# Patient Record
Sex: Female | Born: 1970
Health system: Southern US, Community
[De-identification: ages and names within clinical notes are randomized; demographics above are authoritative.]

## PROBLEM LIST (undated history)

## (undated) DIAGNOSIS — D649 Anemia, unspecified: Secondary | ICD-10-CM

## (undated) DIAGNOSIS — F32A Depression, unspecified: Secondary | ICD-10-CM

## (undated) DIAGNOSIS — J45909 Unspecified asthma, uncomplicated: Secondary | ICD-10-CM

## (undated) DIAGNOSIS — J189 Pneumonia, unspecified organism: Secondary | ICD-10-CM

## (undated) DIAGNOSIS — E119 Type 2 diabetes mellitus without complications: Secondary | ICD-10-CM

## (undated) DIAGNOSIS — J40 Bronchitis, not specified as acute or chronic: Secondary | ICD-10-CM

## (undated) DIAGNOSIS — D219 Benign neoplasm of connective and other soft tissue, unspecified: Secondary | ICD-10-CM

## (undated) DIAGNOSIS — N189 Chronic kidney disease, unspecified: Secondary | ICD-10-CM

## (undated) DIAGNOSIS — F419 Anxiety disorder, unspecified: Secondary | ICD-10-CM

## (undated) DIAGNOSIS — I10 Essential (primary) hypertension: Secondary | ICD-10-CM

## (undated) HISTORY — DX: Unspecified asthma, uncomplicated: J45.909

## (undated) HISTORY — DX: Essential (primary) hypertension: I10

## (undated) HISTORY — DX: Benign neoplasm of connective and other soft tissue, unspecified: D21.9

## (undated) HISTORY — DX: Bronchitis, not specified as acute or chronic: J40

---

## 2001-12-10 ENCOUNTER — Encounter: Payer: Self-pay | Admitting: Urology

## 2001-12-10 ENCOUNTER — Ambulatory Visit (HOSPITAL_COMMUNITY): Admission: RE | Admit: 2001-12-10 | Discharge: 2001-12-10 | Payer: Self-pay | Admitting: Urology

## 2002-05-21 ENCOUNTER — Ambulatory Visit (HOSPITAL_COMMUNITY): Admission: RE | Admit: 2002-05-21 | Discharge: 2002-05-21 | Payer: Self-pay | Admitting: Obstetrics & Gynecology

## 2002-05-21 ENCOUNTER — Encounter (INDEPENDENT_AMBULATORY_CARE_PROVIDER_SITE_OTHER): Payer: Self-pay | Admitting: *Deleted

## 2002-07-23 ENCOUNTER — Other Ambulatory Visit: Admission: RE | Admit: 2002-07-23 | Discharge: 2002-07-23 | Payer: Self-pay | Admitting: Obstetrics & Gynecology

## 2005-03-22 ENCOUNTER — Ambulatory Visit (HOSPITAL_COMMUNITY): Admission: RE | Admit: 2005-03-22 | Discharge: 2005-03-22 | Payer: Self-pay | Admitting: Family Medicine

## 2005-04-23 ENCOUNTER — Ambulatory Visit (HOSPITAL_COMMUNITY): Admission: RE | Admit: 2005-04-23 | Discharge: 2005-04-23 | Payer: Self-pay | Admitting: Family Medicine

## 2006-02-12 DIAGNOSIS — J45909 Unspecified asthma, uncomplicated: Secondary | ICD-10-CM

## 2006-02-12 HISTORY — DX: Unspecified asthma, uncomplicated: J45.909

## 2006-12-18 ENCOUNTER — Emergency Department (HOSPITAL_COMMUNITY): Admission: EM | Admit: 2006-12-18 | Discharge: 2006-12-19 | Payer: Self-pay | Admitting: *Deleted

## 2010-03-05 ENCOUNTER — Encounter: Payer: Self-pay | Admitting: Urology

## 2010-06-30 NOTE — Op Note (Signed)
   NAME:  Sandra Gray, Sandra Gray                        ACCOUNT NO.:  [id number]   MEDICAL RECORD NO.:  [id number]                   PATIENT TYPE:  AMB   LOCATION:  SDC                                  FACILITY:  [facility]   PHYSICIAN:  Genia Del, M.D.             DATE OF BIRTH:  14-May-1970   DATE OF PROCEDURE:  05/21/2002  DATE OF DISCHARGE:                                 OPERATIVE REPORT   PREOPERATIVE DIAGNOSIS:  Seven plus weeks gestation, nondesired.   POSTOPERATIVE DIAGNOSIS:  Seven plus weeks gestation, nondesired.   PROCEDURE:  Intervention, dilatation and evacuation.   SURGEON:  Genia Del, M.D.   ANESTHESIOLOGIST:  Belva Agee, M.D.   DESCRIPTION OF PROCEDURE:  Under MAC analgesia the patient was in the  lithotomy position.  She was prepped with Hibiclens on the suprapubic vulvar  and vaginal areas.  The bladder is catheterized and the patient is draped as  usual.  The vaginal exam reveals an anteverted uterus 7-[redacted] weeks gestation,  mobile, no adnexal masses.  The cervix is closed and long, no bleeding.   The speculum is inserted.  A paracervical block is done with lidocaine 1%  plain, 20 cc total at 4 and 8 o'clock.  The anterior lip of the cervix is  grasped with a tenaculum. Dilatation is achieved with Hegar up to #29  without difficulty.  We then proceeded with suction of the intrauterine  cavity with a #8 curved curette.  Products of conception corresponding to  about 7-[redacted] weeks gestation are brought out and sent to pathology.  We then  used sharp curettes to systematically curettage the entire intrauterine  cavity.  The uterine sound is heard all over.  We then used the suction  curette once more to remove any debris and any blood clots.  The uterus is  contracting very well.  Hemostasis is adequate.   We removed the instruments. Silver nitrate is applied on the anterior lip of  the cervix where the clamp was, to control minor bleeding with success.  The  uterus is contracting well on vaginal exam after the intervention.  The  estimated blood loss was less than 50 cc.  No complications occurred and the  patient was transferred to the recovery room in good status. Her blood group  is A positive.                                               Genia Del, M.D.    ML/MEDQ  D:  05/21/2002  T:  05/22/2002  Job:  [id number]

## 2010-11-21 LAB — CBC
MCHC: 32.4
MCV: 79.8
Platelets: 297
RBC: 4.89
RDW: 13.3

## 2010-11-21 LAB — BASIC METABOLIC PANEL
CO2: 23
Calcium: 9.1
Chloride: 109
Creatinine, Ser: 1.29 — ABNORMAL HIGH
GFR calc Af Amer: 58 — ABNORMAL LOW
Glucose, Bld: 97
Potassium: 3.8
Sodium: 138

## 2010-11-21 LAB — URINALYSIS, ROUTINE W REFLEX MICROSCOPIC
Ketones, ur: NEGATIVE
Nitrite: NEGATIVE
Urobilinogen, UA: 0.2
pH: 7

## 2010-11-21 LAB — DIFFERENTIAL
Eosinophils Relative: 1
Lymphs Abs: 1.6
Monocytes Absolute: 0.8 — ABNORMAL HIGH
Monocytes Relative: 6
Neutro Abs: 9.6 — ABNORMAL HIGH

## 2011-02-13 DIAGNOSIS — I10 Essential (primary) hypertension: Secondary | ICD-10-CM

## 2011-02-13 HISTORY — DX: Essential (primary) hypertension: I10

## 2011-08-13 HISTORY — PX: DILATION AND CURETTAGE OF UTERUS: SHX78

## 2011-08-21 ENCOUNTER — Ambulatory Visit: Payer: Self-pay | Admitting: Obstetrics and Gynecology

## 2011-08-21 LAB — HEMOGLOBIN: HGB: 11.9 g/dL — ABNORMAL LOW (ref 12.0–16.0)

## 2012-02-12 ENCOUNTER — Ambulatory Visit: Payer: Self-pay | Admitting: Obstetrics and Gynecology

## 2012-02-12 LAB — URINALYSIS, COMPLETE
Bilirubin,UR: NEGATIVE
Leukocyte Esterase: NEGATIVE
Ph: 5 (ref 4.5–8.0)
Protein: 30
RBC,UR: 2520 /HPF (ref 0–5)

## 2012-02-15 ENCOUNTER — Ambulatory Visit: Payer: Self-pay | Admitting: Obstetrics and Gynecology

## 2012-02-15 HISTORY — PX: HYSTEROSCOPY: SHX211

## 2012-03-26 ENCOUNTER — Encounter: Payer: Self-pay | Admitting: Adult Health

## 2012-03-26 ENCOUNTER — Ambulatory Visit (INDEPENDENT_AMBULATORY_CARE_PROVIDER_SITE_OTHER): Payer: 59 | Admitting: Adult Health

## 2012-03-26 VITALS — BP 160/99 | HR 104 | Temp 98.6°F | Resp 16 | Ht 61.75 in | Wt 201.0 lb

## 2012-03-26 DIAGNOSIS — R5381 Other malaise: Secondary | ICD-10-CM

## 2012-03-26 DIAGNOSIS — F32A Depression, unspecified: Secondary | ICD-10-CM | POA: Insufficient documentation

## 2012-03-26 DIAGNOSIS — I10 Essential (primary) hypertension: Secondary | ICD-10-CM

## 2012-03-26 DIAGNOSIS — J45909 Unspecified asthma, uncomplicated: Secondary | ICD-10-CM

## 2012-03-26 DIAGNOSIS — F329 Major depressive disorder, single episode, unspecified: Secondary | ICD-10-CM | POA: Insufficient documentation

## 2012-03-26 DIAGNOSIS — F411 Generalized anxiety disorder: Secondary | ICD-10-CM

## 2012-03-26 DIAGNOSIS — F419 Anxiety disorder, unspecified: Secondary | ICD-10-CM

## 2012-03-26 DIAGNOSIS — R5383 Other fatigue: Secondary | ICD-10-CM

## 2012-03-26 DIAGNOSIS — Z0000 Encounter for general adult medical examination without abnormal findings: Secondary | ICD-10-CM | POA: Insufficient documentation

## 2012-03-26 MED ORDER — ALBUTEROL SULFATE HFA 108 (90 BASE) MCG/ACT IN AERS: 2.0000 | INHALATION_SPRAY | Freq: Four times a day (QID) | RESPIRATORY_TRACT | Status: DC | PRN

## 2012-03-26 MED ORDER — METHYLDOPA 250 MG PO TABS: ORAL_TABLET | ORAL | Status: DC

## 2012-03-26 NOTE — Assessment & Plan Note (Signed)
Will check TSH given patient's report of fatigue. She has also reported hair loss.

## 2012-03-26 NOTE — Assessment & Plan Note (Signed)
Start methyldopa 250 mg bid. Monitor b/p x 2 weeks. RTC for f/u in 1-2 months or sooner if not well controlled.

## 2012-03-26 NOTE — Assessment & Plan Note (Signed)
Normal exam. Patient seen by GYN for breast and pelvic including PAP. Ordered labs: cbc w/diff, cmet, lipid panel, TSH (report of fatigue).

## 2012-03-26 NOTE — Progress Notes (Signed)
  Subjective:    Patient ID: Sandra Gray, female    DOB: 1970-02-23, 42 y.o.   MRN: [id number]  HPI  Ms. Sandra Sandra Batten) is a very pleasant 42 y/o female who is here to establish care. She was previously followed in [place] by Dr. Butch Penny. She is also followed by Dr. Madelin Headings (GYN). Patient reports that she was seen in Urgent Care at [facility] for Bronchitis in July 2013. Sandra Batten reports she has a hx of HTN (08/2011), asthma with bronchitis. She reports having a miscarriage in July 2013 and is s/p D&C. She also had a very large fibroid removed January 2014.  Sandra Gray reports she was started on Lisinopril 40 mg daily. She uses a rescue inhaler (albuterol) prn.  Also, Sandra Batten tells me today she is actively trying to get pregnant.  She also reports feeling very anxious, stressed. She states she has little patience with her husband.    Health Maintenance:  Tdap - received 2009  PAP - All normal. Done by Dr. Logan Bores   Review of Systems  Constitutional: Positive for fatigue. Negative for fever, chills and appetite change.  HENT: Negative.   Eyes: Negative.   Respiratory: Negative for cough, chest tightness and shortness of breath.   Cardiovascular: Negative for chest pain.  Gastrointestinal: Negative for nausea and vomiting.  Endocrine: Negative.   Genitourinary: Negative for dysuria, hematuria and pelvic pain.  Musculoskeletal: Negative.   Skin: Negative.   Allergic/Immunologic: Positive for environmental allergies.  Neurological: Negative.   Psychiatric/Behavioral: Negative for self-injury and agitation. The patient is nervous/anxious.     BP 160/99  Pulse 104  Temp(Src) 98.6 F (37 C) (Oral)  Resp 16  Ht 5' 1.75" (1.568 m)  Wt 201 lb (91.173 kg)  BMI 37.08 kg/m2  SpO2 96%  LMP 03/01/2012     Objective:   Physical Exam  Constitutional: She is oriented to person, place, and time. She appears well-developed and well-nourished. No distress.  HENT:  Head: Normocephalic  and atraumatic.  Right Ear: External ear normal.  Left Ear: External ear normal.  Nose: Nose normal.  Eyes: Conjunctivae are normal. Pupils are equal, round, and reactive to light.  Neck: Normal range of motion. No tracheal deviation present.  Cardiovascular: Normal rate, regular rhythm, normal heart sounds and intact distal pulses.   Pulmonary/Chest: Effort normal and breath sounds normal.  Abdominal: Soft. Bowel sounds are normal.  Musculoskeletal: Normal range of motion. She exhibits no edema and no tenderness.  Lymphadenopathy:    She has no cervical adenopathy.  Neurological: She is alert and oriented to person, place, and time. Coordination normal.  Skin: Skin is warm and dry.  Psychiatric: She has a normal mood and affect. Her behavior is normal. Judgment and thought content normal.          Assessment & Plan:

## 2012-03-26 NOTE — Patient Instructions (Addendum)
  Thank you for choosing [facility] for your health care needs.  I will send a prescription for b/p to your pharmacy.  Please activate you MyChart for easy access to your health care records.  Below is the activation code.

## 2012-03-26 NOTE — Assessment & Plan Note (Signed)
Well controlled. Reorder Albuterol inhaler.

## 2012-03-26 NOTE — Assessment & Plan Note (Signed)
Although use of SSRIs may be carefully used during pregnancy, patient's symptoms do not appear to be severe at this time. I would not start any medication given that she wants to get pregnant. If her symptoms worsen, I would refer to Psychiatry for closer evaluation and management during pregnancy. No meds at this time. Encourage exercise, relaxation therapy (whatever helps her relax).

## 2012-03-27 ENCOUNTER — Ambulatory Visit: Payer: Self-pay | Admitting: Adult Health

## 2012-04-03 ENCOUNTER — Ambulatory Visit: Payer: Self-pay | Admitting: Adult Health

## 2012-04-24 ENCOUNTER — Telehealth: Payer: Self-pay | Admitting: *Deleted

## 2012-04-24 NOTE — Telephone Encounter (Signed)
Pt is coming in for labs tomorrow 03.14.2014 what labs and dx would you like for this pt? Thank you

## 2012-04-25 ENCOUNTER — Other Ambulatory Visit (INDEPENDENT_AMBULATORY_CARE_PROVIDER_SITE_OTHER): Payer: 59

## 2012-04-25 ENCOUNTER — Other Ambulatory Visit: Payer: Self-pay | Admitting: *Deleted

## 2012-04-25 DIAGNOSIS — Z0000 Encounter for general adult medical examination without abnormal findings: Secondary | ICD-10-CM

## 2012-04-25 LAB — CBC WITH DIFFERENTIAL/PLATELET
Eosinophils Relative: 3 % (ref 0.0–5.0)
MCV: 78.6 fl (ref 78.0–100.0)
Monocytes Absolute: 0.8 10*3/uL (ref 0.1–1.0)
Monocytes Relative: 7.7 % (ref 3.0–12.0)
Neutrophils Relative %: 60.6 % (ref 43.0–77.0)
Platelets: 329 10*3/uL (ref 150.0–400.0)
WBC: 10.5 10*3/uL (ref 4.5–10.5)

## 2012-04-25 LAB — COMPREHENSIVE METABOLIC PANEL
Albumin: 3.5 g/dL (ref 3.5–5.2)
Alkaline Phosphatase: 62 U/L (ref 39–117)
BUN: 9 mg/dL (ref 6–23)
CO2: 26 mEq/L (ref 19–32)
Calcium: 8.9 mg/dL (ref 8.4–10.5)
GFR: 62.99 mL/min (ref >60.00)
Glucose, Bld: 99 mg/dL (ref 70–99)
Potassium: 3.8 mEq/L (ref 3.5–5.1)

## 2012-04-25 LAB — LIPID PANEL
Cholesterol: 191 mg/dL (ref 0–200)
VLDL: 32.4 mg/dL (ref 0.0–40.0)

## 2012-04-25 LAB — TSH: TSH: 2.89 u[IU]/mL (ref 0.35–5.50)

## 2012-04-27 ENCOUNTER — Other Ambulatory Visit: Payer: Self-pay | Admitting: Adult Health

## 2012-04-27 DIAGNOSIS — R899 Unspecified abnormal finding in specimens from other organs, systems and tissues: Secondary | ICD-10-CM

## 2012-05-02 ENCOUNTER — Encounter: Payer: Self-pay | Admitting: Adult Health

## 2012-05-02 ENCOUNTER — Telehealth: Payer: Self-pay | Admitting: Adult Health

## 2012-05-02 NOTE — Telephone Encounter (Signed)
Patient wants something called into the pharmacy.

## 2012-05-02 NOTE — Telephone Encounter (Signed)
Patient Information:  Caller Name: Nelly  Phone: [phone]  Patient: Sandra Gray, Sandra Gray  Gender: Female  DOB: 11-Jan-1971  Age: 42 Years  PCP: Orville Govern  Pregnant: No  Office Follow Up:  Does the office need to follow up with this patient?: Yes  Instructions For The Office: DISPOSITION OBTAINED OF " GO TO OFFICE NOW"  NO APPTS. AVAILABLE. PLEASE RETURN CALL TO PATIENT AT [phone] FOR POSSIBLE WORK IN APPT. VS. PATIENT NEEDING TO GO TO UC/ED FOR EVALUATION.  RN Note:  Patient states she developed pain in left buttock, radiating down left leg, onset 04/30/12. Denies numbness or tingling. Patient states pain is severe. States pain increases with sitting. Patient states pain is currently at a "10" on 1-10 scale. Patient last took Tylenol 650mg . at 0750 05/02/12. Care advice given per guidelines. Patient advised may take Ibuprofen 600mg ., with food, starting at 1150 05/02/12 and take q 6 hours as needed for pain. Patient advised to change positions slowly. Advised to avoid driving. Call back parameters reviewed. Patient verbalizes understanding. No appts. available in Epic Electronic Health Record.  DISPOSITION OBTAINED OF " GO TO OFFICE NOW"  NO APPTS. AVAILABLE. PLEASE RETURN CALL TO PATIENT AT [phone] FOR POSSIBLE WORK IN APPT. VS. PATIENT NEEDING TO GO TO UC/ED FOR EVALUATION.  Symptoms  Reason For Call & Symptoms: Left lower back pain radiating down hip and left leg Hx: HTN  Reviewed Health History In EMR: Yes  Reviewed Medications In EMR: Yes  Reviewed Allergies In EMR: Yes  Reviewed Surgeries / Procedures: Yes  Date of Onset of Symptoms: 04/30/2012  Treatments Tried: Tylenol  Treatments Tried Worked: No OB / GYN:  LMP: 04/23/2012  Guideline(s) Used:  Back Pain  Disposition Per Guideline:   Go to Office Now  Reason For Disposition Reached:   Severe back pain  Advice Given:  Cold or Heat:  Heat Pack: If pain lasts over 2 days, apply heat to the sore area.  Use a heat pack, heating pad, or warm wet washcloth. Do this for 10 minutes, then as needed. For widespread stiffness, take a hot bath or hot shower instead. Move the sore area under the warm water.  Activity  Keep doing your day-to-day activities if it is not too painful. Staying active is better than resting.  Call Back If:  Numbness or weakness occur  Bowel/bladder problems occur  You become worse.  Call Back If:  You have more questions  You become worse.  Patient Will Follow Care Advice:  YES

## 2012-05-02 NOTE — Telephone Encounter (Signed)
Patient declined a 3:45 appointment that was offered to her today and decided to go to urgent care.

## 2012-05-13 ENCOUNTER — Other Ambulatory Visit: Payer: Self-pay | Admitting: *Deleted

## 2012-05-13 MED ORDER — METHYLDOPA 250 MG PO TABS: ORAL_TABLET | ORAL | Status: DC

## 2012-05-13 NOTE — Telephone Encounter (Signed)
Sandra Gray,  Patient is seeking a refill;Please advise  Asher Muir

## 2012-05-26 ENCOUNTER — Telehealth: Payer: Self-pay | Admitting: Adult Health

## 2012-05-26 MED ORDER — METHYLDOPA 250 MG PO TABS: ORAL_TABLET | ORAL | Status: DC

## 2012-05-26 NOTE — Telephone Encounter (Signed)
Med filled.  

## 2012-05-26 NOTE — Telephone Encounter (Signed)
Ok to refill her Aldomet.

## 2012-05-26 NOTE — Telephone Encounter (Signed)
Patient needing her prescription sent to [facility]   methyldopa (ALDOMET) 250 MG tablet   # 90

## 2012-06-03 ENCOUNTER — Other Ambulatory Visit: Payer: 59

## 2012-06-12 HISTORY — PX: HYSTEROSCOPY: SHX211

## 2012-06-27 ENCOUNTER — Telehealth: Payer: Self-pay | Admitting: Adult Health

## 2012-06-27 NOTE — Telephone Encounter (Signed)
methyldopa (ALDOMET) 250 MG tablet  #90

## 2012-06-30 MED ORDER — METHYLDOPA 250 MG PO TABS: ORAL_TABLET | ORAL | Status: DC

## 2012-06-30 NOTE — Telephone Encounter (Signed)
Rx sent to pharmacy by escript. Called and left message for pt, that she is due for a followup on hypertension and to call back to schedule.

## 2012-07-03 ENCOUNTER — Ambulatory Visit: Payer: Self-pay | Admitting: Obstetrics and Gynecology

## 2012-07-03 ENCOUNTER — Telehealth: Payer: Self-pay | Admitting: Adult Health

## 2012-07-03 NOTE — Telephone Encounter (Signed)
FYI  Pt called stated she just left hospital for pre op she has surgery on 5/30.  She had her ekg and labs drawn today @ [facility].

## 2012-07-03 NOTE — Telephone Encounter (Signed)
FYI

## 2012-07-11 ENCOUNTER — Ambulatory Visit: Payer: Self-pay | Admitting: Obstetrics and Gynecology

## 2012-12-12 ENCOUNTER — Encounter: Payer: Self-pay | Admitting: Adult Health

## 2012-12-12 ENCOUNTER — Ambulatory Visit (INDEPENDENT_AMBULATORY_CARE_PROVIDER_SITE_OTHER): Payer: 59 | Admitting: Adult Health

## 2012-12-12 ENCOUNTER — Encounter (INDEPENDENT_AMBULATORY_CARE_PROVIDER_SITE_OTHER): Payer: Self-pay

## 2012-12-12 VITALS — BP 146/82 | HR 87 | Temp 98.2°F | Resp 12 | Wt 208.0 lb

## 2012-12-12 DIAGNOSIS — I10 Essential (primary) hypertension: Secondary | ICD-10-CM

## 2012-12-12 DIAGNOSIS — J45909 Unspecified asthma, uncomplicated: Secondary | ICD-10-CM

## 2012-12-12 DIAGNOSIS — F419 Anxiety disorder, unspecified: Secondary | ICD-10-CM

## 2012-12-12 DIAGNOSIS — F411 Generalized anxiety disorder: Secondary | ICD-10-CM

## 2012-12-12 MED ORDER — ALPRAZOLAM 0.25 MG PO TABS: ORAL_TABLET | ORAL | Status: DC

## 2012-12-12 MED ORDER — METHYLDOPA 250 MG PO TABS: ORAL_TABLET | ORAL | Status: DC

## 2012-12-12 NOTE — Progress Notes (Signed)
  Subjective:    Patient ID: Sandra Gray, female    DOB: 02/07/1971, 42 y.o.   MRN: [id number]  HPI  Pt is a 42 y/o female who presents for medication management and refills. Also for follow up on HTN, anxiety and asthma. Symptoms of asthma have been well controlled. Not requiring use of inhaler. As far as her HTN, she is on aldomet but is not consistent with taking medication. States she is under a lot of stress and often forgets. Anxiety seems to be worse some days. Work is very stressful now.    Current Outpatient Prescriptions on File Prior to Visit  Medication Sig Dispense Refill  . albuterol (PROVENTIL HFA;VENTOLIN HFA) 108 (90 BASE) MCG/ACT inhaler Inhale 2 puffs into the lungs every 6 (six) hours as needed for wheezing.  1 Inhaler  2   No current facility-administered medications on file prior to visit.      Review of Systems  Constitutional: Negative.   Respiratory: Negative.   Cardiovascular: Negative.   Gastrointestinal: Positive for constipation.  Genitourinary: Negative.   Neurological: Negative.   Psychiatric/Behavioral: The patient is nervous/anxious.   All other systems reviewed and are negative.       Objective:   Physical Exam  Constitutional: She is oriented to person, place, and time. No distress.  Cardiovascular: Normal rate, regular rhythm and intact distal pulses.  Exam reveals no gallop.   No murmur heard. Pulmonary/Chest: Effort normal and breath sounds normal. No respiratory distress. She has no wheezes. She has no rales.  Abdominal: Soft. Bowel sounds are normal. She exhibits no distension and no mass. There is no tenderness. There is no rebound and no guarding.  Musculoskeletal: Normal range of motion.  Neurological: She is alert and oriented to person, place, and time.  Skin: Skin is warm and dry.  Psychiatric: She has a normal mood and affect. Her behavior is normal. Judgment and thought content normal.          Assessment & Plan:

## 2012-12-12 NOTE — Assessment & Plan Note (Signed)
Not taking medication regularly. Discussed importance of taking her b/p medication. Continue to follow.

## 2012-12-12 NOTE — Assessment & Plan Note (Addendum)
Work stress and still some family stress. Discussed finding ways to deal with stress such as going for walk, praying, exercising. Add xanax 0.25 mg daily prn.

## 2012-12-12 NOTE — Assessment & Plan Note (Signed)
Well controlled 

## 2012-12-12 NOTE — Patient Instructions (Signed)
  I have sent in your prescription for Aldomet to your mail order pharmacy.  I have sent in a prescription for xanax 0.25 mg to [facility].  Please call with any questions or concerns.  I would like to see you in February or March for your physical.

## 2012-12-15 ENCOUNTER — Ambulatory Visit: Payer: 59 | Admitting: Adult Health

## 2013-03-27 ENCOUNTER — Encounter: Payer: 59 | Admitting: Adult Health

## 2013-03-31 ENCOUNTER — Encounter: Payer: Self-pay | Admitting: Obstetrics and Gynecology

## 2013-05-15 ENCOUNTER — Other Ambulatory Visit: Payer: Self-pay | Admitting: Adult Health

## 2013-11-30 ENCOUNTER — Encounter: Payer: Self-pay | Admitting: Internal Medicine

## 2013-11-30 ENCOUNTER — Ambulatory Visit (INDEPENDENT_AMBULATORY_CARE_PROVIDER_SITE_OTHER): Payer: 59 | Admitting: Internal Medicine

## 2013-11-30 VITALS — BP 140/82 | HR 102 | Temp 98.2°F | Ht 61.75 in | Wt 198.5 lb

## 2013-11-30 DIAGNOSIS — R7309 Other abnormal glucose: Secondary | ICD-10-CM

## 2013-11-30 DIAGNOSIS — E669 Obesity, unspecified: Secondary | ICD-10-CM

## 2013-11-30 DIAGNOSIS — I10 Essential (primary) hypertension: Secondary | ICD-10-CM

## 2013-11-30 DIAGNOSIS — R739 Hyperglycemia, unspecified: Secondary | ICD-10-CM

## 2013-11-30 MED ORDER — AMLODIPINE BESYLATE 5 MG PO TABS: 5.0000 mg | ORAL_TABLET | Freq: Every day | ORAL | Status: DC

## 2013-11-30 NOTE — Progress Notes (Signed)
Pre visit review using our clinic review tool, if applicable. No additional management support is needed unless otherwise documented below in the visit note. 

## 2013-11-30 NOTE — Assessment & Plan Note (Signed)
Will plan to repeat A1c with labs in 01/2014.

## 2013-11-30 NOTE — Assessment & Plan Note (Signed)
BP Readings from Last 3 Encounters:  11/30/13 140/82  12/12/12 146/82  03/26/12 160/99   BP elevated today. Will stop Methyldopa and start Amlodipine 5mg  daily. Follow up in 4 weeks to recheck BP.

## 2013-11-30 NOTE — Progress Notes (Signed)
Subjective:    Patient ID: Sandra Gray, female    DOB: Sep 19, 1970, 43 y.o.   MRN: [id number]  HPI 43YO female presents for acute visit. Previously seen by [employer] [facility].  HTN - Would like to change BP medication, as no longer trying to conceive.  Obesity - BMI 38.3 on screening at [place]. Also noted to have A1c 6.3%. Told she needs to set up plan to get BMI below 30. Trying to increase exercise by walking. Considering various diet options.  Review of Systems  Constitutional: Negative for fever, chills, appetite change, fatigue and unexpected weight change.  Eyes: Negative for visual disturbance.  Respiratory: Negative for shortness of breath.   Cardiovascular: Negative for chest pain, palpitations and leg swelling.  Gastrointestinal: Negative for nausea, vomiting, abdominal pain, diarrhea and constipation.  Musculoskeletal: Negative for arthralgias and myalgias.  Skin: Negative for color change and rash.  Neurological: Negative for headaches.  Hematological: Negative for adenopathy. Does not bruise/bleed easily.  Psychiatric/Behavioral: Negative for dysphoric mood. The patient is not nervous/anxious.        Objective:    BP 140/82  Pulse 102  Temp(Src) 98.2 F (36.8 C) (Oral)  Ht 5' 1.75" (1.568 m)  Wt 198 lb 8 oz (90.039 kg)  BMI 36.62 kg/m2  SpO2 98% Physical Exam  Constitutional: She is oriented to person, place, and time. She appears well-developed and well-nourished. No distress.  HENT:  Head: Normocephalic and atraumatic.  Right Ear: External ear normal.  Left Ear: External ear normal.  Nose: Nose normal.  Mouth/Throat: Oropharynx is clear and moist. No oropharyngeal exudate.  Eyes: Conjunctivae are normal. Pupils are equal, round, and reactive to light. Right eye exhibits no discharge. Left eye exhibits no discharge. No scleral icterus.  Neck: Normal range of motion. Neck supple. No tracheal deviation present. No thyromegaly present.  Cardiovascular: Normal  rate, regular rhythm, normal heart sounds and intact distal pulses.  Exam reveals no gallop and no friction rub.   No murmur heard. Pulmonary/Chest: Effort normal and breath sounds normal. No accessory muscle usage. Not tachypneic. No respiratory distress. She has no decreased breath sounds. She has no wheezes. She has no rhonchi. She has no rales. She exhibits no tenderness.  Musculoskeletal: Normal range of motion. She exhibits no edema and no tenderness.  Lymphadenopathy:    She has no cervical adenopathy.  Neurological: She is alert and oriented to person, place, and time. No cranial nerve deficit. She exhibits normal muscle tone. Coordination normal.  Skin: Skin is warm and dry. No rash noted. She is not diaphoretic. No erythema. No pallor.  Psychiatric: She has a normal mood and affect. Her behavior is normal. Judgment and thought content normal.          Assessment & Plan:   Problem List Items Addressed This Visit     Unprioritized   Elevated blood sugar     Will plan to repeat A1c with labs in 01/2014.    Hypertension - Primary      BP Readings from Last 3 Encounters:  11/30/13 140/82  12/12/12 146/82  03/26/12 160/99   BP elevated today. Will stop Methyldopa and start Amlodipine 5mg  daily. Follow up in 4 weeks to recheck BP.    Relevant Medications      amLODIpine (NORVASC) tablet   Obesity (BMI 30-39.9)      Wt Readings from Last 3 Encounters:  11/30/13 198 lb 8 oz (90.039 kg)  12/12/12 208 lb (94.348 kg)  03/26/12 201 lb (  91.173 kg)   Body mass index is 36.62 kg/(m^2). Encouraged healthy diet and exercise. Discussed using MyFitness Pal and Weight Watchers. Encouraged setting a goal of exercise 31min 3x per week. Paperwork filled out for [facility].        Return in about 4 weeks (around 12/28/2013) for Recheck.

## 2013-11-30 NOTE — Assessment & Plan Note (Addendum)
Wt Readings from Last 3 Encounters:  11/30/13 198 lb 8 oz (90.039 kg)  12/12/12 208 lb (94.348 kg)  03/26/12 201 lb (91.173 kg)   Body mass index is 36.62 kg/(m^2). Encouraged healthy diet and exercise. Discussed using MyFitness Pal and Weight Watchers. Encouraged setting a goal of exercise 26min 3x per week. Paperwork filled out for [facility].

## 2013-11-30 NOTE — Patient Instructions (Addendum)
Stop Methyldopa.  Start Amlodipine 5 mg daily.  Monitor blood pressure periodically and email with readings.  Consider starting MyFitness Pal or Weight Watchers. Set a goal of exercising by 25min 3 times per week.

## 2013-12-01 ENCOUNTER — Telehealth: Payer: Self-pay | Admitting: Adult Health

## 2013-12-01 NOTE — Telephone Encounter (Signed)
emmi mailed  °

## 2013-12-28 ENCOUNTER — Ambulatory Visit (INDEPENDENT_AMBULATORY_CARE_PROVIDER_SITE_OTHER): Payer: 59 | Admitting: Internal Medicine

## 2013-12-28 ENCOUNTER — Encounter: Payer: Self-pay | Admitting: Internal Medicine

## 2013-12-28 VITALS — BP 140/85 | HR 106 | Temp 99.1°F | Ht 61.75 in | Wt 199.0 lb

## 2013-12-28 DIAGNOSIS — I10 Essential (primary) hypertension: Secondary | ICD-10-CM

## 2013-12-28 DIAGNOSIS — D259 Leiomyoma of uterus, unspecified: Secondary | ICD-10-CM

## 2013-12-28 MED ORDER — AMLODIPINE BESYLATE 5 MG PO TABS: 5.0000 mg | ORAL_TABLET | Freq: Every day | ORAL | Status: DC

## 2013-12-28 NOTE — Assessment & Plan Note (Signed)
H/o uterine fibroids. Encouraged follow up with GYN.

## 2013-12-28 NOTE — Progress Notes (Signed)
   Subjective:    Patient ID: Sandra Gray, female    DOB: 1970-02-19, 43 y.o.   MRN: [id number]  HPI 43YO female presents for follow up.  HTN - No headache, chest pain, palpitations. Changed to Amlodipine. Has missed a couple of doses. Prefers not to change medication at this point.  Has some chronic issues with uterine fibroids. No bleeding over last 3 weeks. Planning to follow up with GYN, Dr. Glo Herring.  Wt Readings from Last 3 Encounters:  12/28/13 199 lb (90.266 kg)  11/30/13 198 lb 8 oz (90.039 kg)  12/12/12 208 lb (94.348 kg)     Review of Systems  Constitutional: Negative for fever, chills, appetite change, fatigue and unexpected weight change.  Eyes: Negative for visual disturbance.  Respiratory: Negative for shortness of breath.   Cardiovascular: Negative for chest pain, palpitations and leg swelling.  Gastrointestinal: Negative for abdominal pain.  Skin: Negative for color change and rash.  Hematological: Negative for adenopathy. Does not bruise/bleed easily.  Psychiatric/Behavioral: Negative for dysphoric mood. The patient is not nervous/anxious.        Objective:    BP 140/85 mmHg  Pulse 106  Temp(Src) 99.1 F (37.3 C) (Oral)  Ht 5' 1.75" (1.568 m)  Wt 199 lb (90.266 kg)  BMI 36.71 kg/m2  SpO2 98% Physical Exam  Constitutional: She is oriented to person, place, and time. She appears well-developed and well-nourished. No distress.  HENT:  Head: Normocephalic and atraumatic.  Right Ear: External ear normal.  Left Ear: External ear normal.  Nose: Nose normal.  Mouth/Throat: Oropharynx is clear and moist. No oropharyngeal exudate.  Eyes: Conjunctivae are normal. Pupils are equal, round, and reactive to light. Right eye exhibits no discharge. Left eye exhibits no discharge. No scleral icterus.  Neck: Normal range of motion. Neck supple. No tracheal deviation present. No thyromegaly present.  Cardiovascular: Normal rate, regular rhythm, normal heart sounds  and intact distal pulses.  Exam reveals no gallop and no friction rub.   No murmur heard. Pulmonary/Chest: Effort normal and breath sounds normal. No accessory muscle usage. No tachypnea. No respiratory distress. She has no decreased breath sounds. She has no wheezes. She has no rhonchi. She has no rales. She exhibits no tenderness.  Musculoskeletal: Normal range of motion. She exhibits no edema or tenderness.  Lymphadenopathy:    She has no cervical adenopathy.  Neurological: She is alert and oriented to person, place, and time. No cranial nerve deficit. She exhibits normal muscle tone. Coordination normal.  Skin: Skin is warm and dry. No rash noted. She is not diaphoretic. No erythema. No pallor.  Psychiatric: She has a normal mood and affect. Her behavior is normal. Judgment and thought content normal.          Assessment & Plan:   Problem List Items Addressed This Visit      Unprioritized   Hypertension - Primary    BP Readings from Last 3 Encounters:  12/28/13 140/85  11/30/13 140/82  12/12/12 146/82   BP Slightly elevated today, but pt reports well controlled at home. Will continue current dose of Amlodipine for now. Follow up in 2-3 months for recheck.    Relevant Medications      amLODIpine (NORVASC) tablet   Uterine fibroid    H/o uterine fibroids. Encouraged follow up with GYN.        Return in about 3 months (around 03/30/2014).

## 2013-12-28 NOTE — Progress Notes (Signed)
Pre visit review using our clinic review tool, if applicable. No additional management support is needed unless otherwise documented below in the visit note. 

## 2013-12-28 NOTE — Assessment & Plan Note (Signed)
BP Readings from Last 3 Encounters:  12/28/13 140/85  11/30/13 140/82  12/12/12 146/82   BP Slightly elevated today, but pt reports well controlled at home. Will continue current dose of Amlodipine for now. Follow up in 2-3 months for recheck.

## 2013-12-28 NOTE — Patient Instructions (Signed)
Follow up in 3 months

## 2014-03-30 ENCOUNTER — Ambulatory Visit: Payer: 59 | Admitting: Internal Medicine

## 2014-05-05 ENCOUNTER — Ambulatory Visit (INDEPENDENT_AMBULATORY_CARE_PROVIDER_SITE_OTHER): Payer: Self-pay | Admitting: Internal Medicine

## 2014-05-05 ENCOUNTER — Encounter: Payer: Self-pay | Admitting: Internal Medicine

## 2014-05-05 VITALS — BP 125/82 | HR 92 | Temp 98.7°F | Ht 61.75 in | Wt 198.0 lb

## 2014-05-05 DIAGNOSIS — D259 Leiomyoma of uterus, unspecified: Secondary | ICD-10-CM

## 2014-05-05 DIAGNOSIS — F419 Anxiety disorder, unspecified: Secondary | ICD-10-CM

## 2014-05-05 DIAGNOSIS — I10 Essential (primary) hypertension: Secondary | ICD-10-CM

## 2014-05-05 LAB — COMPREHENSIVE METABOLIC PANEL
ALBUMIN: 3.7 g/dL (ref 3.5–5.2)
ALT: 12 U/L (ref 0–35)
AST: 13 U/L (ref 0–37)
Alkaline Phosphatase: 58 U/L (ref 39–117)
BUN: 11 mg/dL (ref 6–23)
CALCIUM: 9.3 mg/dL (ref 8.4–10.5)
CHLORIDE: 106 meq/L (ref 96–112)
CO2: 26 mEq/L (ref 19–32)
Creatinine, Ser: 1.29 mg/dL — ABNORMAL HIGH (ref 0.40–1.20)
GFR: 58.47 mL/min — ABNORMAL LOW (ref >60.00)
Glucose, Bld: 100 mg/dL — ABNORMAL HIGH (ref 70–99)
POTASSIUM: 3.9 meq/L (ref 3.5–5.1)
Sodium: 137 mEq/L (ref 135–145)
Total Bilirubin: 0.3 mg/dL (ref 0.2–1.2)
Total Protein: 7.2 g/dL (ref 6.0–8.3)

## 2014-05-05 LAB — MICROALBUMIN / CREATININE URINE RATIO
CREATININE, U: 351.9 mg/dL
Microalb Creat Ratio: 0.5 mg/g (ref 0.0–30.0)
Microalb, Ur: 1.8 mg/dL (ref 0.0–1.9)

## 2014-05-05 LAB — CBC WITH DIFFERENTIAL/PLATELET
BASOS PCT: 0.4 % (ref 0.0–3.0)
Basophils Absolute: 0.1 10*3/uL (ref 0.0–0.1)
EOS PCT: 0.9 % (ref 0.0–5.0)
Eosinophils Absolute: 0.1 10*3/uL (ref 0.0–0.7)
HEMATOCRIT: 34.7 % — AB (ref 36.0–46.0)
HEMOGLOBIN: 11.1 g/dL — AB (ref 12.0–15.0)
Lymphocytes Relative: 20.8 % (ref 12.0–46.0)
Lymphs Abs: 2.5 10*3/uL (ref 0.7–4.0)
MCHC: 31.9 g/dL (ref 30.0–36.0)
MCV: 74.4 fl — AB (ref 78.0–100.0)
MONO ABS: 1 10*3/uL (ref 0.1–1.0)
Monocytes Relative: 8.1 % (ref 3.0–12.0)
NEUTROS ABS: 8.5 10*3/uL — AB (ref 1.4–7.7)
NEUTROS PCT: 69.8 % (ref 43.0–77.0)
Platelets: 375 10*3/uL (ref 150.0–400.0)
RBC: 4.67 Mil/uL (ref 3.87–5.11)
RDW: 15.7 % — ABNORMAL HIGH (ref 11.5–15.5)
WBC: 12.1 10*3/uL — AB (ref 4.0–10.5)

## 2014-05-05 MED ORDER — ALBUTEROL SULFATE HFA 108 (90 BASE) MCG/ACT IN AERS: 2.0000 | INHALATION_SPRAY | Freq: Four times a day (QID) | RESPIRATORY_TRACT | Status: DC | PRN

## 2014-05-05 MED ORDER — ALPRAZOLAM 0.25 MG PO TABS: 0.2500 mg | ORAL_TABLET | Freq: Every day | ORAL | Status: DC | PRN

## 2014-05-05 NOTE — Assessment & Plan Note (Signed)
Encouraged her to seek counseling. She declines referral for now. Will continue prn Alprazolam. She will keep this locked and away from others.

## 2014-05-05 NOTE — Progress Notes (Signed)
Pre visit review using our clinic review tool, if applicable. No additional management support is needed unless otherwise documented below in the visit note. 

## 2014-05-05 NOTE — Assessment & Plan Note (Signed)
Persistent menorrhagia with uterine fibroid. PT will set up follow up with Dr. Glo Herring to discuss options for hysterectomy.

## 2014-05-05 NOTE — Progress Notes (Signed)
Subjective:    Patient ID: Sandra Gray, female    DOB: [birth year], 44 y.o.   MRN: [id number]  HPI  44YO female presents for follow.  Anxiety - difficult time for her. Husband addicted to alcohol and drugs. Considering asking her husband to leave. Her last Rx for Alprazolam went "missing." She is concerned husband may have taken it.  HTN - BP has been well controlled. Compliant with medication.  Menorrhagia - Spotting between menses and occasionally heavy bleeding. Plans to contact OB to discuss hysterectomy. Has known uterine fibroid.  Past medical, surgical, family and social history per today's encounter.  Review of Systems  Constitutional: Negative for fever, chills, appetite change, fatigue and unexpected weight change.  Eyes: Negative for visual disturbance.  Respiratory: Negative for shortness of breath.   Cardiovascular: Negative for chest pain and leg swelling.  Gastrointestinal: Negative for nausea, vomiting, abdominal pain, diarrhea and constipation.  Musculoskeletal: Negative for myalgias and arthralgias.  Skin: Negative for color change and rash.  Hematological: Negative for adenopathy. Does not bruise/bleed easily.  Psychiatric/Behavioral: Positive for sleep disturbance and dysphoric mood. The patient is nervous/anxious.        Objective:    BP 125/82 mmHg  Pulse 92  Temp(Src) 98.7 F (37.1 C) (Oral)  Ht 5' 1.75" (1.568 m)  Wt 198 lb (89.812 kg)  BMI 36.53 kg/m2  SpO2 98%  LMP 04/19/2014 Physical Exam  Constitutional: She is oriented to person, place, and time. She appears well-developed and well-nourished. No distress.  HENT:  Head: Normocephalic and atraumatic.  Right Ear: External ear normal.  Left Ear: External ear normal.  Nose: Nose normal.  Mouth/Throat: Oropharynx is clear and moist. No oropharyngeal exudate.  Eyes: Conjunctivae are normal. Pupils are equal, round, and reactive to light. Right eye exhibits no discharge. Left eye exhibits no  discharge. No scleral icterus.  Neck: Normal range of motion. Neck supple. No tracheal deviation present. No thyromegaly present.  Cardiovascular: Normal rate, regular rhythm, normal heart sounds and intact distal pulses.  Exam reveals no gallop and no friction rub.   No murmur heard. Pulmonary/Chest: Effort normal and breath sounds normal. No respiratory distress. She has no wheezes. She has no rales. She exhibits no tenderness.  Musculoskeletal: Normal range of motion. She exhibits no edema or tenderness.  Lymphadenopathy:    She has no cervical adenopathy.  Neurological: She is alert and oriented to person, place, and time. No cranial nerve deficit. She exhibits normal muscle tone. Coordination normal.  Skin: Skin is warm and dry. No rash noted. She is not diaphoretic. No erythema. No pallor.  Psychiatric: Her speech is normal and behavior is normal. Judgment and thought content normal. Her mood appears anxious. Cognition and memory are normal. She exhibits a depressed mood. She expresses no suicidal ideation.          Assessment & Plan:   Problem List Items Addressed This Visit      Unprioritized   Anxiety, mild    Encouraged her to seek counseling. She declines referral for now. Will continue prn Alprazolam. She will keep this locked and away from others.      Relevant Medications   ALPRAZolam  (XANAX) tablet   Hypertension - Primary    BP Readings from Last 3 Encounters:  05/05/14 125/82  12/28/13 140/85  11/30/13 140/82   BP well controlled on Amlodipine. Will continue. Renal function with labs.      Relevant Orders   Comprehensive metabolic panel   Microalbumin /  creatinine urine ratio   Uterine fibroid    Persistent menorrhagia with uterine fibroid. PT will set up follow up with Dr. Glo Herring to discuss options for hysterectomy.      Relevant Orders   CBC with Differential/Platelet       Return in about 6 months (around 11/05/2014) for Physical.

## 2014-05-05 NOTE — Patient Instructions (Signed)
Labs today.  Follow up in 6 months for physical exam and sooner as needed.

## 2014-05-05 NOTE — Assessment & Plan Note (Signed)
BP Readings from Last 3 Encounters:  05/05/14 125/82  12/28/13 140/85  11/30/13 140/82   BP well controlled on Amlodipine. Will continue. Renal function with labs.

## 2014-05-13 ENCOUNTER — Encounter: Payer: Self-pay | Admitting: *Deleted

## 2014-05-19 ENCOUNTER — Telehealth: Payer: Self-pay | Admitting: Internal Medicine

## 2014-05-19 NOTE — Telephone Encounter (Signed)
Notified pt, Pt states:  "No ma'am. I want a hysterectomy when I have the time. Could you call that prescription in please? Thanks"    Last Rx for Lisinopril was for 10mg  daily, send new prescription for the same?

## 2014-05-19 NOTE — Telephone Encounter (Signed)
There was an error in my telephone note regarding her labs. I wanted her to consider going back on Lisinopril to protect her kidney function. However if she is considering becoming pregnant in the future, we should not restart.

## 2014-05-19 NOTE — Telephone Encounter (Signed)
Let's start with Lisinopril 5mg  daily #30 with 3 refills. She will need to have BMP 1 week after starting.

## 2014-05-20 ENCOUNTER — Other Ambulatory Visit: Payer: Self-pay | Admitting: *Deleted

## 2014-05-20 MED ORDER — LISINOPRIL 5 MG PO TABS: 5.0000 mg | ORAL_TABLET | Freq: Every day | ORAL | Status: DC

## 2014-05-20 NOTE — Telephone Encounter (Signed)
Notified pt and sent Rx to pharmacy

## 2014-05-20 NOTE — Telephone Encounter (Signed)
Yes, continue amlodipine

## 2014-05-20 NOTE — Telephone Encounter (Signed)
Should pt continue also taking  amLODipine (NORVASC) 5 MG tablet

## 2014-05-27 ENCOUNTER — Other Ambulatory Visit: Payer: Self-pay | Admitting: *Deleted

## 2014-05-27 ENCOUNTER — Telehealth: Payer: Self-pay | Admitting: *Deleted

## 2014-05-27 DIAGNOSIS — I10 Essential (primary) hypertension: Secondary | ICD-10-CM

## 2014-05-27 DIAGNOSIS — D649 Anemia, unspecified: Secondary | ICD-10-CM

## 2014-05-27 NOTE — Telephone Encounter (Signed)
BMP for hypertension CBC for anemia

## 2014-05-27 NOTE — Telephone Encounter (Signed)
Pt coming in tomorrow what labs and dx?  

## 2014-05-28 ENCOUNTER — Telehealth: Payer: Self-pay | Admitting: *Deleted

## 2014-05-28 ENCOUNTER — Other Ambulatory Visit (INDEPENDENT_AMBULATORY_CARE_PROVIDER_SITE_OTHER): Payer: 59

## 2014-05-28 DIAGNOSIS — I10 Essential (primary) hypertension: Secondary | ICD-10-CM

## 2014-05-28 DIAGNOSIS — D649 Anemia, unspecified: Secondary | ICD-10-CM | POA: Diagnosis not present

## 2014-05-28 LAB — BASIC METABOLIC PANEL
BUN: 10 mg/dL (ref 6–23)
CHLORIDE: 104 meq/L (ref 96–112)
CO2: 28 meq/L (ref 19–32)
Calcium: 9.6 mg/dL (ref 8.4–10.5)
Creatinine, Ser: 1.28 mg/dL — ABNORMAL HIGH (ref 0.40–1.20)
GFR: 58.98 mL/min — AB (ref >60.00)
GLUCOSE: 87 mg/dL (ref 70–99)
POTASSIUM: 3.7 meq/L (ref 3.5–5.1)
SODIUM: 137 meq/L (ref 135–145)

## 2014-05-28 LAB — CBC WITH DIFFERENTIAL/PLATELET
BASOS ABS: 0.1 10*3/uL (ref 0.0–0.1)
BASOS PCT: 0.6 % (ref 0.0–3.0)
EOS ABS: 0.2 10*3/uL (ref 0.0–0.7)
Eosinophils Relative: 2 % (ref 0.0–5.0)
HCT: 36.2 % (ref 36.0–46.0)
Hemoglobin: 11.5 g/dL — ABNORMAL LOW (ref 12.0–15.0)
LYMPHS PCT: 27.9 % (ref 12.0–46.0)
Lymphs Abs: 2.9 10*3/uL (ref 0.7–4.0)
MCHC: 31.8 g/dL (ref 30.0–36.0)
MCV: 73.5 fl — ABNORMAL LOW (ref 78.0–100.0)
MONO ABS: 0.8 10*3/uL (ref 0.1–1.0)
MONOS PCT: 7.9 % (ref 3.0–12.0)
NEUTROS PCT: 61.6 % (ref 43.0–77.0)
Neutro Abs: 6.3 10*3/uL (ref 1.4–7.7)
Platelets: 388 10*3/uL (ref 150.0–400.0)
RBC: 4.92 Mil/uL (ref 3.87–5.11)
RDW: 15.5 % (ref 11.5–15.5)
WBC: 10.3 10*3/uL (ref 4.0–10.5)

## 2014-05-28 MED ORDER — AMLODIPINE BESYLATE 5 MG PO TABS: 5.0000 mg | ORAL_TABLET | Freq: Every day | ORAL | Status: DC

## 2014-05-28 NOTE — Telephone Encounter (Signed)
BP 120/80 in office & Amlodipine refill sent to pharmacy

## 2014-06-04 NOTE — Op Note (Signed)
PATIENT NAME:  Sandra Gray, Sandra Gray MR#:  [id number] DATE OF BIRTH:  March 13, 1970  DATE OF PROCEDURE:  07/11/2012  PREOPERATIVE DIAGNOSIS:  Symptomatic fibroid.  POSTOPERATIVE DIAGNOSIS:  Symptomatic fibroid.   PROCEDURE:  Hysteroscopically-guided resection of fibroid.   SURGEON:  Ricky L. Amalia Hailey, M.D.   ANESTHESIA:  LMA.   FINDINGS:  Large posterior left uterine fibroid.  Excellent hemostasis, cosmesis.   ESTIMATED BLOOD LOSS:  200.    FLUID DEFICIT:  Was approximately 400, but there was significant spillage on the floor.   DRAINS:  In and out cath with a red rubber at the end of the case, was approximately 150 mL of clear urine.   PROCEDURE IN DETAIL:  After receiving 2 grams of Ancef and receiving preoperative consent, taken to the operating room, placed in the supine position where anesthesia was initiated, then placed in the dorsal lithotomy position using Allen stirrups, prepped and draped in the usual sterile fashion.  The cervix was visualized and grasped with a single-tooth tenaculum and dilated to admit diagnostic scope which did show a bulge of the left posterior uterine wall consistent with a sonographically visualized fibroid.    We switched to a glycine resectoscope and over approximately 45 minutes tediously dissected free the uterine fibroid, getting it flushed to the surrounding endometrium.  There were multiple switches between diagnostic and then operative scopes throughout the case.   At the end of the procedure all instrument, needle and sponge counts were correct.  The bladder was drained.  The patient tolerated the procedure well.   I did throw a figure-of-eight stitch at the anterior cervix with a single-tooth tenaculum pulled free during dilation.  Hemostasis was excellent.  I anticipate routine postoperative course.    ____________________________ Rockey Situ. Amalia Hailey, MD rle:ea D: 07/11/2012 16:17:38 ET T: 07/12/2012 04:53:13 ET JOB#: [id number]  cc: Audry Pili L. Amalia Hailey,  MD, <Dictator> Selmer Dominion MD ELECTRONICALLY SIGNED 07/12/2012 11:17

## 2014-06-04 NOTE — Op Note (Signed)
PATIENT NAME:  Sandra Gray, Sandra Gray MR#:  [id number] DATE OF BIRTH:  1970/02/20  DATE OF PROCEDURE:  02/15/2012  PREOPERATIVE DIAGNOSIS: Symptomatic fibroid uterus, intracavitary fibroid noted on ultrasound.   POSTOPERATIVE DIAGNOSIS: Symptomatic fibroid uterus, intracavitary fibroid noted on ultrasound.   PROCEDURE: Hysteroscopic resection of intracavitary fibroid.  SURGEON: Ricky L. Amalia Hailey, M.D.   ANESTHESIA: LMA.   FINDINGS: Posterior intraluminal fibroid palpable on bimanual exam. Posterior fundal submucosal fibroid noted.   ESTIMATED BLOOD LOSS: Less than 50.   COMPLICATIONS: None.   SPECIMENS: Portions of fibroid.   PROCEDURE IN DETAIL: The patient had been consented. Preoperative antibiotics were given. She was taken to the operating room and placed in the supine position where anesthesia was initiated, and then prepped and draped in the usual sterile fashion after being placed in the dorsal lithotomy position using Allen stirrups. The cervix was visualized and grasped with a single-tooth tenaculum and dilated up to permit the hysteroscope with evaluation as noted above. Hysteroscope was exchanged for a resectoscope, then with a 40:40 blend under direct visualization fibroid was shaved down flush with the surrounding endometrium. Portions were sent for permanent path. Polyp forceps were used to remove remaining tags of fibroid. The area was seen to be hemostatic. The patient tolerated well. Instruments were removed. Cervix was visualized with some small oozing from the anterior cervix made hemostatic with short application of cautery. The bladder was drained of approximately 100 mL of clear urine. At this point, procedure was felt to receive maximum efficacy . She was returned to supine position and left in the care of Anesthesia. The okay for IM Toradol was given.   The patient tolerated the procedure well. I anticipate a routine postoperative course. Prescriptions for doxycycline and  Vicodin were given and advised against conception for at least two cycles.     ____________________________ Rockey Situ. Amalia Hailey, MD rle:es D: 02/15/2012 09:09:11 ET T: 02/15/2012 09:24:21 ET JOB#: [id number]  cc: Ricky L. Amalia Hailey, MD, <Dictator> Selmer Dominion MD ELECTRONICALLY SIGNED 02/19/2012 9:41

## 2014-06-06 NOTE — Op Note (Signed)
PATIENT NAME:  Sandra Gray, Sandra Gray MR#:  [id number] DATE OF BIRTH:  08-17-1970  DATE OF PROCEDURE:  08/21/2011  PREOPERATIVE DIAGNOSIS: Incomplete abortion.   POSTOPERATIVE DIAGNOSIS: Incomplete abortion.   PROCEDURE: Suction dilation and curettage.   SURGEON: Ricky L. Amalia Hailey, M.D.   ANESTHESIA: General endotracheal.   FINDINGS: Fibroid uterus with return of what appeared to be consistent with products of conception, good hemostasis and cosmesis.   ESTIMATED BLOOD LOSS: 50 mL.   COMPLICATIONS: None.   SPECIMEN: Products of conception.   DRAINS: None.   PROCEDURE IN DETAIL: The patient was found to have suspected chemical "pregnancy" last week with precautions given. She came in today stating she had increase in bleeding and pain this morning and on evaluation was seen to have some light bleeding and slightly dilated cervical os. Given her level of cramping, elected to proceed with suction dilation and curettage. This was discussed with the patient and consent signed. Preoperative antibiotics were given. She was taken to the Operating Room and placed in the supine position where anesthesia was initiated. She was then placed in the dorsal lithotomy position, prepped and draped in the usual sterile fashion. The cervix was visualized and grasped with a single-tooth tenaculum. I dilated up to a #18 [place], which permitted the passage of a #8 curved suction curette with return of tissue consistent with products of conception, as noted above.   Two more passes were made. Gentle sharp curettage was done times all quadrants and good uterine cry was noted throughout. One additional pass with the suction curette was made. The cervix was found to be firming. The instruments were removed. The cervix was seen to be hemostatic. The patient was returned to the supine position and left in the care of anesthesia.  The patient tolerated the procedure well. I anticipate a routine postoperative course. She will  be given a prescription for pain and for doxycycline to take once daily for five days beginning tomorrow. I will see her back in the office in two weeks.  ____________________________ Rockey Situ. Amalia Hailey, MD rle:slb D: 08/21/2011 16:15:50 ET T: 08/21/2011 16:49:14 ET JOB#: [id number]  cc: Ricky L. Amalia Hailey, MD, <Dictator> Selmer Dominion MD ELECTRONICALLY SIGNED 08/27/2011 12:37

## 2014-07-21 ENCOUNTER — Other Ambulatory Visit: Payer: Self-pay | Admitting: *Deleted

## 2014-07-21 MED ORDER — LISINOPRIL 5 MG PO TABS: 5.0000 mg | ORAL_TABLET | Freq: Every day | ORAL | Status: DC

## 2014-08-02 ENCOUNTER — Encounter: Payer: Self-pay | Admitting: Obstetrics and Gynecology

## 2014-08-02 ENCOUNTER — Ambulatory Visit (INDEPENDENT_AMBULATORY_CARE_PROVIDER_SITE_OTHER): Payer: 59 | Admitting: Obstetrics and Gynecology

## 2014-08-02 VITALS — BP 124/80 | Ht 62.0 in | Wt 182.5 lb

## 2014-08-02 DIAGNOSIS — D259 Leiomyoma of uterus, unspecified: Secondary | ICD-10-CM | POA: Insufficient documentation

## 2014-08-02 DIAGNOSIS — R1900 Intra-abdominal and pelvic swelling, mass and lump, unspecified site: Secondary | ICD-10-CM

## 2014-08-02 NOTE — Progress Notes (Addendum)
Patient ID: Sandra Gray, female   DOB: 1970-06-18, 44 y.o.   MRN: [id number]   [facility] Visit  Patient name: Sandra Gray MRN [id number]  Date of birth: 12/20/1970  CC & HPI:  [name] is a 44 y.o. female presenting today for constant right sided abdominal discomfort - she states she can feel "something" in her right sided abdomen near the umbilicus. Patient states she has fibroids, and would like to discuss surgical options for removal. Patient also complains of spotting a week or 2 before menses. She states she started her period this morning, and the bleeding of her menses are sometimes very heavy, although the severity varies from month to month. Patient reports that in 7/13 she had a miscarriage and subsequently a D&C and hysteroscopic fibroid removal - about 4 grams were removed, per medical records. These were done by Dr. Amalia Hailey at [facility] in [place], although he has since left the practice. Patient states she can't remember the date of her last pap smear.    ROS:  A complete 10 system review of systems was obtained and all systems are negative except as noted in the HPI and PMH.   Recent voluntary weight loss. She also recalls a prolonged painful office procedure to dilate up the cervix. Dates unknown  Pertinent History Reviewed:   Reviewed: Significant for D&C and hysteroscopic fibroid removal x 2, one tissue sample shows 4 GRAMS of tissue resected.  Office u/s in 2012 showed a 11 x 5 x 4 cm uterus (est 115 gram.) Medical         Past Medical History  Diagnosis Date  . Hypertension 2013    Started on lisinopril  . Asthma 2008  . Bronchitis                               Surgical Hx:    Past Surgical History  Procedure Laterality Date  . Dilation and curettage of uterus  08/2011  . Hysteroscopy  02/15/2012    Fibroid Removal   Medications: Reviewed & Updated - see associated section                       Current outpatient prescriptions:  .   albuterol (PROVENTIL HFA;VENTOLIN HFA) 108 (90 BASE) MCG/ACT inhaler, Inhale 2 puffs into the lungs every 6 (six) hours as needed for wheezing., Disp: 1 Inhaler, Rfl: 2 .  ALPRAZolam (XANAX) 0.25 MG tablet, Take 1 tablet (0.25 mg total) by mouth daily as needed., Disp: 30 tablet, Rfl: 3 .  amLODipine (NORVASC) 5 MG tablet, Take 1 tablet (5 mg total) by mouth daily., Disp: 90 tablet, Rfl: 1 .  lisinopril (PRINIVIL,ZESTRIL) 5 MG tablet, Take 1 tablet (5 mg total) by mouth daily., Disp: 90 tablet, Rfl: 1   Social History: Reviewed -  reports that she has never smoked. She has never used smokeless tobacco.  Objective Findings:  Vitals: There were no vitals taken for this visit.  Physical Examination: General appearance - alert, well appearing, and in no distress and oriented to person, place, and time, overweight Mental status - alert, oriented to person, place, and time, normal mood, behavior, speech, dress, motor activity, and thought processes, affect appropriate to mood Abdominal Wall - thickening with apparent large mass to 6 cm above the umbilicus  Estimated size 1000+ gram uterus/mass, mobile upper portion of mass,  Pelvic - normal external genitalia, good  vaginal length, with cervix pushed anterior by a firm hard mass in cul de sac extending over to the pt right, immobile on the right side. VULVA: normal appearing vulva with no masses, tenderness or lesions, VAGINA: good support, irregular hard firm mass extending over to right side wall. The surface of the cul de sac has a nodular appearance, with several smooth red protruding nodules, that may be edematous normal tissue or a growth. CERVIX: normal appearing cervix without discharge or lesions, pushed anterior  UTERUS: enlarged mass felt to 6 cm above the umbilicus, mobile; the cervix is pushed anterior by a firm, hard mass located in the posterior Cul de sac. The cul de sac is very irregular and visually has reddened nodules suspected to be  quite vascular. These may represent scar tissue after unknown office procedures, or neoplasm cannot yet be ruled out.    Assessment & Plan:   A:  1. Pelvic mass, likely Large uterine fibroid, possible ovary 2.  additional irregularities in the Cul de sac. Scar tissue vs. Neoplasm.  3. History of multiple GYN office procedures to "open up cervix."  P:  1. Schedule u/s to be done in-office in 4 days.  2. Request record from [facility] 3 delay labs til u/s 4. Probable vaginal biopsy of the cul de sac lesions, may require local anesthesia. This chart was SCRIBED for Sandra Shirk, MD by Stephania Fragmin, ED Scribe. This patient was seen in room 1 and the patient's care was started at 4:17 PM.  I personally performed the services described in this documentation, which was SCRIBED in my presence. The recorded information has been reviewed and considered accurate. It has been edited as necessary during review. [name], MD

## 2014-08-02 NOTE — Patient Instructions (Signed)
Please sign up for my Chart Request for release of information from [facility] needed.

## 2014-08-02 NOTE — Progress Notes (Signed)
Patient ID: Sandra Gray, female   DOB: May 26, 1970, 44 y.o.   MRN: [id number] Pt here today for consult. Pt states that she has fibroids and wants to discuss surgery for this. Pt was a pt at [facility] in [place] before coming here.

## 2014-08-05 ENCOUNTER — Other Ambulatory Visit: Payer: Self-pay | Admitting: Obstetrics and Gynecology

## 2014-08-05 DIAGNOSIS — D259 Leiomyoma of uterus, unspecified: Secondary | ICD-10-CM

## 2014-08-06 ENCOUNTER — Ambulatory Visit (INDEPENDENT_AMBULATORY_CARE_PROVIDER_SITE_OTHER): Payer: 59 | Admitting: Obstetrics and Gynecology

## 2014-08-06 ENCOUNTER — Ambulatory Visit (INDEPENDENT_AMBULATORY_CARE_PROVIDER_SITE_OTHER): Payer: 59

## 2014-08-06 DIAGNOSIS — D252 Subserosal leiomyoma of uterus: Secondary | ICD-10-CM

## 2014-08-06 DIAGNOSIS — D259 Leiomyoma of uterus, unspecified: Secondary | ICD-10-CM | POA: Diagnosis not present

## 2014-08-06 NOTE — Progress Notes (Addendum)
Patient ID: Sandra Gray, female   DOB: 03-Jul-1970, 44 y.o.   MRN: [id number]    [facility] Visit  Patient name: Sandra Gray MRN [id number]  Date of birth: [birth year]  CC & HPI:  [name] is a 44 y.o. female s/p significant for Hysteroscopy and D&C of the uterus presenting today for vaginal [place] and follow-up of moderate abdominal pain secondary to uterine fibroids. Pt states menorrhagia and urinary frequency as associated symptoms. She reports pain becomes worse during menses. Pt also notes feeling the fibroids when she lies on her abdomen. Pt was seen on 6/20 for the same pain. An exam of her abdomen at that visit showed thickening with apparent large mass to 6 cm, above the umbilicus. [place] detected uterus/mass withestimated size 1000+ gram. Pt had a biopsy of her endometrium, and submucous fibroid in 2014 which was benign.   ROS:  A  10 system review of systems was obtained and all systems are negative except as noted in the HPI and PMH.   Pertinent History Reviewed:   Reviewed: Significant for Hysteroscopy and D&C of the uterus Medical         Past Medical History  Diagnosis Date  . Hypertension 2013    Started on lisinopril  . Asthma 2008  . Bronchitis   . Fibroids                               Surgical Hx:    Past Surgical History  Procedure Laterality Date  . Dilation and curettage of uterus  08/2011  . Hysteroscopy  02/15/2012    Fibroid Removal  . Hysteroscopy  may 2014   Medications: Reviewed & Updated - see associated section                       Current outpatient prescriptions:  .  albuterol (PROVENTIL HFA;VENTOLIN HFA) 108 (90 BASE) MCG/ACT inhaler, Inhale 2 puffs into the lungs every 6 (six) hours as needed for wheezing., Disp: 1 Inhaler, Rfl: 2 .  ALPRAZolam (XANAX) 0.25 MG tablet, Take 1 tablet (0.25 mg total) by mouth daily as needed. (Patient not taking: Reported on 08/02/2014), Disp: 30 tablet, Rfl: 3 .  amLODipine (NORVASC) 5 MG tablet,  Take 1 tablet (5 mg total) by mouth daily., Disp: 90 tablet, Rfl: 1 .  lisinopril (PRINIVIL,ZESTRIL) 5 MG tablet, Take 1 tablet (5 mg total) by mouth daily., Disp: 90 tablet, Rfl: 1   Social History: Reviewed -  reports that she has never smoked. She has never used smokeless tobacco.  Objective Findings:  Vitals: Last menstrual period 08/02/2014.  Physical Examination: General appearance - alert, well appearing, and in no distress and oriented to person, place, and time Mental status - alert, oriented to person, place, and time, normal mood, behavior, speech, dress, motor activity, and thought processes Abdomen - soft, nontender, nondistended, no organomegaly; thickening of the abdominal wall with apparent large mass to 6 cm, above the umbilicus [id number] minutes  Assessment & Plan:   A:  1. US shows large fibroids measuring 16 cm, ovaries normal 2. Menorrhagia from persistent submucous fibroid 3. Discussed hysterectomy including pros and cons of removing the cervix  P:  1. Lupron decliied 2. Discussed supracervical hysterectomy and bilateral salpingectomy, will schedule for next month, mid july 3. Follow-up from [name] to schedule surgery 4. Will need for pt to bring in copy of most recent  pap. Current record shows pap in 2011, more current pap needed.  This chart was scribed for [name], MD by Tula Nakayama, ED Scribe. This patient was seen in room [place] and the patient's care was started at 1:38 PM.   I personally performed the services described in this documentation, which was SCRIBED in my presence. The recorded information has been reviewed and considered accurate. It has been edited as necessary during review. [name], MD   time >25 min review u/s, discuss surgery, review surgical options , arrange fu.

## 2014-08-19 ENCOUNTER — Encounter: Payer: Self-pay | Admitting: *Deleted

## 2014-08-24 DIAGNOSIS — Z029 Encounter for administrative examinations, unspecified: Secondary | ICD-10-CM

## 2014-08-25 NOTE — Patient Instructions (Signed)
Aniqua Briere  08/25/2014     @PREFPERIOPPHARMACY @   Your procedure is scheduled on 08/31/2014.  Report to [facility] at 6:15 A.M.  Call this number if you have problems the morning of surgery:  [phone]   Remember:  Do not eat food or drink liquids after midnight.  Take these medicines the morning of surgery with A SIP OF WATER : Lisinopril, Norvasc, and Albuterol Inhaler.  Please be sure to use your Albuterol Inhaler before coming to the hospital and also bring it with you.  Do not wear jewelry, make-up or nail polish.  Do not wear lotions, powders, or perfumes.  You may wear deodorant.  Do not shave 48 hours prior to surgery.  Men may shave face and neck.  Do not bring valuables to the hospital.  [facility] is not responsible for any belongings or valuables.  Contacts, dentures or bridgework may not be worn into surgery.  Leave your suitcase in the car.  After surgery it may be brought to your room.  For patients admitted to the hospital, discharge time will be determined by your treatment team.  Patients discharged the day of surgery will not be allowed to drive home.   Name and phone number of your driver:   family Special instructions:  n/a  Please read over the following fact sheets that you were given. Care and Recovery After Surgery    Supracervical Hysterectomy A supracervical hysterectomy is surgery to remove the top part of the uterus, but not the cervix. You will no longer have menstrual periods or be able to get pregnant after this surgery. The fallopian tubes and ovaries may also be removed (bilateral salpingo-oophorectomy) during this surgery. This surgery is usually performed using a minimally invasive technique called laparoscopy. This technique allows the surgery to be done through small incisions. The minimally invasive technique provides benefits such as less pain, less risk of infection, and shorter recovery time. LET [facility] CARE PROVIDER KNOW  ABOUT:  Any allergies you have.  All medicines you are taking, including vitamins, herbs, eye drops, creams, and over-the-counter medicines.  Previous problems you or members of your family have had with the use of anesthetics.  Any blood disorders you have.  Previous surgeries you have had.  Medical conditions you have. RISKS AND COMPLICATIONS  Generally, this is a safe procedure. However, as with any procedure, complications can occur. Possible complications include:  Bleeding.  Blood clots in the legs or lung.  Infection.  Injury to surrounding organs.  Problems related to anesthesia.  Conversion to an open abdominal surgery.  Additional surgery later to remove the cervix if you have problems with the cervix. BEFORE THE PROCEDURE  Ask your health care provider about changing or stopping your regular medicines.  Do not take aspirin or blood thinners (anticoagulants) for 1 week before the surgery, or as directed by your health care provider.  Do not eat or drink anything for 8 hours before the surgery, or as directed by your health care provider.  Quit smoking if you smoke.  Arrange for a ride home after surgery and for someone to help you at home during recovery. PROCEDURE   You will be given an antibiotic medicine.  An IV tube will be placed in one of your veins. You will be given medicine to make you sleep (general anesthetic).  A gas (carbon dioxide) will be used to inflate your abdomen. This will allow your surgeon to look inside your abdomen, perform  your surgery, and treat any other problems found if necessary.  Three or four small incisions will be made in your abdomen. One of these incisions will be made in the area of your belly button (navel). A thin, flexible tube with a tiny camera and light on the end of it (laparoscope) will be inserted into the incision. The camera on the laparoscope sends a picture to a TV screen in the operating room. This gives your  surgeon a good view inside the abdomen.  Other surgical instruments will be inserted through the other incisions.  The uterus will be cut into small pieces and removed through the small incisions.  Your incisions will be closed. AFTER THE PROCEDURE   You will be taken to a recovery area where your progress will be monitored until you are awake, stable, and taking fluids well. If there are no other problems, you will then be moved to a regular hospital room, or you will be allowed to go home.  You will likely have minimal discomfort after the surgery because the incisions are so small with the laparoscopic technique.  You will be given pain medicine while you are in the hospital and for when you go home.  If a bilateral salpingo-oophorectomy was performed before menopause, you will go through a sudden (abrupt) menopause. This can be helped with hormone medicines. Document Released: 07/18/2007 Document Revised: 11/19/2012 Document Reviewed: 08/01/2012 [facility] Patient Information 2015 [place], [place]. This information is not intended to replace advice given to you by your health care provider. Make sure you discuss any questions you have with your health care provider.

## 2014-08-26 ENCOUNTER — Ambulatory Visit (INDEPENDENT_AMBULATORY_CARE_PROVIDER_SITE_OTHER): Payer: 59 | Admitting: Obstetrics and Gynecology

## 2014-08-26 ENCOUNTER — Encounter (HOSPITAL_COMMUNITY)
Admission: RE | Admit: 2014-08-26 | Discharge: 2014-08-26 | Disposition: A | Discharge status: Home / Self Care | Payer: 59 | Source: Ambulatory Visit | Attending: Obstetrics and Gynecology | Admitting: Obstetrics and Gynecology

## 2014-08-26 ENCOUNTER — Other Ambulatory Visit (HOSPITAL_COMMUNITY)
Admission: RE | Admit: 2014-08-26 | Discharge: 2014-08-26 | Disposition: A | Discharge status: Home / Self Care | Payer: 59 | Source: Ambulatory Visit | Attending: Obstetrics and Gynecology | Admitting: Obstetrics and Gynecology

## 2014-08-26 ENCOUNTER — Encounter (HOSPITAL_COMMUNITY): Payer: Self-pay

## 2014-08-26 ENCOUNTER — Telehealth: Payer: Self-pay | Admitting: Obstetrics and Gynecology

## 2014-08-26 ENCOUNTER — Encounter: Payer: Self-pay | Admitting: Obstetrics and Gynecology

## 2014-08-26 ENCOUNTER — Other Ambulatory Visit: Payer: Self-pay

## 2014-08-26 VITALS — BP 112/78 | Ht 62.0 in | Wt 180.0 lb

## 2014-08-26 DIAGNOSIS — Z01419 Encounter for gynecological examination (general) (routine) without abnormal findings: Secondary | ICD-10-CM | POA: Diagnosis not present

## 2014-08-26 DIAGNOSIS — Z124 Encounter for screening for malignant neoplasm of cervix: Secondary | ICD-10-CM

## 2014-08-26 DIAGNOSIS — D259 Leiomyoma of uterus, unspecified: Secondary | ICD-10-CM | POA: Insufficient documentation

## 2014-08-26 DIAGNOSIS — Z1151 Encounter for screening for human papillomavirus (HPV): Secondary | ICD-10-CM | POA: Insufficient documentation

## 2014-08-26 DIAGNOSIS — Z113 Encounter for screening for infections with a predominantly sexual mode of transmission: Secondary | ICD-10-CM | POA: Insufficient documentation

## 2014-08-26 DIAGNOSIS — Z01818 Encounter for other preprocedural examination: Secondary | ICD-10-CM | POA: Insufficient documentation

## 2014-08-26 LAB — BASIC METABOLIC PANEL
Anion gap: 8 (ref 5–15)
BUN: 14 mg/dL (ref 6–20)
CALCIUM: 9 mg/dL (ref 8.9–10.3)
CO2: 22 mmol/L (ref 22–32)
CREATININE: 1.32 mg/dL — AB (ref 0.44–1.00)
Chloride: 108 mmol/L (ref 101–111)
GFR, EST AFRICAN AMERICAN: 57 mL/min — AB (ref >60)
GFR, EST NON AFRICAN AMERICAN: 49 mL/min — AB (ref >60)
Glucose, Bld: 70 mg/dL (ref 65–99)
POTASSIUM: 4.2 mmol/L (ref 3.5–5.1)
Sodium: 138 mmol/L (ref 135–145)

## 2014-08-26 LAB — CBC
HCT: 36.1 % (ref 36.0–46.0)
HEMOGLOBIN: 11 g/dL — AB (ref 12.0–15.0)
MCH: 23.6 pg — ABNORMAL LOW (ref 26.0–34.0)
MCHC: 30.5 g/dL (ref 30.0–36.0)
MCV: 77.3 fL — ABNORMAL LOW (ref 78.0–100.0)
PLATELETS: 408 10*3/uL — AB (ref 150–400)
RBC: 4.67 MIL/uL (ref 3.87–5.11)
RDW: 16.1 % — AB (ref 11.5–15.5)
WBC: 9.8 10*3/uL (ref 4.0–10.5)

## 2014-08-26 LAB — URINALYSIS, ROUTINE W REFLEX MICROSCOPIC
Bilirubin Urine: NEGATIVE
Glucose, UA: NEGATIVE mg/dL
Ketones, ur: NEGATIVE mg/dL
Leukocytes, UA: NEGATIVE
Nitrite: NEGATIVE
PROTEIN: NEGATIVE mg/dL
Specific Gravity, Urine: >1.03 — ABNORMAL HIGH (ref 1.005–1.030)
Urobilinogen, UA: 0.2 mg/dL (ref 0.0–1.0)
pH: 6 (ref 5.0–8.0)

## 2014-08-26 LAB — URINE MICROSCOPIC-ADD ON

## 2014-08-26 LAB — SURGICAL PCR SCREEN
MRSA, PCR: NEGATIVE
STAPHYLOCOCCUS AUREUS: NEGATIVE

## 2014-08-26 LAB — HCG, SERUM, QUALITATIVE: Preg, Serum: NEGATIVE

## 2014-08-26 NOTE — Progress Notes (Signed)
Patient ID: Sandra Gray, female   DOB: 1970-07-14, 44 y.o.   MRN: [id number]  Assessment:  Annual Gyn Exam she is here to have pap done after being unable to find pap results that she thought were recorded at prior Gyn provider   Plan:  1. pap smear done, next pap due q 3 yr  2. return annually or prn 3    Annual mammogram advised Subjective:  Sandra Gray is a 44 y.o. female No obstetric history on file. who presents for annual exam. Patient's last menstrual period was 08/02/2014. The patient has complaints today of needs pap prior to planned supracervical hyst  The following portions of the patient's history were reviewed and updated as appropriate: allergies, current medications, past family history, past medical history, past social history, past surgical history and problem list. Past Medical History  Diagnosis Date  . Hypertension 2013    Started on lisinopril  . Asthma 2008  . Bronchitis   . Fibroids     Past Surgical History  Procedure Laterality Date  . Dilation and curettage of uterus  08/2011  . Hysteroscopy  02/15/2012    Fibroid Removal  . Hysteroscopy  may 2014     Current outpatient prescriptions:  .  albuterol (PROVENTIL HFA;VENTOLIN HFA) 108 (90 BASE) MCG/ACT inhaler, Inhale 2 puffs into the lungs every 6 (six) hours as needed for wheezing., Disp: 1 Inhaler, Rfl: 2 .  amLODipine (NORVASC) 5 MG tablet, Take 1 tablet (5 mg total) by mouth daily., Disp: 90 tablet, Rfl: 1 .  lisinopril (PRINIVIL,ZESTRIL) 5 MG tablet, Take 1 tablet (5 mg total) by mouth daily., Disp: 90 tablet, Rfl: 1 .  ALPRAZolam (XANAX) 0.25 MG tablet, Take 1 tablet (0.25 mg total) by mouth daily as needed. (Patient not taking: Reported on 08/26/2014), Disp: 30 tablet, Rfl: 3  Review of Systems Constitutional: negative Gastrointestinal: negative Genitourinary:   Objective:  BP 112/78 mmHg  Ht 5\' 2"  (1.575 m)  Wt 180 lb (81.647 kg)  BMI 32.91 kg/m2  LMP 08/02/2014   BMI: Body mass  index is 32.91 kg/(m^2).  General Appearance: Alert, appropriate appearance for age. No acute distress HEENT: Grossly normal Neck / Thyroid:  Cardiovascular: RRR; normal S1, S2, no murmur Lungs: CTA bilaterally Back: No CVAT Breast Exam:  Gastrointestinal: Soft, non-tender, stable abd mass to right of umbilicus Pelvic Exam: External genitalia: normal general appearance Vaginal: normal mucosa without prolapse or lesions Cervix: normal appearance Adnexa: enlarged uterus c/w fibroids Uterus: enlarged and irregular enlargement Rectovaginal: normal rectal, no masses Lymphatic Exam: Non-palpable nodes in neck, clavicular, axillary, or inguinal regions Skin: no rash or abnormalities Neurologic: Normal gait and speech, no tremor  Psychiatric: Alert and oriented, appropriate affect.  Urinalysis:Not done  [name]. MD Pgr [phone] 2:39 PM

## 2014-08-26 NOTE — Pre-Procedure Instructions (Signed)
Obtain Magnesium Citrate 1 bottle and drink at noon, day prior to surgery.  Nothing but full liquids afterwards, until midnight.  Nothing to eat or drink after midnight.

## 2014-08-27 LAB — CYTOLOGY - PAP

## 2014-08-29 NOTE — Telephone Encounter (Signed)
Pt left a message re: normal pap

## 2014-08-30 ENCOUNTER — Other Ambulatory Visit: Payer: Self-pay | Admitting: Obstetrics and Gynecology

## 2014-08-30 ENCOUNTER — Telehealth: Payer: Self-pay | Admitting: *Deleted

## 2014-08-30 NOTE — H&P (Signed)
Preoperative History and Physical  Sandra Gray is a 44 y.o. No obstetric history on file. here for surgical management of uterine fibroids..   No significant preoperative concerns. The fibroids are large and will require an open abdominal incision. The patient has been seen in our office, where preoperative evaluation has included a normal pap smear done last week, with + chlamydia culture by PCR for which pt received azithromycin 1 gm po, and negative GC by PCR,  An u/s showing an enlarged uterus with multiple fibroids including a pedunculated fibroid arising from the uterine fundus, and grossly normal ovaries, review of endometrial biopsy done earlier at prior physician showing benign endometrium with a submucosal fibroid on histology.  Proposed surgery: Abdominal supracervical hysterectomy, bilateral salpingectomy.  Past Medical History  Diagnosis Date  . Hypertension 2013    Started on lisinopril  . Asthma 2008  . Bronchitis   . Fibroids    Past Surgical History  Procedure Laterality Date  . Dilation and curettage of uterus  08/2011  . Hysteroscopy  02/15/2012    Fibroid Removal  . Hysteroscopy  may 2014   OB History  No data available  Patient denies any other pertinent gynecologic issues.   Current Outpatient Prescriptions on File Prior to Visit  Medication Sig Dispense Refill  . albuterol (PROVENTIL HFA;VENTOLIN HFA) 108 (90 BASE) MCG/ACT inhaler Inhale 2 puffs into the lungs every 6 (six) hours as needed for wheezing. 1 Inhaler 2  . ALPRAZolam (XANAX) 0.25 MG tablet Take 1 tablet (0.25 mg total) by mouth daily as needed. (Patient not taking: Reported on 08/26/2014) 30 tablet 3  . amLODipine (NORVASC) 5 MG tablet Take 1 tablet (5 mg total) by mouth daily. 90 tablet 1  . lisinopril (PRINIVIL,ZESTRIL) 5 MG tablet Take 1 tablet (5 mg total) by mouth daily. 90 tablet 1   No current facility-administered medications on file prior to visit.   No Known Allergies  Social  History:   reports that she has never smoked. She has never used smokeless tobacco. She reports that she does not drink alcohol or use illicit drugs.  Family History  Problem Relation Age of Onset  . Hypertension Mother   . Hypertension Father   . Diabetes Father   . Kidney disease Maternal Grandmother   . Cancer Maternal Grandfather     Lung - Asbestos exposure  . Diabetes Paternal Grandmother   . Heart disease Paternal Grandfather     Review of Systems: Noncontributory  PHYSICAL EXAM: Last menstrual period 08/02/2014. General appearance - alert, well appearing, and in no distress Chest - clear to auscultation, no wheezes, rales or rhonchi, symmetric air entry Heart - normal rate and regular rhythm Abdomen - soft, nontender, nondistended, no organomegaly; thickening of the abdominal wall with apparent large mass to 6 cm, above the umbilicus confirmed by u/s as a uterine fibroid enlargement. Pelvic - EFG normal, cervix smooth nonpurulent well supported. Extremities - peripheral pulses normal, no pedal edema, no clubbing or cyanosis  Labs: Results for orders placed or performed in visit on 08/26/14 (from the past 336 hour(s))  Cytology - PAP   Collection Time: 08/26/14 12:00 AM  Result Value Ref Range   CYTOLOGY - PAP PAP RESULT   Results for orders placed or performed during the hospital encounter of 08/26/14 (from the past 336 hour(s))  CBC   Collection Time: 08/26/14  1:00 PM  Result Value Ref Range   WBC 9.8 4.0 - 10.5 K/uL   RBC 4.67 3.87 -  5.11 MIL/uL   Hemoglobin 11.0 (L) 12.0 - 15.0 g/dL   HCT 36.1 36.0 - 46.0 %   MCV 77.3 (L) 78.0 - 100.0 fL   MCH 23.6 (L) 26.0 - 34.0 pg   MCHC 30.5 30.0 - 36.0 g/dL   RDW 16.1 (H) 11.5 - 15.5 %   Platelets 408 (H) 150 - 400 K/uL  Basic metabolic panel   Collection Time: 08/26/14  1:00 PM  Result Value Ref Range   Sodium 138 135 - 145 mmol/L   Potassium 4.2 3.5 - 5.1 mmol/L   Chloride 108 101 - 111 mmol/L   CO2 22 22 - 32  mmol/L   Glucose, Bld 70 65 - 99 mg/dL   BUN 14 6 - 20 mg/dL   Creatinine, Ser 1.32 (H) 0.44 - 1.00 mg/dL   Calcium 9.0 8.9 - 10.3 mg/dL   GFR calc non Af Amer 49 (L) >60 mL/min   GFR calc Af Amer 57 (L) >60 mL/min   Anion gap 8 5 - 15  hCG, serum, qualitative   Collection Time: 08/26/14  1:00 PM  Result Value Ref Range   Preg, Serum NEGATIVE NEGATIVE  Urinalysis, Routine w reflex microscopic (not at [facility])   Collection Time: 08/26/14  1:00 PM  Result Value Ref Range   Color, Urine YELLOW YELLOW   APPearance CLEAR CLEAR   Specific Gravity, Urine >1.030 (H) 1.005 - 1.030   pH 6.0 5.0 - 8.0   Glucose, UA NEGATIVE NEGATIVE mg/dL   Hgb urine dipstick SMALL (A) NEGATIVE   Bilirubin Urine NEGATIVE NEGATIVE   Ketones, ur NEGATIVE NEGATIVE mg/dL   Protein, ur NEGATIVE NEGATIVE mg/dL   Urobilinogen, UA 0.2 0.0 - 1.0 mg/dL   Nitrite NEGATIVE NEGATIVE   Leukocytes, UA NEGATIVE NEGATIVE  Urine microscopic-add on   Collection Time: 08/26/14  1:00 PM  Result Value Ref Range   Squamous Epithelial / LPF RARE RARE   WBC, UA 0-2 <3 WBC/hpf   RBC / HPF 0-2 <3 RBC/hpf   Bacteria, UA RARE RARE  Surgical pcr screen   Collection Time: 08/26/14  1:30 PM  Result Value Ref Range   MRSA, PCR NEGATIVE NEGATIVE   Staphylococcus aureus NEGATIVE NEGATIVE  Type and screen   Collection Time: 08/26/14  2:00 PM  Result Value Ref Range   ABO/RH(D) A POS    Antibody Screen NEG    Sample Expiration 09/09/2014     Imaging Studies: US Pelvis Complete  08/08/2014   GYNECOLOGIC SONOGRAM   Sandra Gray is a 44 y.o.  LMP 08/02/2014,with a hx of  mult.fibroids,patient now has a palpable abdominal mass. Patient is here  for a pelvic sonogram to check the size of the fibroids.    Uterus                   12.7 x 7.9 x 6.8 cm, anteverted uterus  w/mult.fibroids. (#1) pedunculated fundal fibroid 16 x 11.5 x 9cm,(#2) lt  pedunculated fibroid 8.3 x 4.5 x 6cm,(#3) ant mid ut, pedunculated 2.4 x  1.8 x 2.8 cm   Endometrium          5.2 mm, asymmetrical, contains a 2.4 x 1.2 x 2cm   polyp  Right ovary             5.5 x 2.5 x 3.8 cm, wnl  Left ovary                3.9 x 2.2 x 2.5 cm, wnl  Technician Comments:  US pelvis T/A: anteverted uterus w/mult.fibroids (#1) pedunculated fundal  fibroid 16 x 11.5 x 9cm,(#2) lt pedunculated fibroid 8.3 x 4.5 x 6cm,(#3)  ant mid ut, pedunculated fibroid 2.4 x 1.8 x 2.8 cm,endometrium contains  2.4 x 1.2 x 2cm polyp    Sandra Gray 08/06/2014 2:05 PM Clinical Impression and recommendations:  I have reviewed the sonogram results above, and was present during the u/s  itself, with discussion in office to follow.  Combined with the patient's current clinical course, below are my  impressions and any appropriate recommendations for management based on  the sonographic findings:  Multiple large fibroids, with a particularly large pedunculated fibroid  extending above the uterine body to above the umbilicus. Mobile mass. Polyp" noted likely represents the remainder of previously biopsied and  partially removed submucous myoma. Prior biopsies histology proven as  normal, benign, 2014. Normal ovaries bilateral.  Masha Orbach V    Assessment: Patient Active Problem List   Diagnosis Date Noted  . Pelvic mass in female 08/02/2014  . Uterine fibroid 12/28/2013  . Obesity (BMI 30-39.9) 11/30/2013  . Hypertension 03/26/2012  . Asthma with bronchitis 03/26/2012  . Anxiety, mild 03/26/2012  . Routine general medical examination at a health care facility 03/26/2012    Plan: Patient will undergo surgical management with Abdominal supracervical hysterectomy, bilateral salpingectomy Rationale for surgery, alternatives strategies including the use of Lupron for preoperative reduction of uterine size all discussed with the patient as alternatives. Dr Elonda Husky to assist..    Derenda Giddings V  08/30/2014 5:27 PM

## 2014-08-30 NOTE — Telephone Encounter (Signed)
Pt informed of positive Chlamydia on pap from 08/26/2014. Verbal order given from Dr. Glo Herring called to [facility], Azithromycin 1 GM po today, no refills. Pt informed no sex until after POC.   Pt verbalized understanding.

## 2014-08-31 ENCOUNTER — Encounter (HOSPITAL_COMMUNITY): Payer: Self-pay | Admitting: *Deleted

## 2014-08-31 ENCOUNTER — Inpatient Hospital Stay (HOSPITAL_COMMUNITY): Payer: 59 | Admitting: Anesthesiology

## 2014-08-31 ENCOUNTER — Encounter (HOSPITAL_COMMUNITY)
Admission: RE | Disposition: A | Discharge status: Home / Self Care | Payer: Self-pay | Source: Ambulatory Visit | Attending: Obstetrics and Gynecology

## 2014-08-31 ENCOUNTER — Inpatient Hospital Stay (HOSPITAL_COMMUNITY)
Admission: RE | Admit: 2014-08-31 | Discharge: 2014-09-02 | DRG: 742 | Disposition: A | Discharge status: Home / Self Care | Payer: 59 | Source: Ambulatory Visit | Attending: Obstetrics and Gynecology | Admitting: Obstetrics and Gynecology

## 2014-08-31 ENCOUNTER — Other Ambulatory Visit: Payer: Self-pay | Admitting: *Deleted

## 2014-08-31 DIAGNOSIS — I10 Essential (primary) hypertension: Secondary | ICD-10-CM | POA: Diagnosis present

## 2014-08-31 DIAGNOSIS — N800 Endometriosis of uterus: Secondary | ICD-10-CM | POA: Diagnosis present

## 2014-08-31 DIAGNOSIS — Z8249 Family history of ischemic heart disease and other diseases of the circulatory system: Secondary | ICD-10-CM | POA: Diagnosis not present

## 2014-08-31 DIAGNOSIS — D252 Subserosal leiomyoma of uterus: Secondary | ICD-10-CM

## 2014-08-31 DIAGNOSIS — N736 Female pelvic peritoneal adhesions (postinfective): Secondary | ICD-10-CM

## 2014-08-31 DIAGNOSIS — Z90711 Acquired absence of uterus with remaining cervical stump: Secondary | ICD-10-CM | POA: Diagnosis present

## 2014-08-31 DIAGNOSIS — D62 Acute posthemorrhagic anemia: Secondary | ICD-10-CM | POA: Diagnosis present

## 2014-08-31 DIAGNOSIS — K660 Peritoneal adhesions (postprocedural) (postinfection): Secondary | ICD-10-CM | POA: Diagnosis present

## 2014-08-31 DIAGNOSIS — D259 Leiomyoma of uterus, unspecified: Secondary | ICD-10-CM | POA: Diagnosis present

## 2014-08-31 DIAGNOSIS — N840 Polyp of corpus uteri: Secondary | ICD-10-CM

## 2014-08-31 DIAGNOSIS — F419 Anxiety disorder, unspecified: Secondary | ICD-10-CM | POA: Diagnosis present

## 2014-08-31 DIAGNOSIS — D251 Intramural leiomyoma of uterus: Secondary | ICD-10-CM

## 2014-08-31 DIAGNOSIS — A5602 Chlamydial vulvovaginitis: Secondary | ICD-10-CM | POA: Diagnosis present

## 2014-08-31 DIAGNOSIS — A5609 Other chlamydial infection of lower genitourinary tract: Secondary | ICD-10-CM

## 2014-08-31 DIAGNOSIS — D250 Submucous leiomyoma of uterus: Principal | ICD-10-CM | POA: Diagnosis present

## 2014-08-31 DIAGNOSIS — Z833 Family history of diabetes mellitus: Secondary | ICD-10-CM | POA: Diagnosis not present

## 2014-08-31 DIAGNOSIS — J45909 Unspecified asthma, uncomplicated: Secondary | ICD-10-CM | POA: Diagnosis present

## 2014-08-31 HISTORY — PX: SUPRACERVICAL ABDOMINAL HYSTERECTOMY: SHX5393

## 2014-08-31 LAB — HEMOGLOBIN AND HEMATOCRIT, BLOOD
HCT: 32.2 % — ABNORMAL LOW (ref 36.0–46.0)
Hemoglobin: 10.1 g/dL — ABNORMAL LOW (ref 12.0–15.0)

## 2014-08-31 LAB — PREPARE RBC (CROSSMATCH)

## 2014-08-31 LAB — ABO/RH: ABO/RH(D): A POS

## 2014-08-31 SURGERY — HYSTERECTOMY, SUPRACERVICAL, ABDOMINAL
Anesthesia: General

## 2014-08-31 MED ORDER — HYDROMORPHONE 0.3 MG/ML IV SOLN
INTRAVENOUS | Status: DC
  Administered 2014-08-31: 12:00:00 via INTRAVENOUS
  Administered 2014-08-31: 2.7 mg via INTRAVENOUS
  Administered 2014-09-01: 13:00:00 via INTRAVENOUS
  Administered 2014-09-02: 0 mg via INTRAVENOUS
  Filled 2014-08-31: qty 25

## 2014-08-31 MED ORDER — ROCURONIUM BROMIDE 50 MG/5ML IV SOLN
INTRAVENOUS | Status: AC
  Filled 2014-08-31: qty 1

## 2014-08-31 MED ORDER — ALBUTEROL SULFATE (2.5 MG/3ML) 0.083% IN NEBU: 2.5000 mg | INHALATION_SOLUTION | Freq: Four times a day (QID) | RESPIRATORY_TRACT | Status: DC | PRN

## 2014-08-31 MED ORDER — AMLODIPINE BESYLATE 5 MG PO TABS
5.0000 mg | ORAL_TABLET | Freq: Every day | ORAL | Status: DC
  Administered 2014-09-01: 5 mg via ORAL
  Filled 2014-08-31: qty 1

## 2014-08-31 MED ORDER — OXYCODONE-ACETAMINOPHEN 5-325 MG PO TABS: 1.0000 | ORAL_TABLET | ORAL | Status: DC | PRN

## 2014-08-31 MED ORDER — PROPOFOL 10 MG/ML IV BOLUS
INTRAVENOUS | Status: DC | PRN
  Administered 2014-08-31: 120 mg via INTRAVENOUS

## 2014-08-31 MED ORDER — ROCURONIUM BROMIDE 100 MG/10ML IV SOLN
INTRAVENOUS | Status: DC | PRN
  Administered 2014-08-31 (×2): 10 mg via INTRAVENOUS
  Administered 2014-08-31: 40 mg via INTRAVENOUS

## 2014-08-31 MED ORDER — IBUPROFEN 600 MG PO TABS: 600.0000 mg | ORAL_TABLET | Freq: Four times a day (QID) | ORAL | Status: DC | PRN

## 2014-08-31 MED ORDER — FENTANYL CITRATE (PF) 100 MCG/2ML IJ SOLN
25.0000 ug | INTRAMUSCULAR | Status: AC
  Administered 2014-08-31 (×2): 25 ug via INTRAVENOUS

## 2014-08-31 MED ORDER — LIDOCAINE HCL 1 % IJ SOLN
INTRAMUSCULAR | Status: DC | PRN
  Administered 2014-08-31: 25 mg via INTRADERMAL

## 2014-08-31 MED ORDER — CEFAZOLIN SODIUM-DEXTROSE 2-3 GM-% IV SOLR
2.0000 g | INTRAVENOUS | Status: AC
  Administered 2014-08-31: 2 g via INTRAVENOUS
  Filled 2014-08-31: qty 50

## 2014-08-31 MED ORDER — SODIUM CHLORIDE 0.9 % IJ SOLN
INTRAMUSCULAR | Status: AC
  Filled 2014-08-31: qty 20

## 2014-08-31 MED ORDER — NEOSTIGMINE METHYLSULFATE 10 MG/10ML IV SOLN
INTRAVENOUS | Status: DC | PRN
  Administered 2014-08-31: 2 mg via INTRAVENOUS
  Administered 2014-08-31 (×2): 1 mg via INTRAVENOUS

## 2014-08-31 MED ORDER — BUPIVACAINE LIPOSOME 1.3 % IJ SUSP
INTRAMUSCULAR | Status: AC
  Filled 2014-08-31: qty 20

## 2014-08-31 MED ORDER — DEXAMETHASONE SODIUM PHOSPHATE 4 MG/ML IJ SOLN
4.0000 mg | Freq: Once | INTRAMUSCULAR | Status: AC
  Administered 2014-08-31: 4 mg via INTRAVENOUS

## 2014-08-31 MED ORDER — PROMETHAZINE HCL 25 MG/ML IJ SOLN
25.0000 mg | Freq: Four times a day (QID) | INTRAMUSCULAR | Status: DC | PRN
  Administered 2014-08-31: 25 mg via INTRAVENOUS
  Filled 2014-08-31: qty 1

## 2014-08-31 MED ORDER — ONDANSETRON HCL 4 MG/2ML IJ SOLN
INTRAMUSCULAR | Status: AC
  Filled 2014-08-31: qty 2

## 2014-08-31 MED ORDER — SODIUM CHLORIDE 0.9 % IJ SOLN
INTRAMUSCULAR | Status: AC
  Filled 2014-08-31: qty 10

## 2014-08-31 MED ORDER — SODIUM CHLORIDE 0.9 % IJ SOLN: 9.0000 mL | INTRAMUSCULAR | Status: DC | PRN

## 2014-08-31 MED ORDER — ONDANSETRON HCL 4 MG/2ML IJ SOLN: 4.0000 mg | Freq: Once | INTRAMUSCULAR | Status: DC | PRN

## 2014-08-31 MED ORDER — FENTANYL CITRATE (PF) 100 MCG/2ML IJ SOLN
25.0000 ug | INTRAMUSCULAR | Status: DC | PRN
Start: 2014-08-31 — End: 2014-08-31

## 2014-08-31 MED ORDER — FENTANYL CITRATE (PF) 250 MCG/5ML IJ SOLN
INTRAMUSCULAR | Status: AC
  Filled 2014-08-31: qty 5

## 2014-08-31 MED ORDER — KETOROLAC TROMETHAMINE 30 MG/ML IJ SOLN
INTRAMUSCULAR | Status: AC
  Filled 2014-08-31: qty 1

## 2014-08-31 MED ORDER — LIDOCAINE HCL (PF) 1 % IJ SOLN
INTRAMUSCULAR | Status: AC
  Filled 2014-08-31: qty 5

## 2014-08-31 MED ORDER — KETOROLAC TROMETHAMINE 30 MG/ML IJ SOLN
30.0000 mg | Freq: Once | INTRAMUSCULAR | Status: AC
  Administered 2014-08-31: 30 mg via INTRAVENOUS

## 2014-08-31 MED ORDER — SODIUM CHLORIDE 0.9 % IV SOLN
INTRAVENOUS | Status: DC | PRN
  Administered 2014-08-31: 09:00:00 via INTRAVENOUS

## 2014-08-31 MED ORDER — MIDAZOLAM HCL 2 MG/2ML IJ SOLN
INTRAMUSCULAR | Status: AC
  Filled 2014-08-31: qty 2

## 2014-08-31 MED ORDER — BUPIVACAINE HCL (PF) 0.5 % IJ SOLN
INTRAMUSCULAR | Status: AC
  Filled 2014-08-31: qty 30

## 2014-08-31 MED ORDER — FENTANYL CITRATE (PF) 100 MCG/2ML IJ SOLN
INTRAMUSCULAR | Status: AC
  Filled 2014-08-31: qty 2

## 2014-08-31 MED ORDER — EPHEDRINE SULFATE 50 MG/ML IJ SOLN
INTRAMUSCULAR | Status: AC
  Filled 2014-08-31: qty 1

## 2014-08-31 MED ORDER — ONDANSETRON HCL 4 MG/2ML IJ SOLN
4.0000 mg | Freq: Four times a day (QID) | INTRAMUSCULAR | Status: DC | PRN
  Filled 2014-08-31 (×2): qty 2

## 2014-08-31 MED ORDER — FENTANYL CITRATE (PF) 100 MCG/2ML IJ SOLN
INTRAMUSCULAR | Status: DC | PRN
  Administered 2014-08-31: 50 ug via INTRAVENOUS
  Administered 2014-08-31 (×2): 100 ug via INTRAVENOUS
  Administered 2014-08-31 (×3): 50 ug via INTRAVENOUS
  Administered 2014-08-31: 100 ug via INTRAVENOUS

## 2014-08-31 MED ORDER — HYDROMORPHONE 0.3 MG/ML IV SOLN
INTRAVENOUS | Status: AC
  Filled 2014-08-31: qty 25

## 2014-08-31 MED ORDER — DIPHENHYDRAMINE HCL 50 MG/ML IJ SOLN: 12.5000 mg | Freq: Four times a day (QID) | INTRAMUSCULAR | Status: DC | PRN

## 2014-08-31 MED ORDER — ONDANSETRON HCL 4 MG PO TABS: 4.0000 mg | ORAL_TABLET | Freq: Four times a day (QID) | ORAL | Status: DC | PRN

## 2014-08-31 MED ORDER — ONDANSETRON HCL 4 MG/2ML IJ SOLN
4.0000 mg | Freq: Once | INTRAMUSCULAR | Status: AC
  Administered 2014-08-31: 4 mg via INTRAVENOUS

## 2014-08-31 MED ORDER — KETOROLAC TROMETHAMINE 30 MG/ML IJ SOLN: 30.0000 mg | Freq: Four times a day (QID) | INTRAMUSCULAR | Status: DC

## 2014-08-31 MED ORDER — GLYCOPYRROLATE 0.2 MG/ML IJ SOLN
INTRAMUSCULAR | Status: DC | PRN
  Administered 2014-08-31: 0.4 mg via INTRAVENOUS

## 2014-08-31 MED ORDER — NALOXONE HCL 0.4 MG/ML IJ SOLN: 0.4000 mg | INTRAMUSCULAR | Status: DC | PRN

## 2014-08-31 MED ORDER — SODIUM CHLORIDE 0.9 % IV SOLN
INTRAVENOUS | Status: DC
  Administered 2014-08-31 (×2): via INTRAVENOUS
  Administered 2014-09-01: 150 mL/h via INTRAVENOUS
  Administered 2014-09-01 – 2014-09-02 (×2): via INTRAVENOUS

## 2014-08-31 MED ORDER — KETOROLAC TROMETHAMINE 30 MG/ML IJ SOLN
30.0000 mg | Freq: Four times a day (QID) | INTRAMUSCULAR | Status: DC
  Administered 2014-08-31 – 2014-09-02 (×7): 30 mg via INTRAVENOUS
  Filled 2014-08-31 (×7): qty 1

## 2014-08-31 MED ORDER — ONDANSETRON HCL 4 MG/2ML IJ SOLN
4.0000 mg | Freq: Four times a day (QID) | INTRAMUSCULAR | Status: DC | PRN
  Administered 2014-08-31 (×2): 4 mg via INTRAVENOUS

## 2014-08-31 MED ORDER — MIDAZOLAM HCL 5 MG/5ML IJ SOLN
INTRAMUSCULAR | Status: DC | PRN
  Administered 2014-08-31: 2 mg via INTRAVENOUS

## 2014-08-31 MED ORDER — SODIUM CHLORIDE 0.9 % IV SOLN: Freq: Once | INTRAVENOUS | Status: DC

## 2014-08-31 MED ORDER — LISINOPRIL 5 MG PO TABS
5.0000 mg | ORAL_TABLET | Freq: Every day | ORAL | Status: DC
  Administered 2014-09-01: 5 mg via ORAL
  Filled 2014-08-31: qty 1

## 2014-08-31 MED ORDER — EPHEDRINE SULFATE 50 MG/ML IJ SOLN
INTRAMUSCULAR | Status: DC | PRN
  Administered 2014-08-31: 10 mg via INTRAVENOUS

## 2014-08-31 MED ORDER — 0.9 % SODIUM CHLORIDE (POUR BTL) OPTIME
TOPICAL | Status: DC | PRN
  Administered 2014-08-31: 3000 mL
  Administered 2014-08-31: 1000 mL

## 2014-08-31 MED ORDER — DIPHENHYDRAMINE HCL 12.5 MG/5ML PO ELIX: 12.5000 mg | ORAL_SOLUTION | Freq: Four times a day (QID) | ORAL | Status: DC | PRN

## 2014-08-31 MED ORDER — DEXAMETHASONE SODIUM PHOSPHATE 4 MG/ML IJ SOLN
INTRAMUSCULAR | Status: AC
  Filled 2014-08-31: qty 1

## 2014-08-31 MED ORDER — PANTOPRAZOLE SODIUM 40 MG PO TBEC
40.0000 mg | DELAYED_RELEASE_TABLET | Freq: Every day | ORAL | Status: DC
  Administered 2014-09-01 – 2014-09-02 (×2): 40 mg via ORAL
  Filled 2014-08-31 (×2): qty 1

## 2014-08-31 MED ORDER — MIDAZOLAM HCL 2 MG/2ML IJ SOLN
1.0000 mg | INTRAMUSCULAR | Status: DC | PRN
Start: 2014-08-31 — End: 2014-08-31
  Administered 2014-08-31 (×2): 2 mg via INTRAVENOUS
  Filled 2014-08-31: qty 2

## 2014-08-31 MED ORDER — LACTATED RINGERS IV SOLN
INTRAVENOUS | Status: DC
  Administered 2014-08-31 (×2): via INTRAVENOUS
  Administered 2014-08-31 (×2): 1000 mL via INTRAVENOUS

## 2014-08-31 MED ORDER — GLYCOPYRROLATE 0.2 MG/ML IJ SOLN
INTRAMUSCULAR | Status: AC
  Filled 2014-08-31: qty 2

## 2014-08-31 SURGICAL SUPPLY — 57 items
APL SKNCLS STERI-STRIP NONHPOA (GAUZE/BANDAGES/DRESSINGS) ×2
BAG HAMPER (MISCELLANEOUS) ×3 IMPLANT
BENZOIN TINCTURE PRP APPL 2/3 (GAUZE/BANDAGES/DRESSINGS) ×2 IMPLANT
BLADE 10 SAFETY STRL DISP (BLADE) ×1 IMPLANT
CLOTH BEACON ORANGE TIMEOUT ST (SAFETY) ×3 IMPLANT
COVER LIGHT HANDLE STERIS (MISCELLANEOUS) ×6 IMPLANT
DRAPE WARM FLUID 44X44 (DRAPE) ×3 IMPLANT
DRESSING TELFA 8X3 (GAUZE/BANDAGES/DRESSINGS) IMPLANT
DRSG OPSITE POSTOP 4X10 (GAUZE/BANDAGES/DRESSINGS) ×2 IMPLANT
DRSG OPSITE POSTOP 4X8 (GAUZE/BANDAGES/DRESSINGS) ×6 IMPLANT
DURAPREP 26ML APPLICATOR (WOUND CARE) ×3 IMPLANT
ELECT REM PT RETURN 9FT ADLT (ELECTROSURGICAL) ×3
ELECTRODE REM PT RTRN 9FT ADLT (ELECTROSURGICAL) ×2 IMPLANT
EVACUATOR DRAINAGE 10X20 100CC (DRAIN) IMPLANT
EVACUATOR SILICONE 100CC (DRAIN)
FORMALIN 10 PREFIL 480ML (MISCELLANEOUS) IMPLANT
GAUZE SPONGE 4X4 12PLY STRL (GAUZE/BANDAGES/DRESSINGS) IMPLANT
GLOVE BIOGEL PI IND STRL 9 (GLOVE) ×2 IMPLANT
GLOVE BIOGEL PI INDICATOR 9 (GLOVE) ×1
GLOVE ECLIPSE 9.0 STRL (GLOVE) ×3 IMPLANT
GOWN SPEC L3 XXLG W/TWL (GOWN DISPOSABLE) ×6 IMPLANT
GOWN STRL REUS W/TWL LRG LVL3 (GOWN DISPOSABLE) ×6 IMPLANT
INST SET MAJOR GENERAL (KITS) ×3 IMPLANT
IV NS IRRIG 3000ML ARTHROMATIC (IV SOLUTION) ×2 IMPLANT
KIT ROOM TURNOVER APOR (KITS) ×3 IMPLANT
MANIFOLD NEPTUNE II (INSTRUMENTS) ×3 IMPLANT
NDL HYPO 18GX1.5 BLUNT FILL (NEEDLE) ×1 IMPLANT
NDL HYPO 25X1 1.5 SAFETY (NEEDLE) ×1 IMPLANT
NEEDLE HYPO 18GX1.5 BLUNT FILL (NEEDLE) ×3 IMPLANT
NEEDLE HYPO 25X1 1.5 SAFETY (NEEDLE) ×6 IMPLANT
NS IRRIG 1000ML POUR BTL (IV SOLUTION) ×10 IMPLANT
PACK ABDOMINAL MAJOR (CUSTOM PROCEDURE TRAY) ×3 IMPLANT
PAD ARMBOARD 7.5X6 YLW CONV (MISCELLANEOUS) ×3 IMPLANT
RETRACTOR WND ALEXIS 25 LRG (MISCELLANEOUS) ×1 IMPLANT
RTRCTR WOUND ALEXIS 25CM LRG (MISCELLANEOUS) ×3
SET BASIN LINEN APH (SET/KITS/TRAYS/PACK) ×3 IMPLANT
SPONGE DRAIN TRACH 4X4 STRL 2S (GAUZE/BANDAGES/DRESSINGS) IMPLANT
SPONGE LAP 18X18 X RAY DECT (DISPOSABLE) ×4 IMPLANT
STRIP CLOSURE SKIN 1/2X4 (GAUZE/BANDAGES/DRESSINGS) ×4 IMPLANT
SUT CHROMIC 0 CT 1 (SUTURE) ×14 IMPLANT
SUT CHROMIC 2 0 CT 1 (SUTURE) ×4 IMPLANT
SUT CHROMIC 3 0 SH 27 (SUTURE) ×2 IMPLANT
SUT CHROMIC GUT BROWN 0 54 (SUTURE) IMPLANT
SUT CHROMIC GUT BROWN 0 54IN (SUTURE)
SUT ETHILON 3 0 FSL (SUTURE) IMPLANT
SUT MON AB 3-0 SH 27 (SUTURE) ×8 IMPLANT
SUT PDS AB CT VIOLET #0 27IN (SUTURE) ×4 IMPLANT
SUT PLAIN CT 1/2CIR 2-0 27IN (SUTURE) ×3 IMPLANT
SUT PROLENE 0 CT 1 30 (SUTURE) IMPLANT
SUT VIC AB 0 CT1 27 (SUTURE) ×12
SUT VIC AB 0 CT1 27XBRD ANTBC (SUTURE) IMPLANT
SUT VIC AB 0 CT1 27XCR 8 STRN (SUTURE) ×4 IMPLANT
SUT VICRYL 4 0 KS 27 (SUTURE) IMPLANT
SUT VICRYL AB 2 0 TIES (SUTURE) ×1 IMPLANT
TOWEL BLUE STERILE X RAY DET (MISCELLANEOUS) ×3 IMPLANT
TOWEL OR 17X26 4PK STRL BLUE (TOWEL DISPOSABLE) ×1 IMPLANT
TRAY FOLEY CATH SILVER 16FR (SET/KITS/TRAYS/PACK) ×3 IMPLANT

## 2014-08-31 NOTE — H&P (View-Only) (Signed)
Preoperative History and Physical  Sandra Gray is a 44 y.o. No obstetric history on file. here for surgical management of uterine fibroids..   No significant preoperative concerns. The fibroids are large and will require an open abdominal incision. The patient has been seen in our office, where preoperative evaluation has included a normal pap smear done last week, with + chlamydia culture by PCR for which pt received azithromycin 1 gm po, and negative GC by PCR,  An u/s showing an enlarged uterus with multiple fibroids including a pedunculated fibroid arising from the uterine fundus, and grossly normal ovaries, review of endometrial biopsy done earlier at prior physician showing benign endometrium with a submucosal fibroid on histology.  Proposed surgery: Abdominal supracervical hysterectomy, bilateral salpingectomy.  Past Medical History  Diagnosis Date  . Hypertension 2013    Started on lisinopril  . Asthma 2008  . Bronchitis   . Fibroids    Past Surgical History  Procedure Laterality Date  . Dilation and curettage of uterus  08/2011  . Hysteroscopy  02/15/2012    Fibroid Removal  . Hysteroscopy  may 2014   OB History  No data available  Patient denies any other pertinent gynecologic issues.   Current Outpatient Prescriptions on File Prior to Visit  Medication Sig Dispense Refill  . albuterol (PROVENTIL HFA;VENTOLIN HFA) 108 (90 BASE) MCG/ACT inhaler Inhale 2 puffs into the lungs every 6 (six) hours as needed for wheezing. 1 Inhaler 2  . ALPRAZolam (XANAX) 0.25 MG tablet Take 1 tablet (0.25 mg total) by mouth daily as needed. (Patient not taking: Reported on 08/26/2014) 30 tablet 3  . amLODipine (NORVASC) 5 MG tablet Take 1 tablet (5 mg total) by mouth daily. 90 tablet 1  . lisinopril (PRINIVIL,ZESTRIL) 5 MG tablet Take 1 tablet (5 mg total) by mouth daily. 90 tablet 1   No current facility-administered medications on file prior to visit.   No Known Allergies  Social  History:   reports that she has never smoked. She has never used smokeless tobacco. She reports that she does not drink alcohol or use illicit drugs.  Family History  Problem Relation Age of Onset  . Hypertension Mother   . Hypertension Father   . Diabetes Father   . Kidney disease Maternal Grandmother   . Cancer Maternal Grandfather     Lung - Asbestos exposure  . Diabetes Paternal Grandmother   . Heart disease Paternal Grandfather     Review of Systems: Noncontributory  PHYSICAL EXAM: Last menstrual period 08/02/2014. General appearance - alert, well appearing, and in no distress Chest - clear to auscultation, no wheezes, rales or rhonchi, symmetric air entry Heart - normal rate and regular rhythm Abdomen - soft, nontender, nondistended, no organomegaly; thickening of the abdominal wall with apparent large mass to 6 cm, above the umbilicus confirmed by u/s as a uterine fibroid enlargement. Pelvic - EFG normal, cervix smooth nonpurulent well supported. Extremities - peripheral pulses normal, no pedal edema, no clubbing or cyanosis  Labs: Results for orders placed or performed in visit on 08/26/14 (from the past 336 hour(s))  Cytology - PAP   Collection Time: 08/26/14 12:00 AM  Result Value Ref Range   CYTOLOGY - PAP PAP RESULT   Results for orders placed or performed during the hospital encounter of 08/26/14 (from the past 336 hour(s))  CBC   Collection Time: 08/26/14  1:00 PM  Result Value Ref Range   WBC 9.8 4.0 - 10.5 K/uL   RBC 4.67 3.87 -  5.11 MIL/uL   Hemoglobin 11.0 (L) 12.0 - 15.0 g/dL   HCT 36.1 36.0 - 46.0 %   MCV 77.3 (L) 78.0 - 100.0 fL   MCH 23.6 (L) 26.0 - 34.0 pg   MCHC 30.5 30.0 - 36.0 g/dL   RDW 16.1 (H) 11.5 - 15.5 %   Platelets 408 (H) 150 - 400 K/uL  Basic metabolic panel   Collection Time: 08/26/14  1:00 PM  Result Value Ref Range   Sodium 138 135 - 145 mmol/L   Potassium 4.2 3.5 - 5.1 mmol/L   Chloride 108 101 - 111 mmol/L   CO2 22 22 - 32  mmol/L   Glucose, Bld 70 65 - 99 mg/dL   BUN 14 6 - 20 mg/dL   Creatinine, Ser 1.32 (H) 0.44 - 1.00 mg/dL   Calcium 9.0 8.9 - 10.3 mg/dL   GFR calc non Af Amer 49 (L) >60 mL/min   GFR calc Af Amer 57 (L) >60 mL/min   Anion gap 8 5 - 15  hCG, serum, qualitative   Collection Time: 08/26/14  1:00 PM  Result Value Ref Range   Preg, Serum NEGATIVE NEGATIVE  Urinalysis, Routine w reflex microscopic (not at [facility])   Collection Time: 08/26/14  1:00 PM  Result Value Ref Range   Color, Urine YELLOW YELLOW   APPearance CLEAR CLEAR   Specific Gravity, Urine >1.030 (H) 1.005 - 1.030   pH 6.0 5.0 - 8.0   Glucose, UA NEGATIVE NEGATIVE mg/dL   Hgb urine dipstick SMALL (A) NEGATIVE   Bilirubin Urine NEGATIVE NEGATIVE   Ketones, ur NEGATIVE NEGATIVE mg/dL   Protein, ur NEGATIVE NEGATIVE mg/dL   Urobilinogen, UA 0.2 0.0 - 1.0 mg/dL   Nitrite NEGATIVE NEGATIVE   Leukocytes, UA NEGATIVE NEGATIVE  Urine microscopic-add on   Collection Time: 08/26/14  1:00 PM  Result Value Ref Range   Squamous Epithelial / LPF RARE RARE   WBC, UA 0-2 <3 WBC/hpf   RBC / HPF 0-2 <3 RBC/hpf   Bacteria, UA RARE RARE  Surgical pcr screen   Collection Time: 08/26/14  1:30 PM  Result Value Ref Range   MRSA, PCR NEGATIVE NEGATIVE   Staphylococcus aureus NEGATIVE NEGATIVE  Type and screen   Collection Time: 08/26/14  2:00 PM  Result Value Ref Range   ABO/RH(D) A POS    Antibody Screen NEG    Sample Expiration 09/09/2014     Imaging Studies: US Pelvis Complete  08/08/2014   GYNECOLOGIC SONOGRAM   Sandra Gray is a 44 y.o.  LMP 08/02/2014,with a hx of  mult.fibroids,patient now has a palpable abdominal mass. Patient is here  for a pelvic sonogram to check the size of the fibroids.    Uterus                   12.7 x 7.9 x 6.8 cm, anteverted uterus  w/mult.fibroids. (#1) pedunculated fundal fibroid 16 x 11.5 x 9cm,(#2) lt  pedunculated fibroid 8.3 x 4.5 x 6cm,(#3) ant mid ut, pedunculated 2.4 x  1.8 x 2.8 cm   Endometrium          5.2 mm, asymmetrical, contains a 2.4 x 1.2 x 2cm   polyp  Right ovary             5.5 x 2.5 x 3.8 cm, wnl  Left ovary                3.9 x 2.2 x 2.5 cm, wnl  Technician Comments:  US pelvis T/A: anteverted uterus w/mult.fibroids (#1) pedunculated fundal  fibroid 16 x 11.5 x 9cm,(#2) lt pedunculated fibroid 8.3 x 4.5 x 6cm,(#3)  ant mid ut, pedunculated fibroid 2.4 x 1.8 x 2.8 cm,endometrium contains  2.4 x 1.2 x 2cm polyp    Sandra Gray 08/06/2014 2:05 PM Clinical Impression and recommendations:  I have reviewed the sonogram results above, and was present during the u/s  itself, with discussion in office to follow.  Combined with the patient's current clinical course, below are my  impressions and any appropriate recommendations for management based on  the sonographic findings:  Multiple large fibroids, with a particularly large pedunculated fibroid  extending above the uterine body to above the umbilicus. Mobile mass. Polyp" noted likely represents the remainder of previously biopsied and  partially removed submucous myoma. Prior biopsies histology proven as  normal, benign, 2014. Normal ovaries bilateral.  Sandra Gray    Assessment: Patient Active Problem List   Diagnosis Date Noted  . Pelvic mass in female 08/02/2014  . Uterine fibroid 12/28/2013  . Obesity (BMI 30-39.9) 11/30/2013  . Hypertension 03/26/2012  . Asthma with bronchitis 03/26/2012  . Anxiety, mild 03/26/2012  . Routine general medical examination at a health care facility 03/26/2012    Plan: Patient will undergo surgical management with Abdominal supracervical hysterectomy, bilateral salpingectomy Rationale for surgery, alternatives strategies including the use of Lupron for preoperative reduction of uterine size all discussed with the patient as alternatives. Dr Elonda Husky to assist..    Sandra Gray  08/30/2014 5:27 PM

## 2014-08-31 NOTE — Op Note (Signed)
08/31/2014  10:53 AM  PATIENT:  Sandra Gray  44 y.o. female  PRE-OPERATIVE DIAGNOSIS:  uterine fibroids, POST-OPERATIVE DIAGNOSIS:  uterine fibroids and endometriosis, abdominal adhesions from sigmoid colon to fibroid uterus    PROCEDURE:  Procedure(s) with comments: HYSTERECTOMY SUPRACERVICAL ABDOMINAL (N/A) - Dr. Elonda Husky will assist  SURGEON:  Surgeon(s) and Role:    * [name], MD - Primary    * Florian Buff, MD - Assisting  PHYSICIAN ASSISTANT: Eure M.D.  Sonda RumbleKoleen Nimrod CST   ANESTHESIA:   general  EBL:  Total I/O In: 6283 [I.V.:3500; Blood:335] Out: 850 [Urine:50; Blood:800]  BLOOD ADMINISTERED:One unit CC PRBC  DRAINS: Urinary Catheter (Foley)   LOCAL MEDICATIONS USED:  NONE  SPECIMEN:  Source of Specimen:  Uterus  DISPOSITION OF SPECIMEN:  PATHOLOGY  COUNTS:  YES  TOURNIQUET:  * No tourniquets in log *  DICTATION: .Dragon Dictation  PLAN OF CARE: Admit to inpatient   PATIENT DISPOSITION:  PACU - hemodynamically stable.   Delay start of Pharmacological VTE agent (>24hrs) due to surgical blood loss or risk of bleeding: not applicable  Details of procedure: Details of procedure: Patient was taken operating room prepped and draped for lower abdominal surgery with Foley catheter in place. Timeout was conducted midline vertical incision was performed with excision of a 3 cm wide ellipse of skin and underlying fatty tissue, the fascia was opened in the midline and peritoneal cavity and opened carefully and no adhesions to the anterior abdominal wall identified near the umbilicus. There were adhesions in the area of the old C-section the cyst was palpated and was found to have a huge pedunculated myoma with sigmoid and descending colon adhesions to the surface of it where parasitic blood supply was significant. The stalk of the myoma was tied off 3 with 0 Vicryl to reduce blood flow to the sigmoid adhesions, than those adhesions were taken off  combination of blunt and sharp dissection. The adhesion surface was inspected and found to have multiple diffuse oozing specimen was closed with running locking 3-0 proline. The myoma was amputated off of the stump of its pedicle there was a smaller 10 cm myoma in the anterior to the uterus was immobile with very short and relatively obscured round ligaments bilaterally. On the anterior surface there was an area of vascularity that  represented parasitic supply or endometriosis. The right round ligament was taken down and the relatively thick peritoneum opened to allow crossclamping of the right utero-ovarian ligament and fallopian tube pedicle these were tied off with 0 Vicryl. On the left side the dissections more difficult. The utero-ovarian ligament and fallopian tube had to be clamped cut and tied off first with the round ligament could be accessed and then it was taken down. There was rather generous vascularity along the side which was treated with clamping with curved Heaney clamp and a second Heaney clamp used for backbleeding. Again 0 Vicryl was used to tie this down. I with a Heaney clamp was as far as we can go down on this side due to the size fibrotic tissue and lack of mobility the bladder could not be taken down anteriorly so the decision was made to amputate the uterine body off just above the bladder flap attachment. Once the right-sided been taken down with curved Heaney clamps to manage the left, then Bovie cautery on a high setting, (70) is used cauterize and amputate the uterine body off of the lower uterine segment. The area around the lower  uterine segment remnant that was left in place was then cored out using bovie cautery. A series of figure-of-eight sutures were placed across the stump to reapproximate from back surfaces with relatively good hemostasis. Pelvis was copiously irrigated the remaining pelvis was inspected and found to be hemostatic. The sigmoid area that had been peeled off  the huge myoma was reinspected and required 2 additional figure-of-eight sutures of 3-0 Monocryl to achieve hemostasis. Was again irrigated. Laparotomy equipment was removed. Decision was made that removal of fallopian tubes would be dangerous for further heavy bleeding. Hemostasis was again confirmed, and laparotomy equipment removed, anterior peritoneum closed at the peritoneal layer over layer with 2-0 chromic followed by a 0 running PDS closure of the fascia, 20 plain closure of subcutaneous fat and subcutaneous 4-0 Vicryl closure and skin incision. EBL 600 cc condition to recovery in stable patient received 1 unit transfusion during the case room hemoglobin was 10.1 in the recovery room. Sponge and needle counts were correct patient to recovery in stable  B

## 2014-08-31 NOTE — Progress Notes (Addendum)
Pt continuing to vomit, even after 4mg  Zofran IV. Paged Dr Elonda Husky regarding this. Waiting to hear back form MD. Continuing to monitor pt. Bed remains in lowest position and call bell is within reach.   2222 Dr Elonda Husky ordered Phenergan 25 mg IV Q 6 hours PRN. Order placed. Will administer as soon as available.

## 2014-08-31 NOTE — Anesthesia Preprocedure Evaluation (Signed)
Anesthesia Evaluation  Patient identified by MRN, date of birth, ID band Patient awake    Reviewed: Allergy & Precautions, NPO status , Patient's Chart, lab work & pertinent test results  Airway Mallampati: II       Dental  (+) Teeth Intact   Pulmonary asthma ,  breath sounds clear to auscultation        Cardiovascular hypertension, Pt. on medications Rhythm:Regular Rate:Normal     Neuro/Psych Anxiety    GI/Hepatic negative GI ROS,   Endo/Other    Renal/GU      Musculoskeletal   Abdominal   Peds  Hematology   Anesthesia Other Findings   Reproductive/Obstetrics                             Anesthesia Physical Anesthesia Plan  ASA: II  Anesthesia Plan: General   Post-op Pain Management:    Induction: Intravenous  Airway Management Planned: Oral ETT  Additional Equipment:   Intra-op Plan:   Post-operative Plan: Extubation in OR  Informed Consent: I have reviewed the patients History and Physical, chart, labs and discussed the procedure including the risks, benefits and alternatives for the proposed anesthesia with the patient or authorized representative who has indicated his/her understanding and acceptance.     Plan Discussed with:   Anesthesia Plan Comments:         Anesthesia Quick Evaluation

## 2014-08-31 NOTE — Progress Notes (Signed)
Day of Surgery Procedure(s) (LRB): HYSTERECTOMY SUPRACERVICAL ABDOMINAL (N/A)  Subjective: Patient reports incisional pain, tolerating PO and no problems voiding.    Objective: I have reviewed patient's vital signs, intake and output and labs. BP 111/67 mmHg  Pulse 88  Temp(Src) 97.7 F (36.5 C) (Oral)  Resp 20  Ht 5\' 2"  (1.575 m)  Wt 81.647 kg (180 lb)  BMI 32.91 kg/m2  SpO2 100%  LMP 08/02/2014  Intake/Output Summary (Last 24 hours) at 08/31/14 1811 Last data filed at 08/31/14 1128  Gross per 24 hour  Intake   4235 ml  Output   1090 ml  Net   3145 ml    General: pale Resting comfortably Assessment: s/p Procedure(s) with comments: HYSTERECTOMY SUPRACERVICAL ABDOMINAL (N/A) - Dr. Elonda Husky will assist: stable and anemia  Plan: transfuse second unit  LOS: 0 days    Sandra Gray V 08/31/2014, 6:10 PM

## 2014-08-31 NOTE — Anesthesia Postprocedure Evaluation (Signed)
  Anesthesia Post-op Note  Patient: Sandra Gray  Procedure(s) Performed: Procedure(s) with comments: HYSTERECTOMY SUPRACERVICAL ABDOMINAL (N/A) - Dr. Elonda Husky will assist  Patient Location: PACU  Anesthesia Type:General  Level of Consciousness: awake, alert , oriented and patient cooperative  Airway and Oxygen Therapy: Patient Spontanous Breathing  Post-op Pain: 3 /10, mild  Post-op Assessment: Post-op Vital signs reviewed, Patient's Cardiovascular Status Stable, Respiratory Function Stable, Patent Airway and Pain level controlled              Post-op Vital Signs: Reviewed and stable  Last Vitals:  Filed Vitals:   08/31/14 1030  BP: 111/62  Pulse: 73  Temp:   Resp: 20    Complications: No apparent anesthesia complications

## 2014-08-31 NOTE — Brief Op Note (Signed)
08/31/2014  10:53 AM  PATIENT:  Sandra Gray  44 y.o. female  PRE-OPERATIVE DIAGNOSIS:  uterine fibroids, POST-OPERATIVE DIAGNOSIS:  uterine fibroids and endometriosis, abdominal adhesions from sigmoid colon to fibroid uterus    PROCEDURE:  Procedure(s) with comments: HYSTERECTOMY SUPRACERVICAL ABDOMINAL (N/A) - Dr. Elonda Husky will assist  SURGEON:  Surgeon(s) and Role:    * [name], MD - Primary    * Florian Buff, MD - Assisting  PHYSICIAN ASSISTANT: Eure M.D.  Sonda RumbleKoleen Nimrod CST   ANESTHESIA:   general  EBL:  Total I/O In: 3300 [I.V.:3500; Blood:335] Out: 850 [Urine:50; Blood:800]  BLOOD ADMINISTERED:One unit CC PRBC  DRAINS: Urinary Catheter (Foley)   LOCAL MEDICATIONS USED:  NONE  SPECIMEN:  Source of Specimen:  Uterus  DISPOSITION OF SPECIMEN:  PATHOLOGY  COUNTS:  YES  TOURNIQUET:  * No tourniquets in log *  DICTATION: .Dragon Dictation  PLAN OF CARE: Admit to inpatient   PATIENT DISPOSITION:  PACU - hemodynamically stable.   Delay start of Pharmacological VTE agent (>24hrs) due to surgical blood loss or risk of bleeding: not applicable

## 2014-08-31 NOTE — Transfer of Care (Signed)
Immediate Anesthesia Transfer of Care Note  Patient: Sandra Gray  Procedure(s) Performed: Procedure(s) with comments: HYSTERECTOMY SUPRACERVICAL ABDOMINAL (N/A) - Dr. Elonda Husky will assist  Patient Location: PACU  Anesthesia Type:General  Level of Consciousness: awake and patient cooperative  Airway & Oxygen Therapy: Patient Spontanous Breathing and Patient connected to face mask oxygen  Post-op Assessment: Report given to RN, Post -op Vital signs reviewed and stable and Patient moving all extremities  Post vital signs: Reviewed and stable  Last Vitals:  Filed Vitals:   08/31/14 0730  BP: 103/55  Pulse:   Temp:   Resp: 14    Complications: No apparent anesthesia complications

## 2014-08-31 NOTE — Anesthesia Procedure Notes (Signed)
Procedure Name: Intubation Date/Time: 08/31/2014 7:47 AM Performed by: Charmaine Downs Pre-anesthesia Checklist: Patient identified, Emergency Drugs available, Suction available and Patient being monitored Patient Re-evaluated:Patient Re-evaluated prior to inductionOxygen Delivery Method: Circle system utilized Preoxygenation: Pre-oxygenation with 100% oxygen Intubation Type: IV induction and Cricoid Pressure applied Ventilation: Mask ventilation without difficulty and Oral airway inserted - appropriate to patient size Laryngoscope Size: Mac and 3 Grade View: Grade II Tube type: Oral Tube size: 7.0 mm Number of attempts: 1 Airway Equipment and Method: Stylet and Oral airway Placement Confirmation: ETT inserted through vocal cords under direct vision,  positive ETCO2 and breath sounds checked- equal and bilateral Secured at: 22 cm Tube secured with: Tape Dental Injury: Teeth and Oropharynx as per pre-operative assessment

## 2014-08-31 NOTE — Interval H&P Note (Signed)
History and Physical Interval Note:  08/31/2014 7:15 AM  Sandra Gray  has presented today for surgery, with the diagnosis of uterine fibroids menorrhagia endometrial thickening  The various methods of treatment have been discussed with the patient and family. After consideration of risks, benefits and other options for treatment, the patient has consented to  Procedure(s) with comments: HYSTERECTOMY SUPRACERVICAL ABDOMINAL with bilateral salpingectomy (N/A) - Dr. Elonda Husky will assist BILATERAL SALPINGECTOMY (Bilateral) as a surgical intervention .  The patient's history has been reviewed, patient examined, no change in status, stable for surgery.  I have reviewed the patient's chart and labs.  Questions were answered to the patient's satisfaction.  The patient took her magnesium Citrate yesterday at noon, and quit going to bathroom around 4 am.   [name]

## 2014-09-01 ENCOUNTER — Encounter (HOSPITAL_COMMUNITY): Payer: Self-pay | Admitting: Obstetrics and Gynecology

## 2014-09-01 LAB — BASIC METABOLIC PANEL
Anion gap: 6 (ref 5–15)
BUN: 13 mg/dL (ref 6–20)
CO2: 23 mmol/L (ref 22–32)
CREATININE: 1.19 mg/dL — AB (ref 0.44–1.00)
Calcium: 8.3 mg/dL — ABNORMAL LOW (ref 8.9–10.3)
Chloride: 109 mmol/L (ref 101–111)
GFR calc Af Amer: >60 mL/min (ref >60)
GFR calc non Af Amer: 56 mL/min — ABNORMAL LOW (ref >60)
Glucose, Bld: 92 mg/dL (ref 65–99)
Potassium: 4.2 mmol/L (ref 3.5–5.1)
Sodium: 138 mmol/L (ref 135–145)

## 2014-09-01 LAB — CBC
HCT: 34.4 % — ABNORMAL LOW (ref 36.0–46.0)
Hemoglobin: 10.8 g/dL — ABNORMAL LOW (ref 12.0–15.0)
MCH: 25.3 pg — ABNORMAL LOW (ref 26.0–34.0)
MCHC: 31.4 g/dL (ref 30.0–36.0)
MCV: 80.6 fL (ref 78.0–100.0)
PLATELETS: 266 10*3/uL (ref 150–400)
RBC: 4.27 MIL/uL (ref 3.87–5.11)
RDW: 15.7 % — AB (ref 11.5–15.5)
WBC: 16.2 10*3/uL — ABNORMAL HIGH (ref 4.0–10.5)

## 2014-09-01 MED ORDER — DEXTROSE 5 % IV SOLN
500.0000 mg | INTRAVENOUS | Status: AC
  Administered 2014-09-01 – 2014-09-02 (×2): 500 mg via INTRAVENOUS
  Filled 2014-09-01 (×2): qty 500

## 2014-09-01 NOTE — Progress Notes (Signed)
1 Day Post-Op Procedure(s) (LRB): HYSTERECTOMY SUPRACERVICAL ABDOMINAL (N/A)  Subjective: Patient reports incisional pain and no problems voiding. She did throw up several times earlier last night but has not done so in the last 12 hours . she has not tried breakfast . Pain adequately controlled with PCA. Foley catheter removed.  Objective: I have reviewed patient's vital signs, intake and output and labs. The patient had a positive Chlamydia test which was treated the day before surgery, but since she also did a bowel prep we will give her a azithromycin today to ensure that adequate chlamydial treatment has occurred General: alert, cooperative and no distress Resp: clear to auscultation bilaterally GI: normal findings: soft, non-tender, abnormal findings:  hypoactive bowel sounds and Some bowel sounds in the left upper quadrant primarily and incision: clean, dry and intact Extremities: extremities normal, atraumatic, no cyanosis or edema and Homans sign is negative, no sign of DVT Vaginal Bleeding: none  Intake/Output Summary (Last 24 hours) at 09/01/14 0758 Last data filed at 09/01/14 0700  Gross per 24 hour  Intake 6442.5 ml  Output   1840 ml  Net 4602.5 ml   700 cc urine output over the last shift of 12 hours CBC Latest Ref Rng 09/01/2014 08/31/2014 08/26/2014  WBC 4.0 - 10.5 K/uL 16.2(H) - 9.8  Hemoglobin 12.0 - 15.0 g/dL 10.8(L) 10.1(L) 11.0(L)  Hematocrit 36.0 - 46.0 % 34.4(L) 32.2(L) 36.1  Platelets 150 - 400 K/uL 266 - 408(H)       Assessment: s/p Procedure(s) with comments: HYSTERECTOMY SUPRACERVICAL ABDOMINAL (N/A) - Dr. Elonda Husky will assist  : stable and progressing well  Plan: Encourage ambulation A azithromycin to ensure that chlamydial infection was adequately treated.  Encourage ambulation, Foley DC'd, may need two or 3 day stay  LOS: 1 day    Tamirah George V 09/01/2014, 7:54 AM

## 2014-09-01 NOTE — Anesthesia Postprocedure Evaluation (Signed)
  Anesthesia Post-op Note  Patient: Sandra Gray  Procedure(s) Performed: Procedure(s) with comments: HYSTERECTOMY SUPRACERVICAL ABDOMINAL (N/A) - Dr. Elonda Husky will assist  Patient Location: room 313  Anesthesia Type:General  Level of Consciousness: awake, alert , oriented and patient cooperative  Airway and Oxygen Therapy: Patient Spontanous Breathing and Patient connected to nasal cannula oxygen  Post-op Pain: 2 /10, mild  Post-op Assessment: Post-op Vital signs reviewed, Patient's Cardiovascular Status Stable, Respiratory Function Stable, Patent Airway, No signs of Nausea or vomiting, Adequate PO intake, Pain level controlled and No headache              Post-op Vital Signs: Reviewed and stable  Last Vitals:  Filed Vitals:   09/01/14 0800  BP:   Pulse:   Temp:   Resp: 14    Complications: No apparent anesthesia complications

## 2014-09-01 NOTE — Care Management Note (Signed)
Case Management Note  Patient Details  Name: Tomasita Beevers MRN: [id number] Date of Birth: [birth year]   Expected Discharge Date:                  Expected Discharge Plan:  Home/Self Care  In-House Referral:  NA  Discharge planning Services  CM Consult  Post Acute Care Choice:  NA Choice offered to:  NA  DME Arranged:    DME Agency:     HH Arranged:    [place] Agency:     Status of Service:  Completed, signed off  Medicare Important Message Given:    Date Medicare IM Given:    Medicare IM give by:    Date Additional Medicare IM Given:    Additional Medicare Important Message give by:     If discussed at [place] of Stay Meetings, dates discussed:    Additional:  Patient is from home and independent at baseline. Pt has no HH services or DME's. Pt admitted following hysterectomy. Pt plans to return home with self care at DC. No CM needs.   Sherald Barge, RN 09/01/2014, 2:41 PM

## 2014-09-01 NOTE — Addendum Note (Signed)
Addendum  created 09/01/14 1016 by Charmaine Downs, CRNA   Modules edited: Notes Section   Notes Section:  File: [id number]

## 2014-09-01 NOTE — Progress Notes (Signed)
Patient up to bathroom voided large amount of urine. Patient ambulated in room. Sitting in recliner tolerated well.

## 2014-09-02 DIAGNOSIS — A5602 Chlamydial vulvovaginitis: Secondary | ICD-10-CM | POA: Diagnosis present

## 2014-09-02 DIAGNOSIS — A5609 Other chlamydial infection of lower genitourinary tract: Secondary | ICD-10-CM

## 2014-09-02 MED ORDER — OXYCODONE-ACETAMINOPHEN 5-325 MG PO TABS: 1.0000 | ORAL_TABLET | ORAL | Status: DC | PRN

## 2014-09-02 NOTE — Clinical Documentation Improvement (Signed)
Possible Clinical Conditions?    Expected Acute Blood Loss Anemia  Acute Blood Loss Anemia  Acute on chronic blood loss anemia  Chronic blood loss anemia  Precipitous drop in Hematocrit  Other Condition________________  Cannot Clinically Determine    Supporting Information:   Per Op Report EBL 800 mL Transfusion:   PRBCs x 1 unit in OR   Thank you, Carrolyn Meiers, [email or web address] [phone]

## 2014-09-02 NOTE — Discharge Summary (Signed)
Physician Discharge Summary  Patient ID: Sandra Gray MRN: [id number] DOB/AGE: 1970-08-24 44 y.o.  Admit date: 08/31/2014 Discharge date: 09/02/2014  Admission Diagnoses: Pelvic mass, uterine fibroids  status post Chlamydia cervicitis  Discharge Diagnoses: Same, status post hysterectomy supracervical Active Problems:   * No active hospital problems. * Endometriosis  Discharged Condition: good  Hospital Course: This 44 year old female was admitted through day surgery for abdominal supracervical hysterectomy, after bowel prep. She underwent abdominal supracervical hysterectomy but was technically difficult due to bowel adhesions, parasitic blood supply to the huge pedunculated fibroid. Additionally there was endometriosis implants on the anterior uterus primarily and the posterior cul-de-sac to a lesser degree. Supracervical hysterectomy was performed. We were unable to dissect significantly down on either side so a coring procedure was done to remove any lower uterine segment and tissue remnant to try to avoid post-hysterectomy menstrual type bleeding. Blood loss at surgery required 2 units transfusion Of significance the patient had been diagnosed with chlamydia cervicitis preoperatively and been treated shortly before surgery. This treatment was treated for repeated while in the hospital Consults:  Significant Diagnostic Studies: labs:  CBC Latest Ref Rng 09/01/2014 08/31/2014 08/26/2014  WBC 4.0 - 10.5 K/uL 16.2(H) - 9.8  Hemoglobin 12.0 - 15.0 g/dL 10.8(L) 10.1(L) 11.0(L)  Hematocrit 36.0 - 46.0 % 34.4(L) 32.2(L) 36.1  Platelets 150 - 400 K/uL 266 - 408(H)      Treatments: surgery: Abdominal supracervical hysterectomy, repeat chlamydia cervicitis treatment  Discharge Exam: Blood pressure 124/72, pulse 81, temperature 98.7 F (37.1 C), temperature source Oral, resp. rate 18, height 5\' 2"  (1.575 m), weight 180 lb (81.647 kg), last menstrual period 08/02/2014, SpO2 100 %. General  appearance: alert, cooperative and no distress Head: Normocephalic, without obvious abnormality, atraumatic Resp: clear to auscultation bilaterally Cardio: regular rate and rhythm GI: soft, non-tender; bowel sounds normal; no masses,  no organomegaly and Incision clean with dry dressing in place Skin: Skin color, texture, turgor normal. No rashes or lesions  Disposition: Final discharge disposition not confirmed  Discharge Instructions     Remove dressing in 72 hours    Complete by:  As directed      Call MD for:  difficulty breathing, headache or visual disturbances    Complete by:  As directed      Call MD for:  extreme fatigue    Complete by:  As directed      Call MD for:  persistant nausea and vomiting    Complete by:  As directed      Call MD for:  severe uncontrolled pain    Complete by:  As directed      Diet - low sodium heart healthy    Complete by:  As directed      Discharge instructions    Complete by:  As directed   Thank you temperature each evening at each morning, notify me for any temperature greater than 99.6, or notify me for any day that you're feeling worse than the day before. My cell phone is [phone] Take stool softener daily, MiraLAX 1 cap full in liquids     Driving Restrictions    Complete by:  As directed   None 1 weeks, and and then only if fully able to move legs quickly     Increase activity slowly    Complete by:  As directed      Lifting restrictions    Complete by:  As directed   15 pound weight limit until released postop  Sexual Activity Restrictions    Complete by:  As directed   None until released postop  Be sure your partner is treated            Medication List    STOP taking these medications        azithromycin 500 MG tablet  Commonly known as:  ZITHROMAX      TAKE these medications        albuterol 108 (90 BASE) MCG/ACT inhaler  Commonly known as:  PROVENTIL HFA;VENTOLIN HFA  Inhale 2 puffs into the lungs every  6 (six) hours as needed for wheezing.     ALPRAZolam 0.25 MG tablet  Commonly known as:  XANAX  Take 1 tablet (0.25 mg total) by mouth daily as needed.     amLODipine 5 MG tablet  Commonly known as:  NORVASC  Take 1 tablet (5 mg total) by mouth daily.     lisinopril 5 MG tablet  Commonly known as:  PRINIVIL,ZESTRIL  Take 1 tablet (5 mg total) by mouth daily.     oxyCODONE-acetaminophen 5-325 MG per tablet  Commonly known as:  PERCOCET/ROXICET  Take 1-2 tablets by mouth every 4 (four) hours as needed for moderate pain or severe pain.           Follow-up Information    Follow up with [name], MD In 2 weeks.   Specialties:  Obstetrics and Gynecology, Radiology   Why:  For wound re-check   Contact information:   [place] [address] [phone]       Signed: [name] 09/02/2014, 8:30 AM

## 2014-09-02 NOTE — Discharge Instructions (Signed)
Abdominal Hysterectomy, Care After Refer to this sheet in the next few weeks. These instructions provide you with information on caring for yourself after your procedure. Your health care provider may also give you more specific instructions. Your treatment has been planned according to current medical practices, but problems sometimes occur. Call your health care provider if you have any problems or questions after your procedure.  WHAT TO EXPECT AFTER THE PROCEDURE After your procedure, it is typical to have the following:  Pain.  Feeling tired.  Poor appetite.  Less interest in sex. HOME CARE INSTRUCTIONS  It takes 4-6 weeks to recover from this surgery. Make sure you follow all your health care provider's instructions. Home care instructions may include:  Take pain medicines only as directed by your health care provider. Do not take over-the-counter pain medicines without checking with your health care provider first.  Change your bandage as directed by your health care provider.  Return to your health care provider to have your sutures taken out.  Take showers instead of baths for 2-3 weeks. Ask your health care provider when it is safe to start showering.  Do not douche, use tampons, or have sexual intercourse for at least 6 weeks or until your health care provider says you can.   Follow your health care provider's advice about exercise, lifting, driving, and general activities.  Get plenty of rest and sleep.   Do not lift anything heavier than a gallon of milk (about 10 lb [4.5 kg]) for the first month after surgery.  You can resume your normal diet if your health care provider says it is okay.   Do not drink alcohol until your health care provider says you can.   If you are constipated, ask your health care provider if you can take a mild laxative.  Eating foods high in fiber may also help with constipation. Eat plenty of raw fruits and vegetables, whole grains, and  beans.  Drink enough fluids to keep your urine clear or pale yellow.   Try to have someone at home with you for the first 1-2 weeks to help around the house.  Keep all follow-up appointments. SEEK MEDICAL CARE IF:   You have chills or fever.  You have swelling, redness, or pain in the area of your incision that is getting worse.   You have pus coming from the incision.   You notice a bad smell coming from the incision or bandage.   Your incision breaks open.   You feel dizzy or light-headed.   You have pain or bleeding when you urinate.   You have persistent diarrhea.   You have persistent nausea and vomiting.   You have abnormal vaginal discharge.   You have a rash.   You have any type of abnormal reaction or develop an allergy to your medicine.   Your pain medicine is not helping.  SEEK IMMEDIATE MEDICAL CARE IF:   You have a fever and your symptoms suddenly get worse.  You have severe abdominal pain.  You have chest pain.  You have shortness of breath.  You faint.  You have pain, swelling, or redness of your leg.  You have heavy vaginal bleeding with blood clots. MAKE SURE YOU:  Understand these instructions.  Will watch your condition.  Will get help right away if you are not doing well or get worse. Document Released: 08/18/2004 Document Revised: 02/03/2013 Document Reviewed: 11/21/2012 Exit[employer] Patient Information 2015 [employer]. This information is not intended   to replace advice given to you by your health care provider. Make sure you discuss any questions you have with your health care provider.  

## 2014-09-05 LAB — TYPE AND SCREEN
ABO/RH(D): A POS
ANTIBODY SCREEN: NEGATIVE
UNIT DIVISION: 0
Unit division: 0

## 2014-09-10 ENCOUNTER — Other Ambulatory Visit: Payer: Self-pay | Admitting: Internal Medicine

## 2014-09-15 ENCOUNTER — Ambulatory Visit (INDEPENDENT_AMBULATORY_CARE_PROVIDER_SITE_OTHER): Payer: 59 | Admitting: Obstetrics and Gynecology

## 2014-09-15 ENCOUNTER — Encounter: Payer: Self-pay | Admitting: Obstetrics and Gynecology

## 2014-09-15 VITALS — BP 130/86 | Ht 62.0 in | Wt 173.0 lb

## 2014-09-15 DIAGNOSIS — Z9889 Other specified postprocedural states: Secondary | ICD-10-CM

## 2014-09-15 DIAGNOSIS — Z09 Encounter for follow-up examination after completed treatment for conditions other than malignant neoplasm: Secondary | ICD-10-CM

## 2014-09-15 NOTE — Progress Notes (Signed)
Patient ID: Sandra Gray, female   DOB: 03/09/1970, 44 y.o.   MRN: [id number] Pt here today for post op visit. Pt states that she is still really sore, and that she has had some spotting here and there.

## 2014-09-15 NOTE — Progress Notes (Signed)
Patient ID: Sandra Gray, female   DOB: 1970/11/07, 44 y.o.   MRN: [id number]  Subjective:     Sandra Gray is a 44 y.o. female who presents to the clinic 2 weeks status post supracervical hysterectomy for fibroids. Pt reports continued soreness on along the abdominal wall. She has controlled her pain with Excedrin.   Diet:       regular without difficulty. Bowel function is: normal. Pain:     Pain is controlled with current analgesics. Medications being used: Excedrin.  The following portions of the patient's history were reviewed and updated as appropriate: allergies, current medications, past family history, past medical history, past social history, past surgical history and problem list.  Review of Systems Pertinent items are noted in HPI.    Objective:    BP 130/86 mmHg  Ht 5\' 2"  (1.575 m)  Wt 173 lb (78.472 kg)  BMI 31.63 kg/m2  LMP 08/02/2014 General:  alert and cooperative  Abdomen: soft, bowel sounds active, non-tender  Incision:   healing well, no drainage, no erythema, no hernia, no seroma, no swelling, no dehiscence, incision well approximated       Pelvic: not indicated    Assessment:    Doing well postoperatively. Operative findings again reviewed. Pathology report discussed.  ENDOMETRIOSIS ON SURFACE, 1280 GM FIBROID UTERUS   Plan:    1. Continue any current medications. 2. Wound care discussed. Steri strips are still on. Advised pt to use spandex to support incision, if necessary.  3. Activity restrictions: no bending, stooping, or squatting and no lifting more than 10 pounds 4. Anticipated return to work: 4 weeks. 5. Follow up: 3 weeks for post op prior to returning to work.    This chart was scribed for [name], MD by Tula Nakayama, Medical Scribe. This patient was seen in room 2 and the patient's care was started at 9:48 AM.   I personally performed the services described in this documentation, which was SCRIBED in my presence. The  recorded information has been reviewed and considered accurate. It has been edited as necessary during review. [name], MD

## 2014-09-29 ENCOUNTER — Telehealth: Payer: Self-pay | Admitting: Obstetrics and Gynecology

## 2014-09-29 NOTE — Telephone Encounter (Signed)
Pt states since last week she has been having some vaginal spotting but yesterday had two blood clots and soaked 2 pads but none now, only noted when she wipes. Abdominal hysterectomy on 08/31/2014. Pt states has been up on her feet doing more. Informed pt to continue to monitor vaginal bleeding probably vaginal bleeding due to pt being more active. If reoccurs to call our office back. Pt to keep her post op appt for 10/06/2014. Pt verbalized understanding.

## 2014-10-06 ENCOUNTER — Other Ambulatory Visit (INDEPENDENT_AMBULATORY_CARE_PROVIDER_SITE_OTHER): Payer: 59

## 2014-10-06 ENCOUNTER — Other Ambulatory Visit: Payer: Self-pay | Admitting: Obstetrics and Gynecology

## 2014-10-06 ENCOUNTER — Ambulatory Visit (INDEPENDENT_AMBULATORY_CARE_PROVIDER_SITE_OTHER): Payer: 59 | Admitting: Obstetrics and Gynecology

## 2014-10-06 ENCOUNTER — Encounter: Payer: Self-pay | Admitting: Obstetrics and Gynecology

## 2014-10-06 VITALS — BP 128/80 | HR 76 | Wt 174.0 lb

## 2014-10-06 DIAGNOSIS — R1900 Intra-abdominal and pelvic swelling, mass and lump, unspecified site: Secondary | ICD-10-CM

## 2014-10-06 DIAGNOSIS — N83201 Unspecified ovarian cyst, right side: Secondary | ICD-10-CM

## 2014-10-06 DIAGNOSIS — N939 Abnormal uterine and vaginal bleeding, unspecified: Secondary | ICD-10-CM

## 2014-10-06 DIAGNOSIS — N800 Endometriosis of uterus: Secondary | ICD-10-CM | POA: Diagnosis not present

## 2014-10-06 DIAGNOSIS — N898 Other specified noninflammatory disorders of vagina: Secondary | ICD-10-CM

## 2014-10-06 NOTE — Progress Notes (Addendum)
Patient ID: Sandra Gray, female   DOB: Jan 28, 1971, 44 y.o.   MRN: [id number]  Subjective:  Sandra Gray is a 44 y.o. female now 5 weeks status post supracervical hysterectomy for fibroids. Prior to hysterectomy she had lots of irregular bleeding. And 2 wks ago she began to have heavy bleeding, she had been spotting since surgery. Pt had normal PAP  Review of Systems Negative for mild vaginal spotting from surgery til 2 wks ago, then heavier.   Diet:   normal   Bowel movements : normal  Pain is controlled without any medications.  Objective:  BP 128/80 mmHg  Pulse 76  Wt 174 lb (78.926 kg)  LMP 08/02/2014 General:Well developed, well nourished.  No acute distress. Abdomen: Bowel sounds normal, soft, non-tender. Pelvic Exam:    External Genitalia:  Normal.    Vagina: Normal. Adequate support    Cervix: Hard scar tissue behind uterus that appears to have bleeding coming from, atypical healing      of cervix or mass in cul de sac    Uterus: surgically removed    Adnexa/Bimanual: Normal  Incision(s):   Healing well, no drainage, no erythema, no hernia, no swelling, no dehiscence,   Examination chaperoned by Glendon Axe.   Assessment:  Post-Op 5 weeks s/p supracervical hysterectomy for fibroids. Postop care notable for mass in vaginal cul de sac.that is bleeding, will get u/s to determine size and biopsy to be done .   Plan:  1.Wound care discussed  Well healed 2. . current medications.none 3. Activity restrictions: none 4. return to work: not applicable. 5. Follow up in u/s and biopsy asap. Marland Kitchen  Ultrasound done trans vaginal. There is and area from the back lip of cervix that is thickened, beneath a small amount of cul de sac fluid, and it seems stiff. There is a right simple cyst, 6 cm assmptomatic. Left ovary is normal  Biopsies are taken from the surface of the mass, on posterior lip of cervix and cul de sac, dark blood from surface, suggestive of  endometriosis. Need to r/o cancer.

## 2014-10-06 NOTE — Progress Notes (Signed)
PELVIC US TV: 1.9 X 1.9cm hypoechoic mass post aspect of the cervical cuff,6.1 x 6 x 6.1cm simple rt ov cyst w/venous and art flow seen,normal lt ov,small amount of complex cul de sac fluid,no pain during ultrasound, ov's appear to be mobile,Dr Glo Herring was present during ultrasound.

## 2014-10-12 ENCOUNTER — Encounter: Payer: Self-pay | Admitting: Obstetrics and Gynecology

## 2014-10-12 ENCOUNTER — Ambulatory Visit (INDEPENDENT_AMBULATORY_CARE_PROVIDER_SITE_OTHER): Payer: 59 | Admitting: Obstetrics and Gynecology

## 2014-10-12 VITALS — BP 138/80 | Ht 62.0 in | Wt 172.0 lb

## 2014-10-12 DIAGNOSIS — N803 Endometriosis of pelvic peritoneum: Secondary | ICD-10-CM

## 2014-10-12 DIAGNOSIS — Z9889 Other specified postprocedural states: Secondary | ICD-10-CM

## 2014-10-12 DIAGNOSIS — IMO0002 Reserved for concepts with insufficient information to code with codable children: Secondary | ICD-10-CM | POA: Insufficient documentation

## 2014-10-12 MED ORDER — LEUPROLIDE ACETATE (3 MONTH) 11.25 MG IM KIT: 11.2500 mg | PACK | INTRAMUSCULAR | Status: DC

## 2014-10-12 NOTE — Progress Notes (Signed)
Patient ID: Sandra Gray, female   DOB: November 02, 1970, 44 y.o.   MRN: [id number]   [facility] Visit  Patient name: Sandra Gray MRN [id number]  Date of birth: [birth year]  CC & HPI:  Sandra Gray is a 44 y.o. female presenting today for  followup of vaginal bleeding and cul de sac mass noted after open supracervical hysterectomy. Pt noted at surgery to have cul de sac nodularity, but it was not recognized that the area communicated with the cul de sac.  ROS:  No pain with this endometriosis, surprisingly.  Pertinent History Reviewed:   Reviewed: Significant for normal bowel activity Medical         Past Medical History  Diagnosis Date  . Hypertension 2013    Started on lisinopril  . Asthma 2008  . Bronchitis   . Fibroids                               Surgical Hx:    Past Surgical History  Procedure Laterality Date  . Dilation and curettage of uterus  08/2011  . Hysteroscopy  02/15/2012    Fibroid Removal  . Hysteroscopy  may 2014  . Supracervical abdominal hysterectomy N/A 08/31/2014    Procedure: HYSTERECTOMY SUPRACERVICAL ABDOMINAL;  Surgeon: [name], MD;  Location: [facility];  Service: Gynecology;  Laterality: N/A;  Dr. Elonda Husky will assist   Medications: Reviewed & Updated - see associated section                       Current outpatient prescriptions:  .  albuterol (PROVENTIL HFA;VENTOLIN HFA) 108 (90 BASE) MCG/ACT inhaler, Inhale 2 puffs into the lungs every 6 (six) hours as needed for wheezing., Disp: 1 Inhaler, Rfl: 2 .  ALPRAZolam (XANAX) 0.25 MG tablet, Take 1 tablet (0.25 mg total) by mouth daily as needed. (Patient not taking: Reported on 08/26/2014), Disp: 30 tablet, Rfl: 3 .  amLODipine (NORVASC) 5 MG tablet, Take 1 tablet by mouth  daily, Disp: 90 tablet, Rfl: 1 .  lisinopril (PRINIVIL,ZESTRIL) 5 MG tablet, Take 1 tablet (5 mg total) by mouth daily., Disp: 90 tablet, Rfl: 1 .  oxyCODONE-acetaminophen (PERCOCET/ROXICET) 5-325 MG per tablet, Take  1-2 tablets by mouth every 4 (four) hours as needed for moderate pain or severe pain. (Patient not taking: Reported on 09/15/2014), Disp: 20 tablet, Rfl: 0   Social History: Reviewed -  reports that she has never smoked. She has never used smokeless tobacco.  Objective Findings:  Vitals: Blood pressure 138/80, height 5\' 2"  (1.575 m), weight 172 lb (78.019 kg), last menstrual period 08/02/2014.  Physical Examination: General appearance - alert, well appearing, and in no distress, oriented to person, place, and time, normal appearing weight and no pain Mental status - alert, oriented to person, place, and time, normal mood, behavior, speech, dress, motor activity, and thought processes  Pelvic exam not repeated. Pt denies bleeding or pain today Assessment & Plan:   A:  1. Cul de sac endometriosis, severe, with bleeding 2  S/p supracervical hyst for fibroids.  P: Lupron 11.25 injection.      Add back Aygestin 5 mg /d     Suppress x 3 months then consider laparoscopic excisison, may require referral . 1.

## 2014-10-19 ENCOUNTER — Encounter: Payer: 59 | Admitting: *Deleted

## 2014-10-19 NOTE — Progress Notes (Signed)
Pt was here for Lupron Injection. We don't have shot here. I called pharmacy and they said it required prior auth. I gave the info I could and they will fax some clinical questions for Dr. Glo Herring to answer and we will fax back to them. Once they get our fax back, it can take 72 hours-14 days to process. They will let us know if insurance will approve and if approved, pt has to call insurance company and have it delivered to our office. Pt aware. Harold

## 2014-10-21 ENCOUNTER — Encounter: Payer: Self-pay | Admitting: *Deleted

## 2014-10-21 NOTE — Progress Notes (Signed)
Patient ID: Sandra Gray, female   DOB: 1970-07-05, 44 y.o.   MRN: [id number] Pt came into the office this am requesting a return to work note for 10/26/2014, note completed copy given to pt and faxed per pt request. Also, states PA for Lupron denied, spoke with Dr. Glo Herring nurse.

## 2014-11-10 ENCOUNTER — Telehealth: Payer: Self-pay | Admitting: *Deleted

## 2014-11-10 NOTE — Telephone Encounter (Signed)
Pt returned call and I informed her that Dr. Glo Herring wanted her to know that he was looking into her chart and trying to make the right choice for her to help  her get better. Pt was also informed that Dr. Glo Herring was out of the office but supposed to come by tomorrow afternoon and discuss the pt. Pt verbalized understanding.

## 2014-11-23 ENCOUNTER — Other Ambulatory Visit: Payer: Self-pay | Admitting: Internal Medicine

## 2014-12-01 ENCOUNTER — Telehealth: Payer: Self-pay | Admitting: Obstetrics and Gynecology

## 2014-12-01 ENCOUNTER — Telehealth: Payer: Self-pay | Admitting: *Deleted

## 2014-12-01 ENCOUNTER — Other Ambulatory Visit: Payer: Self-pay | Admitting: Obstetrics and Gynecology

## 2014-12-01 MED ORDER — LEUPROLIDE ACETATE (3 MONTH) 11.25 MG IM KIT: 11.2500 mg | PACK | INTRAMUSCULAR | Status: DC

## 2014-12-01 NOTE — Telephone Encounter (Signed)
Left message for pt to call.

## 2014-12-03 ENCOUNTER — Telehealth: Payer: Self-pay | Admitting: Obstetrics and Gynecology

## 2014-12-03 NOTE — Telephone Encounter (Signed)
Dr. Glo Herring to call for prior auth.

## 2014-12-06 NOTE — Telephone Encounter (Signed)
Pt left message on 12/03/14 when she dropped by office.

## 2014-12-07 ENCOUNTER — Telehealth: Payer: Self-pay | Admitting: *Deleted

## 2014-12-07 NOTE — Telephone Encounter (Signed)
Attempting to get prior authorization completed for Lupron.

## 2014-12-10 NOTE — Telephone Encounter (Signed)
Pt aware that we are still working on her Lupron Prior auth and that hopefully she will hear good news next week. Pt verbalized understanding.

## 2015-01-05 ENCOUNTER — Encounter: Payer: Self-pay | Admitting: *Deleted

## 2015-01-05 NOTE — Progress Notes (Signed)
Patient ID: Sandra Gray, female   DOB: 1970-04-18, 44 y.o.   MRN: [id number] [employer] called and stated that due to her history with her surgery and the complications that arose they would approve the Lupron for 6 months. They will fax a confirmation letter. I will notify the pt of this.

## 2015-02-16 ENCOUNTER — Ambulatory Visit (INDEPENDENT_AMBULATORY_CARE_PROVIDER_SITE_OTHER): Payer: 59 | Admitting: Obstetrics and Gynecology

## 2015-02-16 ENCOUNTER — Encounter: Payer: Self-pay | Admitting: Obstetrics and Gynecology

## 2015-02-16 VITALS — BP 110/70 | Ht 62.0 in | Wt 184.0 lb

## 2015-02-16 DIAGNOSIS — N809 Endometriosis, unspecified: Secondary | ICD-10-CM

## 2015-02-16 DIAGNOSIS — R1030 Lower abdominal pain, unspecified: Secondary | ICD-10-CM

## 2015-02-16 DIAGNOSIS — N938 Other specified abnormal uterine and vaginal bleeding: Secondary | ICD-10-CM

## 2015-02-16 MED ORDER — LEUPROLIDE ACETATE (3 MONTH) 11.25 MG IM KIT
11.2500 mg | PACK | Freq: Once | INTRAMUSCULAR | Status: AC
  Administered 2015-02-16: 11.25 mg via INTRAMUSCULAR

## 2015-02-16 MED ORDER — NORETHINDRONE ACETATE 5 MG PO TABS: 2.5000 mg | ORAL_TABLET | Freq: Every day | ORAL | Status: DC

## 2015-02-16 NOTE — Progress Notes (Signed)
Patient ID: Sandra Gray, female   DOB: 1970/07/11, 45 y.o.   MRN: [id number] Pt here today for Lupron injection and to discuss next step.

## 2015-02-16 NOTE — Progress Notes (Signed)
Patient ID: Noella Ocejo, female   DOB: [birth year], 45 y.o.   MRN: [id number]   [facility]  Patient name: Traeh Monds MRN [id number]  Date of birth: 04-04-1970  CC & HPI:  Corren Gainey is a 45 y.o. female presenting today for endometriosis of cul de sac after supracervical hyst.pt is bleeding 2 wks per month.  ROS:  Pain is like mild cramps . Able to work No dyspareunia.   Pertinent History Reviewed:   Reviewed: Significant for supracervical hyst for fibroid.s, found to have huge endometrioma in frozen cul de sac, unable to address at hyst due to density of adhesions, depth of pelvis,. And hyper vascularity. Medical         Past Medical History  Diagnosis Date  . Hypertension 2013    Started on lisinopril  . Asthma 2008  . Bronchitis   . Fibroids                               Surgical Hx:    Past Surgical History  Procedure Laterality Date  . Dilation and curettage of uterus  08/2011  . Hysteroscopy  02/15/2012    Fibroid Removal  . Hysteroscopy  may 2014  . Supracervical abdominal hysterectomy N/A 08/31/2014    Procedure: HYSTERECTOMY SUPRACERVICAL ABDOMINAL;  Surgeon: [name], MD;  Location: [facility];  Service: Gynecology;  Laterality: N/A;  Dr. Elonda Husky will assist   Medications: Reviewed & Updated - see associated section                       Current outpatient prescriptions:  .  albuterol (PROVENTIL HFA;VENTOLIN HFA) 108 (90 BASE) MCG/ACT inhaler, Inhale 2 puffs into the lungs every 6 (six) hours as needed for wheezing., Disp: 1 Inhaler, Rfl: 2 .  amLODipine (NORVASC) 5 MG tablet, Take 1 tablet by mouth  daily, Disp: 90 tablet, Rfl: 1 .  leuprolide (LUPRON DEPOT) 11.25 MG injection, Inject 11.25 mg into the muscle every 3 (three) months., Disp: 1 each, Rfl: 0 .  lisinopril (PRINIVIL,ZESTRIL) 5 MG tablet, Take 1 tablet by mouth  daily, Disp: 90 tablet, Rfl: 3   Social History: Reviewed -  reports that she has never smoked. She has never  used smokeless tobacco.  Objective Findings:  Vitals: Blood pressure 110/70, height 5\' 2"  (1.575 m), weight 184 lb (83.462 kg), last menstrual period 08/02/2014.  Physical Examination: General appearance - alert, well appearing, and in no distress, oriented to person, place, and time and normal appearing weight Mental status - alert, oriented to person, place, and time, normal mood, behavior, speech, dress, motor activity, and thought processes  I spent 25 minutes with the visit with >than 50% spent in counseling and coordination of care. Much time spent (HOURS) getting prior approval of Lupron  Assessment & Plan:   A:  1. Endometriosis of pelvis, extending from cul de sac to vagina, s/p supracervical hyst 2 has now received first lupron 11.25. injection, will recheck in 2 month  P:  1.  Repeat exam in 2 months. 2. Aygestin 2.5 mg addback 3. Apply for second lupron shot approval

## 2015-04-22 ENCOUNTER — Ambulatory Visit (INDEPENDENT_AMBULATORY_CARE_PROVIDER_SITE_OTHER): Payer: 59 | Admitting: Obstetrics and Gynecology

## 2015-04-22 ENCOUNTER — Encounter: Payer: Self-pay | Admitting: Obstetrics and Gynecology

## 2015-04-22 VITALS — BP 120/80 | Ht 62.0 in | Wt 189.0 lb

## 2015-04-22 DIAGNOSIS — N809 Endometriosis, unspecified: Secondary | ICD-10-CM

## 2015-04-22 MED ORDER — LEUPROLIDE ACETATE (3 MONTH) 11.25 MG IM KIT: 11.2500 mg | PACK | INTRAMUSCULAR | Status: DC

## 2015-04-22 NOTE — Progress Notes (Addendum)
[facility] Visit  Patient name: Sandra Gray MRN [id number]  Date of birth: [birth year]  CC & HPI:  Sandra Gray is a 45 y.o. female presenting today for follow up on SEVERE  Endometriosis OF THE CUL DE [place]. Biopsied 09/2014, confirmed as endometriosis Pt is s/p supracervical hysterectomy 2016, and continued to bleed from the Cul de sac.Marland Kitchen Pt notes that since being seen in January for her Endometriosis, bleeding has improved. Pt has been on the Depo lupron11.25 x 8 weeks and she only had bleeding in february. Pt notes that her lupron has been approved for another round. Denies any other symptoms.  ROS:  No weight loss , has stopped bleeding since 4 wks after her Lupron shot. No vasomotor sx since add-back aygestin.  Pertinent History Reviewed:   Reviewed: Significant for  Medical         Past Medical History  Diagnosis Date  . Hypertension 2013    Started on lisinopril  . Asthma 2008  . Bronchitis   . Fibroids                               Surgical Hx:    Past Surgical History  Procedure Laterality Date  . Dilation and curettage of uterus  08/2011  . Hysteroscopy  02/15/2012    Fibroid Removal  . Hysteroscopy  may 2014  . Supracervical abdominal hysterectomy N/A 08/31/2014    Procedure: HYSTERECTOMY SUPRACERVICAL ABDOMINAL;  Surgeon: [name], MD;  Location: [facility];  Service: Gynecology;  Laterality: N/A;  Dr. Elonda Husky will assist   Medications: Reviewed & Updated - see associated section                       Current outpatient prescriptions:  .  albuterol (PROVENTIL HFA;VENTOLIN HFA) 108 (90 BASE) MCG/ACT inhaler, Inhale 2 puffs into the lungs every 6 (six) hours as needed for wheezing., Disp: 1 Inhaler, Rfl: 2 .  amLODipine (NORVASC) 5 MG tablet, Take 1 tablet by mouth  daily, Disp: 90 tablet, Rfl: 1 .  lisinopril (PRINIVIL,ZESTRIL) 5 MG tablet, Take 1 tablet by mouth  daily, Disp: 90 tablet, Rfl: 3 .  norethindrone (AYGESTIN) 5 MG tablet, Take 0.5 tablets  (2.5 mg total) by mouth daily., Disp: 90 tablet, Rfl: 2 .  leuprolide (LUPRON DEPOT) 11.25 MG injection, Inject 11.25 mg into the muscle every 3 (three) months. (Patient not taking: Reported on 04/22/2015), Disp: 1 each, Rfl: 0   Social History: Reviewed -  reports that she has never smoked. She has never used smokeless tobacco.  Objective Findings:  Vitals: Blood pressure 120/80, height 5\' 2"  (1.575 m), weight 189 lb (85.73 kg), last menstrual period 08/02/2014.  Physical Examination:  General appearance - alert, well appearing, and in no distress and oriented to person, place, and time Mental status - alert, oriented to person, place, and time, normal mood, behavior, speech, dress, motor activity, and thought processes VULVA: normal appearing vulva with no masses, tenderness or lesions,   CERVIX: nl appearing. Pushed anterior by 4 x 2.5 cm thick, firm hard fixed mass in the cul de sac at site of endometriosis  Assessment & Plan:   A:  1. Endometriosis of pelvis, extending from cul de sac to vagina, s/p supracervical hysterectomy due to fibroids, unable to remove cervix and cul de sac mass at that time due to severity of fibrosis.  P:  1.  Seek referral with [facility] and/or [facility] for surgical consideration at 6 months. 2. F/u for another lupron injection in April  By signing my name below, I, Sandra Gray, attest that this documentation has been prepared under the direction and in the presence of [name], MD. Electronically Signed: Soijett Gray, ED Scribe. 04/22/2015. 1:14 PM.  I personally performed the services described in this documentation, which was SCRIBED in my presence. The recorded information has been reviewed and considered accurate. It has been edited as necessary during review. [name], MD

## 2015-04-22 NOTE — Progress Notes (Signed)
Patient ID: Sandra Gray, female   DOB: 07/02/1970, 45 y.o.   MRN: [id number] Pt here today for follow up. Pt states that she had some bleeding in February but has not had any since.

## 2015-05-13 ENCOUNTER — Other Ambulatory Visit: Payer: Self-pay | Admitting: *Deleted

## 2015-05-13 ENCOUNTER — Encounter: Payer: Self-pay | Admitting: *Deleted

## 2015-05-13 ENCOUNTER — Ambulatory Visit (INDEPENDENT_AMBULATORY_CARE_PROVIDER_SITE_OTHER): Payer: 59 | Admitting: *Deleted

## 2015-05-13 DIAGNOSIS — Z3202 Encounter for pregnancy test, result negative: Secondary | ICD-10-CM | POA: Diagnosis not present

## 2015-05-13 DIAGNOSIS — N809 Endometriosis, unspecified: Secondary | ICD-10-CM | POA: Diagnosis not present

## 2015-05-13 LAB — POCT URINE PREGNANCY: Preg Test, Ur: NEGATIVE

## 2015-05-13 MED ORDER — LEUPROLIDE ACETATE (3 MONTH) 11.25 MG IM KIT
11.2500 mg | PACK | Freq: Once | INTRAMUSCULAR | Status: AC
  Administered 2015-05-13: 11.25 mg via INTRAMUSCULAR

## 2015-05-13 MED ORDER — ALBUTEROL SULFATE HFA 108 (90 BASE) MCG/ACT IN AERS: 2.0000 | INHALATION_SPRAY | Freq: Four times a day (QID) | RESPIRATORY_TRACT | Status: DC | PRN

## 2015-05-13 NOTE — Progress Notes (Signed)
Pt here for Lupron injection. Pt tolerated shot well. Return in 2 weeks to see Dr. Glo Herring. [place]

## 2015-05-30 ENCOUNTER — Telehealth: Payer: Self-pay | Admitting: Obstetrics and Gynecology

## 2015-05-30 ENCOUNTER — Ambulatory Visit: Payer: 59 | Admitting: Obstetrics and Gynecology

## 2015-05-31 NOTE — Telephone Encounter (Signed)
Pt will reschedule appt here for 1 wk, to allow me to discuss case with Dr Denman George.

## 2015-06-03 ENCOUNTER — Other Ambulatory Visit: Payer: Self-pay | Admitting: Internal Medicine

## 2015-06-03 ENCOUNTER — Encounter: Payer: Self-pay | Admitting: *Deleted

## 2015-06-10 ENCOUNTER — Ambulatory Visit (INDEPENDENT_AMBULATORY_CARE_PROVIDER_SITE_OTHER): Payer: 59 | Admitting: Obstetrics and Gynecology

## 2015-06-10 ENCOUNTER — Encounter: Payer: Self-pay | Admitting: Obstetrics and Gynecology

## 2015-06-10 VITALS — BP 160/100 | Ht 62.0 in | Wt 198.0 lb

## 2015-06-10 DIAGNOSIS — N804 Endometriosis of rectovaginal septum, unspecified involvement of vagina: Secondary | ICD-10-CM

## 2015-06-10 NOTE — Progress Notes (Signed)
Patient ID: Sandra Gray, female   DOB: 04/23/1970, 45 y.o.   MRN: [id number]   [facility] Visit  @DATE @            Patient name: Sandra Gray MRN [id number]  Date of birth: 10-21-1970  CC & HPI:  Sandra Gray is a 45 y.o. female presenting today for f/u on severe endometriosis of the cul de sac to vagina. Pt has been on Depo lupron for the same. Pt reports she is ready to move forward with her trachelectomy. Our excision of the cul-de-sac mass. The patient is being referred to Dr. Everitt Amber GYN oncologist for consideration of minimally invasive robotic or laparoscopic procedure  ROS:  Review of Systems  All other systems reviewed and are negative.  Pertinent History Reviewed:   Reviewed: Significant for HTN, fibroidsThe patient is status post supracervical hysterectomy. We cannot remove the cervix to the severity of the bleeding. Fibroids were huge and it was a 16 week fibroid uterus that we removed. Medical         Past Medical History  Diagnosis Date  . Hypertension 2013    Started on lisinopril  . Asthma 2008  . Bronchitis   . Fibroids                               Surgical Hx:    Past Surgical History  Procedure Laterality Date  . Dilation and curettage of uterus  08/2011  . Hysteroscopy  02/15/2012    Fibroid Removal  . Hysteroscopy  may 2014  . Supracervical abdominal hysterectomy N/A 08/31/2014    Procedure: HYSTERECTOMY SUPRACERVICAL ABDOMINAL;  Surgeon: [name], MD;  Location: [facility];  Service: Gynecology;  Laterality: N/A;  Dr. Elonda Husky will assist   Medications: Reviewed & Updated - see associated section                       Current outpatient prescriptions:  .  albuterol (PROVENTIL HFA;VENTOLIN HFA) 108 (90 Base) MCG/ACT inhaler, Inhale 2 puffs into the lungs every 6 (six) hours as needed for wheezing., Disp: 1 Inhaler, Rfl: 2 .  amLODipine (NORVASC) 5 MG tablet, Take 1 tablet by mouth  daily, Disp: 90 tablet, Rfl: 0 .  lisinopril  (PRINIVIL,ZESTRIL) 5 MG tablet, Take 1 tablet by mouth  daily, Disp: 90 tablet, Rfl: 3 .  norethindrone (AYGESTIN) 5 MG tablet, Take 2.5 mg by mouth daily., Disp: , Rfl:  .  leuprolide (LUPRON DEPOT) 11.25 MG injection, Inject 11.25 mg into the muscle every 3 (three) months. (Patient not taking: Reported on 06/10/2015), Disp: 1 each, Rfl: 0   Social History: Reviewed -  reports that she has never smoked. She has never used smokeless tobacco.  Objective Findings:  Vitals: Blood pressure 160/100, height 5\' 2"  (1.575 m), weight 198 lb (89.812 kg), last menstrual period 08/02/2014.  Physical Examination: Presents for discussion only.  Discussed with pt risks and benefits of trachelectomy. At end of discussion, pt had opportunity to ask questions and has no further questions at this time. Greater than 50% was spent in counseling and coordination of care with the patient. Total time greater than: 15 minutes  Assessment & Plan:   A:  1. Endometriosis of pelvis, extending from cul de sac to vagina, s/p supracervical hysterectomy due to fibroids, unable to remove cervix and cul de sac mass at that time due to severity of fibrosis.  P:  1. Consult for trachelectomy on 06/20/15 with Dr. Everitt Amber.      By signing my name below, I, Terressa Koyanagi, attest that this documentation has been prepared under the direction and in the presence of [name], MD. Electronically Signed: Terressa Koyanagi, ED Scribe. 06/10/2015. 1:18 PM.   I personally performed the services described in this documentation, which was SCRIBED in my presence. The recorded information has been reviewed and considered accurate. It has been edited as necessary during review. [name], MD

## 2015-06-14 DIAGNOSIS — N804 Endometriosis of rectovaginal septum, unspecified involvement of vagina: Secondary | ICD-10-CM | POA: Insufficient documentation

## 2015-06-15 NOTE — Telephone Encounter (Signed)
Unread mychart message mailed to patient 

## 2015-06-20 ENCOUNTER — Other Ambulatory Visit (HOSPITAL_BASED_OUTPATIENT_CLINIC_OR_DEPARTMENT_OTHER): Payer: 59

## 2015-06-20 ENCOUNTER — Encounter: Payer: Self-pay | Admitting: Gynecologic Oncology

## 2015-06-20 ENCOUNTER — Ambulatory Visit: Payer: 59 | Attending: Gynecologic Oncology | Admitting: Gynecologic Oncology

## 2015-06-20 VITALS — BP 137/79 | HR 89 | Temp 98.5°F | Resp 20 | Ht 62.0 in | Wt 195.6 lb

## 2015-06-20 DIAGNOSIS — N804 Endometriosis of rectovaginal septum, unspecified involvement of vagina: Secondary | ICD-10-CM

## 2015-06-20 DIAGNOSIS — N814 Uterovaginal prolapse, unspecified: Secondary | ICD-10-CM | POA: Diagnosis present

## 2015-06-20 DIAGNOSIS — Z0189 Encounter for other specified special examinations: Secondary | ICD-10-CM | POA: Diagnosis present

## 2015-06-20 DIAGNOSIS — I10 Essential (primary) hypertension: Secondary | ICD-10-CM | POA: Insufficient documentation

## 2015-06-20 DIAGNOSIS — J45909 Unspecified asthma, uncomplicated: Secondary | ICD-10-CM | POA: Insufficient documentation

## 2015-06-20 DIAGNOSIS — N809 Endometriosis, unspecified: Secondary | ICD-10-CM | POA: Diagnosis not present

## 2015-06-20 DIAGNOSIS — Z90710 Acquired absence of both cervix and uterus: Secondary | ICD-10-CM | POA: Diagnosis not present

## 2015-06-20 LAB — BASIC METABOLIC PANEL
ANION GAP: 7 meq/L (ref 3–11)
BUN: 12.4 mg/dL (ref 7.0–26.0)
CALCIUM: 9.6 mg/dL (ref 8.4–10.4)
CO2: 23 mEq/L (ref 22–29)
CREATININE: 1.2 mg/dL — AB (ref 0.6–1.1)
Chloride: 110 mEq/L — ABNORMAL HIGH (ref 98–109)
EGFR: 62 mL/min/{1.73_m2} — AB (ref >90)
GLUCOSE: 85 mg/dL (ref 70–140)
Potassium: 4.1 mEq/L (ref 3.5–5.1)
SODIUM: 140 meq/L (ref 136–145)

## 2015-06-20 NOTE — Patient Instructions (Addendum)
Your MRI is scheduled for Sunday, May 14 at [facility] on [address] with arrival at 2:10pm for a 2:30 pm appointment.  Do not wear any jewelry or hair clips (no metal).  You have rectovaginal endometriosis that is symptomatic with pain and bleeding.  There are 3 options for management:   1/ come off Lupron and follow expectantly. The endometriosis lesion may grow during this time until you pass through menopause (at approximately age 45). You will continue to have symptoms of pain and bleeding/spotting until you pass through menopause.  2/ Surgical removal of the ovaries. This will reproduce the effects of the Lupron and may allow for resolution of your symptoms, though it is unlikely to shrink the mass beyond where it is at.  3/ Surgical removal of the cervix, upper vagina, and rectum. This is a major operation with a 10% risk for formation of a colostomy (usually temporary). We would recommend removal of the ovaries at the same time to reduce the risk of reformation of endometriosis.  We will schedule an MRI for you to better understand the relationship of your organs in the pelvis to the endometriosis mass.

## 2015-06-20 NOTE — Progress Notes (Signed)
Consult Note: Gyn-Onc  Consult was requested by Dr. Glo Herring for the evaluation of Sandra Gray 45 y.o. female  CC:  Chief Complaint  Patient presents with  . Endometriosis    New Counsultation    Assessment/Plan:  [name]  is a 45 y.o.  year old with stage IV endometriosis and obliteration of the rectovaginal septum/vaginal erosion.  I had a lengthy discussion with Sandra Gray and her parents regarding her examination findings, and the disease process. It seems that her disease is entirely asymptomatic while on Lupron and only minimally symptomatic off Lupron (light vaginal spotting which is of nusance).  I discussed that there are 3 different options for proceeding with management of this condition.   #1 expectant management. We would stop the Lupron are she cannot be on ovarian suppression long-term as the cysts but for bone health. However topping Lupron would result in resumption of her prior symptoms. If these were manageable and not quality of life limiting this is not an unreasonable option. However it is likely that her endometriosis lesion may continue to grow over time, and if so a future surgical resection of this mass may be more morbid than her current procedure.  #2 surgical castration with laparoscopic oophorectomy. We would provide add back estrogen replacement which has not been associated with flare or progression of endometriosis. This is likely to simulate her current state while on Lupron. It is unlikely that she will have complete regression of her endometriosis lesion however she may have symptom control with this option. Surgical risks for this option are far less extreme pain with resection of the endometriosis.  #3 en bloc resection of the cervix, upper vagina, rectum and endometriosis. I would recommend oophorectomy at the time of such procedure for prevention of regrowth of endometriosis. I discussed the rationale of the of this procedure and the  potential risks involved including rectal anastomotic leak, colostomy formation, reoperation, ureteral or bladder injury or required resection. I discussed that this surgery would involve a minimum length of stay of approximately one week in hospital and a convalescence of 6-8 weeks in recovery. I discussed that can be long-term sequelae from complications encountered from such a procedure.  To hearing what a surgical resection of her endometriosis would involve in terms of resection of the rectum, reanastomosis, and the complications and risks and recovery involved with such a procedure the patient became very reluctant to proceed with surgical intervention as her endometriosis is only minimally symptomatic. Instead she would prefer to consider her options more closely with her family.  I'm recommending an MRI of the pelvis to better characterize the involvement of the rectum, and bilateral ureters. I will see Sandra Gray back after this imaging and we will discuss moving forward with the appropriate plan.  HPI: Sandra Gray is a very pleasant 45 year old woman who is seen in consultation at the request of Dr. Glo Herring for stage IV endometriosis.  The patient has a history of an exploratory laparotomy and supracervical hysterectomy in July 2016 by Dr. Glo Herring presymptomatic uterine fibroids. Surgical findings included endometriosis within the cul-de-sac obliterating the space and making the rectum at densely adherent to the cervix. Therefore supracervical hysterectomy was performed in the endometriosis lesion was left in situ. The surgery was otherwise uncomplicated. However she began developing persistent vaginal spotting and cyclic cramping pelvic pain. Evaluation of the vagina confirmed posterior cervical and cul-de-sac endometriosis on biopsy performed on 10/06/2014.  The patient was placed on Lupron therapy to suppress ovarian function  with the hope to cause regression of the endometriosis lesion.  This in fact resulted in no further vaginal spotting and no pelvic pain however this concern about continued long-term Lupron use in a woman remote from menopause (age 60) .  Her last Lupron dose was in April 2017.   She is otherwise fairly healthy. Her past surgical history is significant for multiple hysteroscopic myomectomies and an open supracervical hysterectomy in 2016. She has had a cesarean section 20 years ago. She has "light kidney failure" secondary to hypertension (hypertensive nephropathy).  Dr Ronette Deter is her PCP.  Current Meds:  Outpatient Encounter Prescriptions as of 06/20/2015  Medication Sig  . albuterol (PROVENTIL HFA;VENTOLIN HFA) 108 (90 Base) MCG/ACT inhaler Inhale 2 puffs into the lungs every 6 (six) hours as needed for wheezing.  Marland Kitchen amLODipine (NORVASC) 5 MG tablet Take 1 tablet by mouth  daily  . leuprolide (LUPRON DEPOT) 11.25 MG injection Inject 11.25 mg into the muscle every 3 (three) months.  Marland Kitchen lisinopril (PRINIVIL,ZESTRIL) 5 MG tablet Take 1 tablet by mouth  daily  . norethindrone (AYGESTIN) 5 MG tablet Take 2.5 mg by mouth daily.   No facility-administered encounter medications on file as of 06/20/2015.    Allergy: No Known Allergies  Social Hx:   Social History   Social History  . Marital Status: Legally Separated    Spouse Name: N/A  . Number of Children: N/A  . Years of Education: N/A   Occupational History  . Not on file.   Social History Main Topics  . Smoking status: Never Smoker   . Smokeless tobacco: Never Used  . Alcohol Use: No     Comment: Rarely  . Drug Use: No  . Sexual Activity: Not Currently    Birth Control/ Protection: Condom   Other Topics Concern  . Not on file   Social History Narrative    Past Surgical Hx:  Past Surgical History  Procedure Laterality Date  . Dilation and curettage of uterus  08/2011  . Hysteroscopy  02/15/2012    Fibroid Removal  . Hysteroscopy  may 2014  . Supracervical abdominal  hysterectomy N/A 08/31/2014    Procedure: HYSTERECTOMY SUPRACERVICAL ABDOMINAL;  Surgeon: [name], MD;  Location: [facility];  Service: Gynecology;  Laterality: N/A;  Dr. Elonda Husky will assist    Past Medical Hx:  Past Medical History  Diagnosis Date  . Hypertension 2013    Started on lisinopril  . Asthma 2008  . Bronchitis   . Fibroids     Past Gynecological History:  C/s x 1. Fibroids, endometriosis  Patient's last menstrual period was 08/02/2014.  Family Hx:  Family History  Problem Relation Age of Onset  . Hypertension Mother   . Hypertension Father   . Diabetes Father   . Kidney disease Maternal Grandmother   . Cancer Maternal Grandfather     Lung - Asbestos exposure  . Diabetes Paternal Grandmother   . Heart disease Paternal Grandfather     Review of Systems:  Constitutional  Feels well,    ENT Normal appearing ears and nares bilaterally Skin/Breast  No rash, sores, jaundice, itching, dryness Cardiovascular  No chest pain, shortness of breath, or edema  Pulmonary  No cough or wheeze.  Gastro Intestinal  No nausea, vomitting, or diarrhoea. No bright red blood per rectum, no abdominal pain, change in bowel movement, or constipation.  Genito Urinary  No frequency, urgency, dysuria, + vaginal spotting Musculo Skeletal  No myalgia, arthralgia, joint swelling  or pain  Neurologic  No weakness, numbness, change in gait,  Psychology  No depression, anxiety, insomnia.   Vitals:  Blood pressure 137/79, pulse 89, temperature 98.5 F (36.9 C), temperature source Oral, resp. rate 20, height 5\' 2"  (1.575 m), weight 195 lb 9.6 oz (88.724 kg), last menstrual period 08/02/2014, SpO2 100 %.  Physical Exam: WD in NAD Neck  Supple NROM, without any enlargements.  Lymph Node Survey No cervical supraclavicular or inguinal adenopathy Cardiovascular  Pulse normal rate, regularity and rhythm. S1 and S2 normal.  Lungs  Clear to auscultation bilateraly, without  wheezes/crackles/rhonchi. Good air movement.  Skin  No rash/lesions/breakdown  Psychiatry  Alert and oriented to person, place, and time  Abdomen  Normoactive bowel sounds, abdomen soft, non-tender and obese without evidence of hernia.  Back No CVA tenderness Genito Urinary  Vulva/vagina: Normal external female genitalia.  No lesions. No discharge or bleeding.  Bladder/urethra:  No lesions or masses, well supported bladder  Vagina: There is a 4cm erosive lesion on the posterior upper vaginal cuff that is densely adherent to the posterior cervix and rectum. I  Cervix: appears separate from vaginal erosion of endometriosis  Uterus:  Surgically absent  Adnexa: 6cm minimally mobile central mass from upper vagina to rectum Rectal  Densely adherent to posterior upper vaginal nodule. Extremities  No bilateral cyanosis, clubbing or edema.   Donaciano Eva, MD  06/20/2015, 6:24 PM   CC: Dr Glo Herring

## 2015-06-24 ENCOUNTER — Ambulatory Visit: Payer: 59 | Admitting: Obstetrics and Gynecology

## 2015-06-26 ENCOUNTER — Ambulatory Visit
Admission: RE | Admit: 2015-06-26 | Discharge: 2015-06-26 | Disposition: A | Discharge status: Home / Self Care | Payer: 59 | Source: Ambulatory Visit | Attending: Gynecologic Oncology | Admitting: Gynecologic Oncology

## 2015-06-26 DIAGNOSIS — N804 Endometriosis of rectovaginal septum, unspecified involvement of vagina: Secondary | ICD-10-CM

## 2015-06-26 MED ORDER — GADOBENATE DIMEGLUMINE 529 MG/ML IV SOLN
15.0000 mL | Freq: Once | INTRAVENOUS | Status: AC | PRN
  Administered 2015-06-26: 15 mL via INTRAVENOUS

## 2015-06-27 ENCOUNTER — Encounter: Payer: Self-pay | Admitting: Gynecologic Oncology

## 2015-06-27 ENCOUNTER — Ambulatory Visit: Payer: 59 | Attending: Gynecologic Oncology | Admitting: Gynecologic Oncology

## 2015-06-27 VITALS — BP 127/79 | HR 99 | Temp 98.5°F | Resp 20 | Ht 62.0 in | Wt 197.2 lb

## 2015-06-27 DIAGNOSIS — N804 Endometriosis of rectovaginal septum, unspecified involvement of vagina: Secondary | ICD-10-CM

## 2015-06-27 DIAGNOSIS — I10 Essential (primary) hypertension: Secondary | ICD-10-CM | POA: Insufficient documentation

## 2015-06-27 DIAGNOSIS — N809 Endometriosis, unspecified: Secondary | ICD-10-CM | POA: Insufficient documentation

## 2015-06-27 DIAGNOSIS — Z888 Allergy status to other drugs, medicaments and biological substances status: Secondary | ICD-10-CM | POA: Insufficient documentation

## 2015-06-27 DIAGNOSIS — Z9889 Other specified postprocedural states: Secondary | ICD-10-CM | POA: Diagnosis not present

## 2015-06-27 DIAGNOSIS — J45909 Unspecified asthma, uncomplicated: Secondary | ICD-10-CM | POA: Diagnosis not present

## 2015-06-27 DIAGNOSIS — Z79899 Other long term (current) drug therapy: Secondary | ICD-10-CM | POA: Diagnosis not present

## 2015-06-27 DIAGNOSIS — Z90710 Acquired absence of both cervix and uterus: Secondary | ICD-10-CM | POA: Diagnosis not present

## 2015-06-27 NOTE — Progress Notes (Signed)
Follow-up Note: Gyn-Onc  Consult was requested by Dr. Glo Herring for the evaluation of Sandra Gray 45 y.o. female  CC:  Chief Complaint  Patient presents with  . Follow-up    Endometriosis  counseling regarding results.  Assessment/Plan:  Sandra Gray  is a 45 y.o.  year old with stage IV endometriosis and obliteration of the rectovaginal septum/vaginal erosion.  Her MRI shows a 5x2.5x3.6cm implant of endometriosis in the posterior wall of the cervix and the ventral wall of the rectum. The wall of the rectum is involved, and in order to resect this lesion, an enbloc trachelectomy with low anterior resection with coloproctostomy would be required.   The patient is extremely reticent to undergo such a procedure as she is very concerned about the risks and recovery involved with such a procedure. I explained that the endometriosis lesion could continue to grow if left in situ.  An option to aid in its reduction in symptoms might be surgical menopause/castration with BSO. The patient is very interested in this.  She was informed regarding the risks and recovery of such a procedure.  We have scheduled her for a robotic assisted lysis of adhesions and BSO.   We will observe her symptoms postoperatively. I will provide her with add back estrogen replacement to prevent surgical menopause.   If this procedure results in no resolution of her symptoms of persistent spotting, she can reconsider the trachelectomy, low anterior resection procedure.  HPI: Sandra Gray is a very pleasant 45 year old woman who is seen in consultation at the request of Dr. Glo Herring for stage IV endometriosis.  The patient has a history of an exploratory laparotomy and supracervical hysterectomy in July 2016 by Dr. Glo Herring presymptomatic uterine fibroids. Surgical findings included endometriosis within the cul-de-sac obliterating the space and making the rectum at densely adherent to the cervix. Therefore  supracervical hysterectomy was performed in the endometriosis lesion was left in situ. The surgery was otherwise uncomplicated. However she began developing persistent vaginal spotting and cyclic cramping pelvic pain. Evaluation of the vagina confirmed posterior cervical and cul-de-sac endometriosis on biopsy performed on 10/06/2014.  The patient was placed on Lupron therapy to suppress ovarian function with the hope to cause regression of the endometriosis lesion. This in fact resulted in no further vaginal spotting and no pelvic pain however this concern about continued long-term Lupron use in a woman remote from menopause (age 45) .  Her last Lupron dose was in April 2017.   She is otherwise fairly healthy. Her past surgical history is significant for multiple hysteroscopic myomectomies and an open supracervical hysterectomy in 2016. She has had a cesarean section 20 years ago. She has "light kidney failure" secondary to hypertension (hypertensive nephropathy).  Dr Ronette Deter is her PCP.  Interval Hx:  On 06/26/15 she underwent MRI of the pelvis. It identified a 5x2.5x3.6cm endometriosis lesion between the posterior wall of the cervix and the anterior rectal wall.   Current Meds:  Outpatient Encounter Prescriptions as of 06/27/2015  Medication Sig  . albuterol (PROVENTIL HFA;VENTOLIN HFA) 108 (90 Base) MCG/ACT inhaler Inhale 2 puffs into the lungs every 6 (six) hours as needed for wheezing.  Marland Kitchen amLODipine (NORVASC) 5 MG tablet Take 1 tablet by mouth  daily  . leuprolide (LUPRON DEPOT) 11.25 MG injection Inject 11.25 mg into the muscle every 3 (three) months.  Marland Kitchen lisinopril (PRINIVIL,ZESTRIL) 5 MG tablet Take 1 tablet by mouth  daily  . norethindrone (AYGESTIN) 5 MG tablet Take 2.5 mg by mouth  daily.   No facility-administered encounter medications on file as of 06/27/2015.    Allergy:  Allergies  Allergen Reactions  . Gadolinium Derivatives Nausea Only    Dry heaves.      Social  Hx:   Social History   Social History  . Marital Status: Legally Separated    Spouse Name: N/A  . Number of Children: N/A  . Years of Education: N/A   Occupational History  . Not on file.   Social History Main Topics  . Smoking status: Never Smoker   . Smokeless tobacco: Never Used  . Alcohol Use: No     Comment: Rarely  . Drug Use: No  . Sexual Activity: Not Currently    Birth Control/ Protection: Condom   Other Topics Concern  . Not on file   Social History Narrative    Past Surgical Hx:  Past Surgical History  Procedure Laterality Date  . Dilation and curettage of uterus  08/2011  . Hysteroscopy  02/15/2012    Fibroid Removal  . Hysteroscopy  may 2014  . Supracervical abdominal hysterectomy N/A 08/31/2014    Procedure: HYSTERECTOMY SUPRACERVICAL ABDOMINAL;  Surgeon: [name], MD;  Location: [facility];  Service: Gynecology;  Laterality: N/A;  Dr. Elonda Husky will assist    Past Medical Hx:  Past Medical History  Diagnosis Date  . Hypertension 2013    Started on lisinopril  . Asthma 2008  . Bronchitis   . Fibroids     Past Gynecological History:  C/s x 1. Fibroids, endometriosis  Patient's last menstrual period was 08/02/2014.  Family Hx:  Family History  Problem Relation Age of Onset  . Hypertension Mother   . Hypertension Father   . Diabetes Father   . Kidney disease Maternal Grandmother   . Cancer Maternal Grandfather     Lung - Asbestos exposure  . Diabetes Paternal Grandmother   . Heart disease Paternal Grandfather     Review of Systems:  Constitutional  Feels well,    ENT Normal appearing ears and nares bilaterally Skin/Breast  No rash, sores, jaundice, itching, dryness Cardiovascular  No chest pain, shortness of breath, or edema  Pulmonary  No cough or wheeze.  Gastro Intestinal  No nausea, vomitting, or diarrhoea. No bright red blood per rectum, no abdominal pain, change in bowel movement, or constipation.  Genito Urinary  No  frequency, urgency, dysuria, + vaginal spotting Musculo Skeletal  No myalgia, arthralgia, joint swelling or pain  Neurologic  No weakness, numbness, change in gait,  Psychology  No depression, anxiety, insomnia.   Vitals:  Blood pressure 127/79, pulse 99, temperature 98.5 F (36.9 C), temperature source Oral, resp. rate 20, height 5\' 2"  (1.575 m), weight 197 lb 3.2 oz (89.449 kg), last menstrual period 08/02/2014, SpO2 100 %.  Physical Exam: WD in NAD Neck  Supple NROM, without any enlargements.  Lymph Node Survey No cervical supraclavicular or inguinal adenopathy Cardiovascular  Pulse normal rate, regularity and rhythm. S1 and S2 normal.  Lungs  Clear to auscultation bilateraly, without wheezes/crackles/rhonchi. Good air movement.  Skin  No rash/lesions/breakdown  Psychiatry  Alert and oriented to person, place, and time  Abdomen  Normoactive bowel sounds, abdomen soft, non-tender and obese without evidence of hernia.  Back No CVA tenderness Genito Urinary  Vulva/vagina: deferred Rectal  deferred Extremities  No bilateral cyanosis, clubbing or edema.   Donaciano Eva, MD  06/27/2015, 3:10 PM   CC: Dr Glo Herring

## 2015-06-27 NOTE — Patient Instructions (Signed)
Preparing for your Surgery  Plan for surgery on June 15 with Dr. Everitt Amber at [place] will be scheduled for a robotic assisted bilateral salpingo-oophorectomy, lysis of adhesions (scar tissue).  Pre-operative Testing -You will receive a phone call from presurgical testing at [facility] to arrange for a pre-operative testing appointment before your surgery.  This appointment normally occurs one to two weeks before your scheduled surgery.   -Bring your insurance card, copy of an advanced directive if applicable, medication list  -At that visit, you will be asked to sign a consent for a possible blood transfusion in case a transfusion becomes necessary during surgery.  The need for a blood transfusion is rare but having consent is a necessary part of your care.     -You should not be taking blood thinners or aspirin at least ten days prior to surgery unless instructed by your surgeon.  Day Before Surgery at [place] will be asked to take in a light diet the day before surgery.  Avoid carbonated beverages.  You will be advised to have nothing to eat or drink after midnight the evening before.     Eat a light diet the day before surgery.  Examples including soups, broths, toast, yogurt, mashed potatoes.  Things to avoid include carbonated beverages (fizzy beverages), raw fruits and raw vegetables, or beans.    If your bowels are filled with gas, your surgeon will have difficulty visualizing your pelvic organs which increases your surgical risks.  Your role in recovery Your role is to become active as soon as directed by your doctor, while still giving yourself time to heal.  Rest when you feel tired. You will be asked to do the following in order to speed your recovery:  - Cough and breathe deeply. This helps toclear and expand your lungs and can prevent pneumonia. You may be given a spirometer to practice deep breathing. A staff member will show you how  to use the spirometer. - Do mild physical activity. Walking or moving your legs help your circulation and body functions return to normal. A staff member will help you when you try to walk and will provide you with simple exercises. Do not try to get up or walk alone the first time. - Actively manage your pain. Managing your pain lets you move in comfort. We will ask you to rate your pain on a scale of zero to 10. It is your responsibility to tell your doctor or nurse where and how much you hurt so your pain can be treated.  Special Considerations -If you are diabetic, you may be placed on insulin after surgery to have closer control over your blood sugars to promote healing and recovery.  This does not mean that you will be discharged on insulin.  If applicable, your oral antidiabetics will be resumed when you are tolerating a solid diet.  -Your final pathology results from surgery should be available by the Friday after surgery and the results will be relayed to you when available.  Blood Transfusion Information WHAT IS A BLOOD TRANSFUSION? A transfusion is the replacement of blood or some of its parts. Blood is made up of multiple cells which provide different functions.  Red blood cells carry oxygen and are used for blood loss replacement.  White blood cells fight against infection.  Platelets control bleeding.  Plasma helps clot blood.  Other blood products are available for specialized needs, such as hemophilia or other clotting  disorders. BEFORE THE TRANSFUSION  Who gives blood for transfusions?   You may be able to donate blood to be used at a later date on yourself (autologous donation).  Relatives can be asked to donate blood. This is generally not any safer than if you have received blood from a stranger. The same precautions are taken to ensure safety when a relative's blood is donated.  Healthy volunteers who are fully evaluated to make sure their blood is safe. This is  blood bank blood. Transfusion therapy is the safest it has ever been in the practice of medicine. Before blood is taken from a donor, a complete history is taken to make sure that person has no history of diseases nor engages in risky social behavior (examples are intravenous drug use or sexual activity with multiple partners). The donor's travel history is screened to minimize risk of transmitting infections, such as malaria. The donated blood is tested for signs of infectious diseases, such as HIV and hepatitis. The blood is then tested to be sure it is compatible with you in order to minimize the chance of a transfusion reaction. If you or a relative donates blood, this is often done in anticipation of surgery and is not appropriate for emergency situations. It takes many days to process the donated blood. RISKS AND COMPLICATIONS Although transfusion therapy is very safe and saves many lives, the main dangers of transfusion include:   Getting an infectious disease.  Developing a transfusion reaction. This is an allergic reaction to something in the blood you were given. Every precaution is taken to prevent this. The decision to have a blood transfusion has been considered carefully by your caregiver before blood is given. Blood is not given unless the benefits outweigh the risks.

## 2015-06-28 ENCOUNTER — Encounter: Payer: Self-pay | Admitting: Gynecologic Oncology

## 2015-07-01 ENCOUNTER — Ambulatory Visit: Payer: 59 | Admitting: Obstetrics and Gynecology

## 2015-07-08 ENCOUNTER — Telehealth: Payer: Self-pay | Admitting: Internal Medicine

## 2015-07-08 NOTE — Telephone Encounter (Signed)
Patient has been seen for GYN issues, chart notes are in [facility] for you to follow. thanks

## 2015-07-08 NOTE — Telephone Encounter (Signed)
Pt called wanting to inform Dr Gilford Rile that you have been seeing her GYN for medical problems and she states you can view her chart if needed.   Call pt @ [phone]. A voicemail can be left secured pt can not answer until after 4:30p. Thank you!

## 2015-07-14 ENCOUNTER — Encounter: Payer: Self-pay | Admitting: Gynecologic Oncology

## 2015-07-19 NOTE — Patient Instructions (Addendum)
Sandra Gray  07/19/2015   Your procedure is scheduled on: 07/28/2015    Report to [facility] Main  Entrance take [place]  elevators to 3rd floor to  [place] at [place] AM.  Call this number if you have problems the morning of surgery [phone]   Remember: ONLY 1 PERSON MAY GO WITH YOU TO SHORT STAY TO GET  READY MORNING OF [place].  Do not eat food or drink liquids :After Midnight. Light diet day before surgery.  Soups, broths, liquids, toast, yogurt and mashed potatoes.  Things to avoid include: Carbonated beverages and raw fruits and vegetables.       Take these medicines the morning of surgery with A SIP OF WATER:   Albuterol Inhaler if needed and bring, Amlodipine ( Norvasc)                                You may not have any metal on your body including hair pins and              piercings  Do not wear jewelry, make-up, lotions, powders or perfumes, deodorant             Do not wear nail polish.  Do not shave  48 hours prior to surgery.     Do not bring valuables to the hospital. [place].  Contacts, dentures or bridgework may not be worn into surgery.  Leave suitcase in the car. After surgery it may be brought to your room.        Special Instructions: coughing and deep breathing exercises, leg exercises               Please read over the following fact sheets you were given: _____________________________________________________________________             [facility] - Preparing for Surgery Before surgery, you can play an important role.  Because skin is not sterile, your skin needs to be as free of germs as possible.  You can reduce the number of germs on your skin by washing with CHG (chlorahexidine gluconate) soap before surgery.  CHG is an antiseptic cleaner which kills germs and bonds with the skin to continue killing germs even after washing. Please DO NOT use if you have an  allergy to CHG or antibacterial soaps.  If your skin becomes reddened/irritated stop using the CHG and inform your nurse when you arrive at Short Stay. Do not shave (including legs and underarms) for at least 48 hours prior to the first CHG shower.  You may shave your face/neck. Please follow these instructions carefully:  1.  Shower with CHG Soap the night before surgery and the  morning of Surgery.  2.  If you choose to wash your hair, wash your hair first as usual with your  normal  shampoo.  3.  After you shampoo, rinse your hair and body thoroughly to remove the  shampoo.                           4.  Use CHG as you would any other liquid soap.  You can apply chg directly  to the skin and wash  Gently with a scrungie or clean washcloth.  5.  Apply the CHG Soap to your body ONLY FROM THE NECK DOWN.   Do not use on face/ open                           Wound or open sores. Avoid contact with eyes, ears mouth and genitals (private parts).                       Wash face,  Genitals (private parts) with your normal soap.             6.  Wash thoroughly, paying special attention to the area where your surgery  will be performed.  7.  Thoroughly rinse your body with warm water from the neck down.  8.  DO NOT shower/wash with your normal soap after using and rinsing off  the CHG Soap.                9.  Pat yourself dry with a clean towel.            10.  Wear clean pajamas.            11.  Place clean sheets on your bed the night of your first shower and do not  sleep with pets. Day of Surgery : Do not apply any lotions/deodorants the morning of surgery.  Please wear clean clothes to the hospital/surgery center.  FAILURE TO FOLLOW THESE INSTRUCTIONS MAY RESULT IN THE CANCELLATION OF YOUR SURGERY PATIENT SIGNATURE_________________________________  NURSE  SIGNATURE__________________________________  ________________________________________________________________________  WHAT IS A BLOOD TRANSFUSION? Blood Transfusion Information  A transfusion is the replacement of blood or some of its parts. Blood is made up of multiple cells which provide different functions.  Red blood cells carry oxygen and are used for blood loss replacement.  White blood cells fight against infection.  Platelets control bleeding.  Plasma helps clot blood.  Other blood products are available for specialized needs, such as hemophilia or other clotting disorders. BEFORE THE TRANSFUSION  Who gives blood for transfusions?   Healthy volunteers who are fully evaluated to make sure their blood is safe. This is blood bank blood. Transfusion therapy is the safest it has ever been in the practice of medicine. Before blood is taken from a donor, a complete history is taken to make sure that person has no history of diseases nor engages in risky social behavior (examples are intravenous drug use or sexual activity with multiple partners). The donor's travel history is screened to minimize risk of transmitting infections, such as malaria. The donated blood is tested for signs of infectious diseases, such as HIV and hepatitis. The blood is then tested to be sure it is compatible with you in order to minimize the chance of a transfusion reaction. If you or a relative donates blood, this is often done in anticipation of surgery and is not appropriate for emergency situations. It takes many days to process the donated blood. RISKS AND COMPLICATIONS Although transfusion therapy is very safe and saves many lives, the main dangers of transfusion include:  1. Getting an infectious disease. 2. Developing a transfusion reaction. This is an allergic reaction to something in the blood you were given. Every precaution is taken to prevent this. The decision to have a blood transfusion has been  considered carefully by your caregiver before blood is given. Blood is not given unless the benefits  outweigh the risks. AFTER THE TRANSFUSION  Right after receiving a blood transfusion, you will usually feel much better and more energetic. This is especially true if your red blood cells have gotten low (anemic). The transfusion raises the level of the red blood cells which carry oxygen, and this usually causes an energy increase.  The nurse administering the transfusion will monitor you carefully for complications. HOME CARE INSTRUCTIONS  No special instructions are needed after a transfusion. You may find your energy is better. Speak with your caregiver about any limitations on activity for underlying diseases you may have. SEEK MEDICAL CARE IF:   Your condition is not improving after your transfusion.  You develop redness or irritation at the intravenous (IV) site. SEEK IMMEDIATE MEDICAL CARE IF:  Any of the following symptoms occur over the next 12 hours:  Shaking chills.  You have a temperature by mouth above 102 F (38.9 C), not controlled by medicine.  Chest, back, or muscle pain.  People around you feel you are not acting correctly or are confused.  Shortness of breath or difficulty breathing.  Dizziness and fainting.  You get a rash or develop hives.  You have a decrease in urine output.  Your urine turns a dark color or changes to pink, red, or brown. Any of the following symptoms occur over the next 10 days:  You have a temperature by mouth above 102 F (38.9 C), not controlled by medicine.  Shortness of breath.  Weakness after normal activity.  The white part of the eye turns yellow (jaundice).  You have a decrease in the amount of urine or are urinating less often.  Your urine turns a dark color or changes to pink, red, or brown. Document Released: 01/27/2000 Document Revised: 04/23/2011 Document Reviewed: 09/15/2007 ExitCare Patient Information 2014  [place].  _______________________________________________________________________  Incentive Spirometer  An incentive spirometer is a tool that can help keep your lungs clear and active. This tool measures how well you are filling your lungs with each breath. Taking long deep breaths may help reverse or decrease the chance of developing breathing (pulmonary) problems (especially infection) following:  A long period of time when you are unable to move or be active. BEFORE THE PROCEDURE   If the spirometer includes an indicator to show your best effort, your nurse or respiratory therapist will set it to a desired goal.  If possible, sit up straight or lean slightly forward. Try not to slouch.  Hold the incentive spirometer in an upright position. INSTRUCTIONS FOR USE  3. Sit on the edge of your bed if possible, or sit up as far as you can in bed or on a chair. 4. Hold the incentive spirometer in an upright position. 5. Breathe out normally. 6. Place the mouthpiece in your mouth and seal your lips tightly around it. 7. Breathe in slowly and as deeply as possible, raising the piston or the ball toward the top of the column. 8. Hold your breath for 3-5 seconds or for as long as possible. Allow the piston or ball to fall to the bottom of the column. 9. Remove the mouthpiece from your mouth and breathe out normally. 10. Rest for a few seconds and repeat Steps 1 through 7 at least 10 times every 1-2 hours when you are awake. Take your time and take a few normal breaths between deep breaths. 11. The spirometer may include an indicator to show your best effort. Use the indicator as a goal to work  toward during each repetition. 12. After each set of 10 deep breaths, practice coughing to be sure your lungs are clear. If you have an incision (the cut made at the time of surgery), support your incision when coughing by placing a pillow or rolled up towels firmly against it. Once you are able to  get out of bed, walk around indoors and cough well. You may stop using the incentive spirometer when instructed by your caregiver.  RISKS AND COMPLICATIONS  Take your time so you do not get dizzy or light-headed.  If you are in pain, you may need to take or ask for pain medication before doing incentive spirometry. It is harder to take a deep breath if you are having pain. AFTER USE  Rest and breathe slowly and easily.  It can be helpful to keep track of a log of your progress. Your caregiver can provide you with a simple table to help with this. If you are using the spirometer at home, follow these instructions: [place] IF:   You are having difficultly using the spirometer.  You have trouble using the spirometer as often as instructed.  Your pain medication is not giving enough relief while using the spirometer.  You develop fever of 100.5 F (38.1 C) or higher. SEEK IMMEDIATE MEDICAL CARE IF:   You cough up bloody sputum that had not been present before.  You develop fever of 102 F (38.9 C) or greater.  You develop worsening pain at or near the incision site. MAKE SURE YOU:   Understand these instructions.  Will watch your condition.  Will get help right away if you are not doing well or get worse. Document Released: 06/11/2006 Document Revised: 04/23/2011 Document Reviewed: 08/12/2006 [facility] Patient Information 2014 [place], [place].   ________________________________________________________________________

## 2015-07-22 ENCOUNTER — Encounter (HOSPITAL_COMMUNITY)
Admission: RE | Admit: 2015-07-22 | Discharge: 2015-07-22 | Disposition: A | Discharge status: Home / Self Care | Payer: 59 | Source: Ambulatory Visit | Attending: Gynecologic Oncology | Admitting: Gynecologic Oncology

## 2015-07-22 ENCOUNTER — Encounter (HOSPITAL_COMMUNITY): Payer: Self-pay

## 2015-07-22 DIAGNOSIS — Z01812 Encounter for preprocedural laboratory examination: Secondary | ICD-10-CM | POA: Diagnosis not present

## 2015-07-22 DIAGNOSIS — Z0183 Encounter for blood typing: Secondary | ICD-10-CM | POA: Diagnosis not present

## 2015-07-22 DIAGNOSIS — N809 Endometriosis, unspecified: Secondary | ICD-10-CM | POA: Insufficient documentation

## 2015-07-22 HISTORY — DX: Chronic kidney disease, unspecified: N18.9

## 2015-07-22 HISTORY — DX: Pneumonia, unspecified organism: J18.9

## 2015-07-22 LAB — CBC WITH DIFFERENTIAL/PLATELET
Basophils Absolute: 0 10*3/uL (ref 0.0–0.1)
Basophils Relative: 0 %
Eosinophils Absolute: 0.4 10*3/uL (ref 0.0–0.7)
Eosinophils Relative: 3 %
HEMATOCRIT: 39.7 % (ref 36.0–46.0)
HEMOGLOBIN: 12.3 g/dL (ref 12.0–15.0)
LYMPHS ABS: 3.4 10*3/uL (ref 0.7–4.0)
Lymphocytes Relative: 28 %
MCH: 25.7 pg — AB (ref 26.0–34.0)
MCHC: 31 g/dL (ref 30.0–36.0)
MCV: 83.1 fL (ref 78.0–100.0)
MONOS PCT: 7 %
Monocytes Absolute: 0.9 10*3/uL (ref 0.1–1.0)
NEUTROS ABS: 7.6 10*3/uL (ref 1.7–7.7)
NEUTROS PCT: 62 %
Platelets: 349 10*3/uL (ref 150–400)
RBC: 4.78 MIL/uL (ref 3.87–5.11)
RDW: 13.6 % (ref 11.5–15.5)
WBC: 12.2 10*3/uL — ABNORMAL HIGH (ref 4.0–10.5)

## 2015-07-22 LAB — COMPREHENSIVE METABOLIC PANEL
ALK PHOS: 57 U/L (ref 38–126)
ALT: 12 U/L — ABNORMAL LOW (ref 14–54)
ANION GAP: 6 (ref 5–15)
AST: 15 U/L (ref 15–41)
Albumin: 4 g/dL (ref 3.5–5.0)
BILIRUBIN TOTAL: 0.4 mg/dL (ref 0.3–1.2)
BUN: 13 mg/dL (ref 6–20)
CALCIUM: 9.2 mg/dL (ref 8.9–10.3)
CO2: 26 mmol/L (ref 22–32)
Chloride: 108 mmol/L (ref 101–111)
Creatinine, Ser: 1.19 mg/dL — ABNORMAL HIGH (ref 0.44–1.00)
GFR, EST NON AFRICAN AMERICAN: 55 mL/min — AB (ref >60)
GLUCOSE: 103 mg/dL — AB (ref 65–99)
Potassium: 4.1 mmol/L (ref 3.5–5.1)
Sodium: 140 mmol/L (ref 135–145)
Total Protein: 8.1 g/dL (ref 6.5–8.1)

## 2015-07-22 LAB — URINALYSIS, ROUTINE W REFLEX MICROSCOPIC
Bilirubin Urine: NEGATIVE
GLUCOSE, UA: NEGATIVE mg/dL
Hgb urine dipstick: NEGATIVE
KETONES UR: NEGATIVE mg/dL
Nitrite: POSITIVE — AB
PH: 6 (ref 5.0–8.0)
Protein, ur: NEGATIVE mg/dL
Specific Gravity, Urine: 1.024 (ref 1.005–1.030)

## 2015-07-22 LAB — URINE MICROSCOPIC-ADD ON: RBC / HPF: NONE SEEN RBC/hpf (ref 0–5)

## 2015-07-22 LAB — ABO/RH: ABO/RH(D): A POS

## 2015-07-22 NOTE — Pre-Procedure Instructions (Signed)
EKG 08-26-14 epic

## 2015-07-27 NOTE — Anesthesia Preprocedure Evaluation (Signed)
Anesthesia Evaluation  Patient identified by MRN, date of birth, ID band Patient awake    Reviewed: Allergy & Precautions, NPO status , Patient's Chart, lab work & pertinent test results  Airway Mallampati: II       Dental  (+) Teeth Intact   Pulmonary asthma ,    breath sounds clear to auscultation       Cardiovascular hypertension, Pt. on medications  Rhythm:Regular Rate:Normal     Neuro/Psych Anxiety    GI/Hepatic negative GI ROS,   Endo/Other    Renal/GU Renal disease     Musculoskeletal   Abdominal   Peds  Hematology   Anesthesia Other Findings   Reproductive/Obstetrics                             Anesthesia Physical  Anesthesia Plan  ASA: II  Anesthesia Plan: General   Post-op Pain Management:    Induction: Intravenous  Airway Management Planned: Oral ETT  Additional Equipment:   Intra-op Plan:   Post-operative Plan: Extubation in OR  Informed Consent: I have reviewed the patients History and Physical, chart, labs and discussed the procedure including the risks, benefits and alternatives for the proposed anesthesia with the patient or authorized representative who has indicated his/her understanding and acceptance.   Dental advisory given  Plan Discussed with: CRNA  Anesthesia Plan Comments:         Anesthesia Quick Evaluation

## 2015-07-28 ENCOUNTER — Encounter (HOSPITAL_COMMUNITY)
Admission: RE | Disposition: A | Discharge status: Home / Self Care | Payer: Self-pay | Source: Ambulatory Visit | Attending: Gynecologic Oncology

## 2015-07-28 ENCOUNTER — Ambulatory Visit (HOSPITAL_COMMUNITY): Payer: 59 | Admitting: Anesthesiology

## 2015-07-28 ENCOUNTER — Encounter (HOSPITAL_COMMUNITY): Payer: Self-pay | Admitting: Anesthesiology

## 2015-07-28 ENCOUNTER — Ambulatory Visit (HOSPITAL_COMMUNITY)
Admission: RE | Admit: 2015-07-28 | Discharge: 2015-07-28 | Disposition: A | Discharge status: Home / Self Care | Payer: 59 | Source: Ambulatory Visit | Attending: Gynecologic Oncology | Admitting: Gynecologic Oncology

## 2015-07-28 DIAGNOSIS — N804 Endometriosis of rectovaginal septum, unspecified involvement of vagina: Secondary | ICD-10-CM | POA: Diagnosis present

## 2015-07-28 DIAGNOSIS — I10 Essential (primary) hypertension: Secondary | ICD-10-CM | POA: Diagnosis not present

## 2015-07-28 DIAGNOSIS — Z79899 Other long term (current) drug therapy: Secondary | ICD-10-CM | POA: Insufficient documentation

## 2015-07-28 DIAGNOSIS — N809 Endometriosis, unspecified: Secondary | ICD-10-CM

## 2015-07-28 HISTORY — PX: ROBOTIC ASSISTED SALPINGO OOPHERECTOMY: SHX6082

## 2015-07-28 LAB — TYPE AND SCREEN
ABO/RH(D): A POS
ANTIBODY SCREEN: NEGATIVE

## 2015-07-28 SURGERY — SALPINGO-OOPHORECTOMY, ROBOT-ASSISTED
Anesthesia: General | Laterality: Bilateral

## 2015-07-28 MED ORDER — OXYCODONE HCL 5 MG PO TABS: 5.0000 mg | ORAL_TABLET | ORAL | Status: DC | PRN

## 2015-07-28 MED ORDER — FENTANYL CITRATE (PF) 250 MCG/5ML IJ SOLN
INTRAMUSCULAR | Status: AC
  Filled 2015-07-28: qty 5

## 2015-07-28 MED ORDER — ROCURONIUM BROMIDE 100 MG/10ML IV SOLN
INTRAVENOUS | Status: AC
  Filled 2015-07-28: qty 1

## 2015-07-28 MED ORDER — PROMETHAZINE HCL 25 MG/ML IJ SOLN: 6.2500 mg | INTRAMUSCULAR | Status: DC | PRN

## 2015-07-28 MED ORDER — PROPOFOL 10 MG/ML IV BOLUS
INTRAVENOUS | Status: AC
  Filled 2015-07-28: qty 20

## 2015-07-28 MED ORDER — ROCURONIUM BROMIDE 100 MG/10ML IV SOLN
INTRAVENOUS | Status: DC | PRN
  Administered 2015-07-28: 40 mg via INTRAVENOUS
  Administered 2015-07-28 (×2): 10 mg via INTRAVENOUS

## 2015-07-28 MED ORDER — SUGAMMADEX SODIUM 200 MG/2ML IV SOLN
INTRAVENOUS | Status: AC
  Filled 2015-07-28: qty 2

## 2015-07-28 MED ORDER — HYDROMORPHONE HCL 1 MG/ML IJ SOLN
INTRAMUSCULAR | Status: DC | PRN
  Administered 2015-07-28 (×2): 0.5 mg via INTRAVENOUS
  Administered 2015-07-28: 1 mg via INTRAVENOUS

## 2015-07-28 MED ORDER — LIDOCAINE HCL (CARDIAC) 20 MG/ML IV SOLN
INTRAVENOUS | Status: AC
  Filled 2015-07-28: qty 5

## 2015-07-28 MED ORDER — ACETAMINOPHEN 325 MG PO TABS: 650.0000 mg | ORAL_TABLET | ORAL | Status: DC | PRN

## 2015-07-28 MED ORDER — SODIUM CHLORIDE 0.9% FLUSH: 3.0000 mL | Freq: Two times a day (BID) | INTRAVENOUS | Status: DC

## 2015-07-28 MED ORDER — STERILE WATER FOR IRRIGATION IR SOLN
Status: DC | PRN
  Administered 2015-07-28: 1000 mL

## 2015-07-28 MED ORDER — LACTATED RINGERS IR SOLN
Status: DC | PRN
  Administered 2015-07-28: 1000 mL

## 2015-07-28 MED ORDER — ONDANSETRON HCL 4 MG/2ML IJ SOLN
INTRAMUSCULAR | Status: AC
  Filled 2015-07-28: qty 2

## 2015-07-28 MED ORDER — MIDAZOLAM HCL 2 MG/2ML IJ SOLN
INTRAMUSCULAR | Status: AC
  Filled 2015-07-28: qty 2

## 2015-07-28 MED ORDER — NORETHINDRONE ACETATE 5 MG PO TABS: 5.0000 mg | ORAL_TABLET | Freq: Every day | ORAL | Status: DC

## 2015-07-28 MED ORDER — HYDROMORPHONE HCL 2 MG/ML IJ SOLN
INTRAMUSCULAR | Status: AC
  Filled 2015-07-28: qty 1

## 2015-07-28 MED ORDER — SUGAMMADEX SODIUM 200 MG/2ML IV SOLN
INTRAVENOUS | Status: DC | PRN
  Administered 2015-07-28: 190 mg via INTRAVENOUS

## 2015-07-28 MED ORDER — LACTATED RINGERS IV SOLN: INTRAVENOUS | Status: DC

## 2015-07-28 MED ORDER — PROMETHAZINE HCL 25 MG/ML IJ SOLN
12.5000 mg | Freq: Once | INTRAMUSCULAR | Status: AC | PRN
  Administered 2015-07-28: 12.5 mg via INTRAMUSCULAR

## 2015-07-28 MED ORDER — CEFAZOLIN SODIUM-DEXTROSE 2-4 GM/100ML-% IV SOLN
2.0000 g | Freq: Once | INTRAVENOUS | Status: AC
  Administered 2015-07-28: 2 g via INTRAVENOUS

## 2015-07-28 MED ORDER — PROMETHAZINE HCL 25 MG/ML IJ SOLN
INTRAMUSCULAR | Status: AC
  Filled 2015-07-28: qty 1

## 2015-07-28 MED ORDER — SODIUM CHLORIDE 0.9 % IV SOLN: 250.0000 mL | INTRAVENOUS | Status: DC | PRN

## 2015-07-28 MED ORDER — CEFAZOLIN SODIUM-DEXTROSE 2-4 GM/100ML-% IV SOLN
INTRAVENOUS | Status: AC
Start: 2015-07-28 — End: 2015-07-28
  Filled 2015-07-28: qty 100

## 2015-07-28 MED ORDER — ACETAMINOPHEN 650 MG RE SUPP
650.0000 mg | RECTAL | Status: DC | PRN
  Filled 2015-07-28: qty 1

## 2015-07-28 MED ORDER — HYDROMORPHONE HCL 1 MG/ML IJ SOLN: 0.2500 mg | INTRAMUSCULAR | Status: DC | PRN

## 2015-07-28 MED ORDER — FENTANYL CITRATE (PF) 100 MCG/2ML IJ SOLN
INTRAMUSCULAR | Status: DC | PRN
  Administered 2015-07-28: 50 ug via INTRAVENOUS
  Administered 2015-07-28: 100 ug via INTRAVENOUS
  Administered 2015-07-28 (×2): 50 ug via INTRAVENOUS

## 2015-07-28 MED ORDER — MIDAZOLAM HCL 5 MG/5ML IJ SOLN
INTRAMUSCULAR | Status: DC | PRN
  Administered 2015-07-28: 2 mg via INTRAVENOUS

## 2015-07-28 MED ORDER — DEXAMETHASONE SODIUM PHOSPHATE 10 MG/ML IJ SOLN
INTRAMUSCULAR | Status: AC
  Filled 2015-07-28: qty 1

## 2015-07-28 MED ORDER — LACTATED RINGERS IV SOLN
INTRAVENOUS | Status: DC | PRN
  Administered 2015-07-28: 07:00:00 via INTRAVENOUS

## 2015-07-28 MED ORDER — ENOXAPARIN SODIUM 40 MG/0.4ML ~~LOC~~ SOLN
40.0000 mg | SUBCUTANEOUS | Status: AC
  Administered 2015-07-28: 40 mg via SUBCUTANEOUS
  Filled 2015-07-28: qty 0.4

## 2015-07-28 MED ORDER — TRAMADOL HCL 50 MG PO TABS: 50.0000 mg | ORAL_TABLET | Freq: Four times a day (QID) | ORAL | Status: DC | PRN

## 2015-07-28 MED ORDER — MORPHINE SULFATE (PF) 10 MG/ML IV SOLN: 2.0000 mg | INTRAVENOUS | Status: DC | PRN

## 2015-07-28 MED ORDER — FENTANYL CITRATE (PF) 100 MCG/2ML IJ SOLN: 25.0000 ug | INTRAMUSCULAR | Status: DC | PRN

## 2015-07-28 MED ORDER — SODIUM CHLORIDE 0.9% FLUSH: 3.0000 mL | INTRAVENOUS | Status: DC | PRN

## 2015-07-28 MED ORDER — ONDANSETRON HCL 4 MG/2ML IJ SOLN
INTRAMUSCULAR | Status: DC | PRN
  Administered 2015-07-28: 4 mg via INTRAVENOUS

## 2015-07-28 MED ORDER — DEXAMETHASONE SODIUM PHOSPHATE 10 MG/ML IJ SOLN
INTRAMUSCULAR | Status: DC | PRN
  Administered 2015-07-28: 10 mg via INTRAVENOUS

## 2015-07-28 MED ORDER — PROPOFOL 10 MG/ML IV BOLUS
INTRAVENOUS | Status: DC | PRN
  Administered 2015-07-28: 150 mg via INTRAVENOUS

## 2015-07-28 MED ORDER — MEPERIDINE HCL 50 MG/ML IJ SOLN: 6.2500 mg | INTRAMUSCULAR | Status: DC | PRN

## 2015-07-28 SURGICAL SUPPLY — 51 items
BAG SPEC RTRVL LRG 6X4 10 (ENDOMECHANICALS) ×2
BRR ADH 5X3 SEPRAFILM 6 SHT (MISCELLANEOUS) ×1
CATH ROBINSON RED A/P 22FR (CATHETERS) ×1 IMPLANT
CHLORAPREP W/TINT 26ML (MISCELLANEOUS) ×2 IMPLANT
COVER SURGICAL LIGHT HANDLE (MISCELLANEOUS) ×2 IMPLANT
COVER TIP SHEARS 8 DVNC (MISCELLANEOUS) ×1 IMPLANT
COVER TIP SHEARS 8MM DA VINCI (MISCELLANEOUS) ×1
DRAPE ARM DVNC X/XI (DISPOSABLE) ×4 IMPLANT
DRAPE COLUMN DVNC XI (DISPOSABLE) ×1 IMPLANT
DRAPE DA VINCI XI ARM (DISPOSABLE) ×4
DRAPE DA VINCI XI COLUMN (DISPOSABLE) ×1
DRAPE SHEET LG 3/4 BI-LAMINATE (DRAPES) ×4 IMPLANT
DRAPE SURG IRRIG POUCH 19X23 (DRAPES) ×2 IMPLANT
ELECT REM PT RETURN 9FT ADLT (ELECTROSURGICAL) ×2
ELECTRODE REM PT RTRN 9FT ADLT (ELECTROSURGICAL) ×1 IMPLANT
GLOVE BIO SURGEON STRL SZ 6 (GLOVE) ×8 IMPLANT
GLOVE BIO SURGEON STRL SZ 6.5 (GLOVE) ×4 IMPLANT
GOWN STRL REUS W/ TWL LRG LVL3 (GOWN DISPOSABLE) ×3 IMPLANT
GOWN STRL REUS W/TWL LRG LVL3 (GOWN DISPOSABLE) ×6
HOLDER FOLEY CATH W/STRAP (MISCELLANEOUS) ×2 IMPLANT
KIT BASIN OR (CUSTOM PROCEDURE TRAY) ×2 IMPLANT
LIQUID BAND (GAUZE/BANDAGES/DRESSINGS) ×2 IMPLANT
MANIPULATOR UTERINE 4.5 ZUMI (MISCELLANEOUS) IMPLANT
MARKER SKIN DUAL TIP RULER LAB (MISCELLANEOUS) ×2 IMPLANT
OBTURATOR XI 8MM BLADELESS (TROCAR) ×2 IMPLANT
OCCLUDER COLPOPNEUMO (BALLOONS) IMPLANT
PAD POSITIONING PINK XL (MISCELLANEOUS) ×2 IMPLANT
PORT ACCESS TROCAR AIRSEAL 12 (TROCAR) ×1 IMPLANT
PORT ACCESS TROCAR AIRSEAL 5M (TROCAR) ×1
POUCH ENDO CATCH II 15MM (MISCELLANEOUS) IMPLANT
POUCH SPECIMEN RETRIEVAL 10MM (ENDOMECHANICALS) ×2 IMPLANT
SEAL CANN UNIV 5-8 DVNC XI (MISCELLANEOUS) ×4 IMPLANT
SEAL XI 5MM-8MM UNIVERSAL (MISCELLANEOUS) ×4
SEPRAFILM PROCEDURAL PACK 3X5 (MISCELLANEOUS) ×1 IMPLANT
SET TRI-LUMEN FLTR TB AIRSEAL (TUBING) ×1 IMPLANT
SET TUBE IRRIG SUCTION NO TIP (IRRIGATION / IRRIGATOR) ×2 IMPLANT
SHEET LAVH (DRAPES) ×2 IMPLANT
SOLUTION ELECTROLUBE (MISCELLANEOUS) ×2 IMPLANT
SUT MNCRL AB 4-0 PS2 18 (SUTURE) ×4 IMPLANT
SUT VIC AB 0 CT1 27 (SUTURE)
SUT VIC AB 0 CT1 27XBRD ANTBC (SUTURE) IMPLANT
SYR 50ML LL SCALE MARK (SYRINGE) IMPLANT
SYRINGE IRR TOOMEY STRL 70CC (SYRINGE) ×2 IMPLANT
TOWEL OR 17X26 10 PK STRL BLUE (TOWEL DISPOSABLE) ×4 IMPLANT
TOWEL OR NON WOVEN STRL DISP B (DISPOSABLE) ×2 IMPLANT
TRAP SPECIMEN MUCOUS 40CC (MISCELLANEOUS) IMPLANT
TRAY FOLEY W/METER SILVER 14FR (SET/KITS/TRAYS/PACK) ×2 IMPLANT
TRAY LAPAROSCOPIC (CUSTOM PROCEDURE TRAY) ×2 IMPLANT
TROCAR BLADELESS OPT 5 100 (ENDOMECHANICALS) ×2 IMPLANT
UNDERPAD 30X30 INCONTINENT (UNDERPADS AND DIAPERS) ×2 IMPLANT
WATER STERILE IRR 1500ML POUR (IV SOLUTION) ×1 IMPLANT

## 2015-07-28 NOTE — Discharge Instructions (Signed)
Bilateral Salpingo-Oophorectomy Bilateral salpingo-oophorectomy is the surgical removal of both fallopian tubes and both ovaries. The ovaries are small organs that produce eggs in women. The fallopian tubes transport the egg from the ovary to the womb (uterus). Usually, when this surgery is done, the uterus was previously removed. A bilateral salpingo-oophorectomy may be done to treat cancer or to reduce the risk of cancer in women who are at high risk. Removing both fallopian tubes and both ovaries will make you unable to become pregnant (sterile). It will also put you into menopause so that you will no longer have menstrual periods and may have menopausal symptoms such as hot flashes, night sweats, and mood changes. It will not affect your sex drive. LET [facility] CARE PROVIDER KNOW ABOUT:  Any allergies you have.  All medicines you are taking, including vitamins, herbs, eye drops, creams, and over-the-counter medicines.  Previous problems you or members of your family have had with the use of anesthetics.  Any blood disorders you have.  Previous surgeries you have had.  Medical conditions you have. RISKS AND COMPLICATIONS Generally, this is a safe procedure. However, as with any procedure, complications can occur. Possible complications include:  Injury to surrounding organs.  Bleeding.  Infection.  Blood clots in the legs or lungs.  Problems related to anesthesia. BEFORE THE PROCEDURE  Ask your health care provider about changing or stopping your regular medicines. You may need to stop taking certain medicines, such as aspirin or blood thinners, at least 1 week before the surgery.  Do not eat or drink anything for at least 8 hours before the surgery.  If you smoke, do not smoke for at least 2 weeks before the surgery.  Make plans to have someone drive you home after the procedure or after your hospital stay. Also arrange for someone to help you with activities during  recovery. PROCEDURE   You will be given medicine to help you relax before the procedure (sedative). You will then be given medicine to make you sleep through the procedure (general anesthetic). These medicines will be given through an IV access tube that is put into one of your veins.  Once you are asleep, your lower abdomen will be shaved and cleaned. A thin, flexible tube (catheter) will be placed in your bladder.  The surgeon may use a laparoscopic, robotic, or open technique for this surgery:  In the laparoscopic technique, the surgery is done through two small cuts (incisions) in the abdomen. A thin, lighted tube with a tiny camera on the end (laparoscope) is inserted into one of the incisions. The tools needed for the procedure are put through the other incision.  A robotic technique may be chosen to perform complex surgery in a small space. In the robotic technique, small incisions will be made. A camera and surgical instruments are passed through the incisions. Surgical instruments will be controlled with the help of a robotic arm.  In the open technique, the surgery is done through one large incision in the abdomen.  Using any of these techniques, the surgeon removes the fallopian tubes and ovaries. The blood vessels will be clamped and tied.  The surgeon then uses staples or stitches to close the incision or incisions. AFTER THE PROCEDURE  You will be taken to a recovery area where you will be monitored for 1 to 3 hours. Your blood pressure, pulse, and temperature will be checked often. You will remain in the recovery area until you are stable and waking  up.  If the laparoscopic technique was used, you may be allowed to go home after several hours. You may have some shoulder pain after the laparoscopic procedure. This is normal and usually goes away in a day or two.  If the open technique was used, you will be admitted to the hospital for a couple of days.  You will be given pain  medicine as needed.  The IV access tube and catheter will be removed before you are discharged.   This information is not intended to replace advice given to you by your health care provider. Make sure you discuss any questions you have with your health care provider.   Document Released: 01/29/2005 Document Revised: 02/03/2013 Document Reviewed: 07/23/2012 Elsevier Interactive Patient Education 2016 [place] Anesthesia, Adult, Care After Refer to this sheet in the next few weeks. These instructions provide you with information on caring for yourself after your procedure. Your health care provider may also give you more specific instructions. Your treatment has been planned according to current medical practices, but problems sometimes occur. Call your health care provider if you have any problems or questions after your procedure. WHAT TO EXPECT AFTER THE PROCEDURE After the procedure, it is typical to experience:  Sleepiness.  Nausea and vomiting. HOME CARE INSTRUCTIONS  For the first 24 hours after general anesthesia:  Have a responsible person with you.  Do not drive a car. If you are alone, do not take public transportation.  Do not drink alcohol.  Do not take medicine that has not been prescribed by your health care provider.  Do not sign important papers or make important decisions.  You may resume a normal diet and activities as directed by your health care provider.  Change bandages (dressings) as directed.  If you have questions or problems that seem related to general anesthesia, call the hospital and ask for the anesthetist or anesthesiologist on call. SEEK MEDICAL CARE IF:  You have nausea and vomiting that continue the day after anesthesia.  You develop a rash. SEEK IMMEDIATE MEDICAL CARE IF:   You have difficulty breathing.  You have chest pain.  You have any allergic problems.   This information is not intended to replace advice given  to you by your health care provider. Make sure you discuss any questions you have with your health care provider.   Document Released: 05/07/2000 Document Revised: 02/19/2014 Document Reviewed: 05/30/2011 Elsevier Interactive Patient Education [employer].

## 2015-07-28 NOTE — H&P (Signed)
H&P  Assessment/Plan:  [name] is a 45 y.o. year old with stage IV endometriosis and obliteration of the rectovaginal septum/vaginal erosion.  Her MRI shows a 5x2.5x3.6cm implant of endometriosis in the posterior wall of the cervix and the ventral wall of the rectum. The wall of the rectum is involved, and in order to resect this lesion, an enbloc trachelectomy with low anterior resection with coloproctostomy would be required.   The patient is extremely reticent to undergo such a procedure as she is very concerned about the risks and recovery involved with such a procedure. I explained that the endometriosis lesion could continue to grow if left in situ.  An option to aid in its reduction in symptoms might be surgical menopause/castration with BSO. The patient is very interested in this.  She was informed regarding the risks and recovery of such a procedure.  We have scheduled her for a robotic assisted lysis of adhesions and BSO.   We will observe her symptoms postoperatively. I will provide her with add back estrogen replacement to prevent surgical menopause.   If this procedure results in no resolution of her symptoms of persistent spotting, she can reconsider the trachelectomy, low anterior resection procedure.  HPI: Sandra Gray is a very pleasant 45 year old woman who is seen in consultation at the request of Dr. Glo Gray for stage IV endometriosis.  The patient has a history of an exploratory laparotomy and supracervical hysterectomy in July 2016 by Dr. Glo Gray presymptomatic uterine fibroids. Surgical findings included endometriosis within the cul-de-sac obliterating the space and making the rectum at densely adherent to the cervix. Therefore supracervical hysterectomy was performed in the endometriosis lesion was left in situ. The surgery was otherwise uncomplicated. However she began developing persistent vaginal spotting and cyclic cramping pelvic pain. Evaluation  of the vagina confirmed posterior cervical and cul-de-sac endometriosis on biopsy performed on 10/06/2014.  The patient was placed on Lupron therapy to suppress ovarian function with the hope to cause regression of the endometriosis lesion. This in fact resulted in no further vaginal spotting and no pelvic pain however this concern about continued long-term Lupron use in a woman remote from menopause (age 55) .  Her last Lupron dose was in April 2017.   She is otherwise fairly healthy. Her past surgical history is significant for multiple hysteroscopic myomectomies and an open supracervical hysterectomy in 2016. She has had a cesarean section 20 years ago. She has "light kidney failure" secondary to hypertension (hypertensive nephropathy).  Dr Sandra Gray is her PCP.  Interval Hx:  On 06/26/15 she underwent MRI of the pelvis. It identified a 5x2.5x3.6cm endometriosis lesion between the posterior wall of the cervix and the anterior rectal wall.   Current Meds:  Outpatient Encounter Prescriptions as of 06/27/2015  Medication Sig  . albuterol (PROVENTIL HFA;VENTOLIN HFA) 108 (90 Base) MCG/ACT inhaler Inhale 2 puffs into the lungs every 6 (six) hours as needed for wheezing.  Marland Kitchen amLODipine (NORVASC) 5 MG tablet Take 1 tablet by mouth daily  . leuprolide (LUPRON DEPOT) 11.25 MG injection Inject 11.25 mg into the muscle every 3 (three) months.  Marland Kitchen lisinopril (PRINIVIL,ZESTRIL) 5 MG tablet Take 1 tablet by mouth daily  . norethindrone (AYGESTIN) 5 MG tablet Take 2.5 mg by mouth daily.   No facility-administered encounter medications on file as of 06/27/2015.    Allergy:  Allergies  Allergen Reactions  . Gadolinium Derivatives Nausea Only    Dry heaves.     Social Hx:  Social History  Social History  . Marital Status: Legally Separated    Spouse Name: N/A  . Number of Children: N/A  . Years of Education: N/A   Occupational  History  . Not on file.   Social History Main Topics  . Smoking status: Never Smoker   . Smokeless tobacco: Never Used  . Alcohol Use: No     Comment: Rarely  . Drug Use: No  . Sexual Activity: Not Currently    Birth Control/ Protection: Condom   Other Topics Concern  . Not on file   Social History Narrative    Past Surgical Hx:  Past Surgical History  Procedure Laterality Date  . Dilation and curettage of uterus  08/2011  . Hysteroscopy  02/15/2012    Fibroid Removal  . Hysteroscopy  may 2014  . Supracervical abdominal hysterectomy N/A 08/31/2014    Procedure: HYSTERECTOMY SUPRACERVICAL ABDOMINAL; Surgeon: Sandra Kind, MD; Location: [facility]; Service: Gynecology; Laterality: N/A; Dr. Elonda Gray will assist    Past Medical Hx:  Past Medical History  Diagnosis Date  . Hypertension 2013    Started on lisinopril  . Asthma 2008  . Bronchitis   . Fibroids     Past Gynecological History: C/s x 1. Fibroids, endometriosis Patient's last menstrual period was 08/02/2014.  Family Hx:  Family History  Problem Relation Age of Onset  . Hypertension Mother   . Hypertension Father   . Diabetes Father   . Kidney disease Maternal Grandmother   . Cancer Maternal Grandfather     Lung - Asbestos exposure  . Diabetes Paternal Grandmother   . Heart disease Paternal Grandfather     Review of Systems:  Constitutional  Feels well,  ENT Normal appearing ears and nares bilaterally Skin/Breast  No rash, sores, jaundice, itching, dryness Cardiovascular  No chest pain, shortness of breath, or edema  Pulmonary  No cough or wheeze.  Gastro Intestinal  No nausea, vomitting, or diarrhoea. No bright red blood per rectum, no abdominal pain, change in bowel movement, or constipation.  Genito Urinary  No frequency, urgency, dysuria, + vaginal  spotting Musculo Skeletal  No myalgia, arthralgia, joint swelling or pain  Neurologic  No weakness, numbness, change in gait,  Psychology  No depression, anxiety, insomnia.   Vitals: Blood pressure 127/79, pulse 99, temperature 98.5 F (36.9 C), temperature source Oral, resp. rate 20, height 5\' 2"  (1.575 m), weight 197 lb 3.2 oz (89.449 kg), last menstrual period 08/02/2014, SpO2 100 %.  Physical Exam: WD in NAD Neck  Supple NROM, without any enlargements.  Lymph Node Survey No cervical supraclavicular or inguinal adenopathy Cardiovascular  Pulse normal rate, regularity and rhythm. S1 and S2 normal.  Lungs  Clear to auscultation bilateraly, without wheezes/crackles/rhonchi. Good air movement.  Skin  No rash/lesions/breakdown  Psychiatry  Alert and oriented to person, place, and time  Abdomen  Normoactive bowel sounds, abdomen soft, non-tender and obese without evidence of hernia.  Back No CVA tenderness Genito Urinary  Vulva/vagina: deferred Rectal  deferred Extremities  No bilateral cyanosis, clubbing or edema.   Donaciano Eva, MD

## 2015-07-28 NOTE — Op Note (Signed)
OPERATIVE NOTE  Date: 07/28/15  Preoperative Diagnosis: rectovaginal endometriosis requires surgical castration   Postoperative Diagnosis:  same  Procedure(s) Performed: Robotic-assisted laparoscopic bilateral salpingo-oophorectomy, lysis of adhesions  Surgeon: Everitt Amber, M.D.  Assistant Surgeon: Lahoma Crocker M.D. (an MD assistant was necessary for tissue manipulation, management of robotic instrumentation, retraction and positioning due to the complexity of the case and hospital policies).   Anesthesia: Gen. endotracheal.  Specimens: Bilateral ovaries, fallopian tubes  Estimated Blood Loss: 25 mL. Blood Replacement: None  Complications: none  Indication for Procedure:  The patient has a 5cm endometriosis implant in the rectovaginal septum that is minimally symptomatic. She does not desire radical surgery with rectal resection at this time but does not desire long-term lupron use. Surgical castration with add back norethindrone was chosen as a treatment choice to attempt to control her symptoms but avoid a radical surgery.  Operative Findings: Dense adhesions between sigmoid colon and residual lower uterine segment stump/cervix and ovaries. Ovaries grossly normal but densely adherent to ovarian fossae and side wall. Normal upper abdomen.  The posterior cervix was able to be separated from the rectum easily however, the endometriosis implant between the vagina and rectum was distal to this.  Procedure: The patient's taken to the operating room and placed under general endotracheal anesthesia testing difficulty. She is placed in a dorsolithotomy position and cervical acromial pad was placed. The arms were tucked with care taken to pad the olecranon process. And prepped and draped in usual sterile fashion. A foley was placed in a sterile manner.  A 90mm incision was made in the left upper quadrant palmer's point and a 5 mm Optiview trocar used to enter the abdomen under direct  visualization. With entry into the abdomen and then maintenance of 15 mm of mercury the patient was placed in Trendelenburg position. An incision was made in the umbilicus and a [id number] trochar was placed through this site. Two incisions were made lateral to the umbilical incision in the left and right abdomen measuring 34mm. These incisions were made approximately 10 cm lateral to the umbilical incision. 8 mm robotic trochars were inserted. The robot was docked.  The abdomen was inspected as was the pelvis. Sharp adhesiolysis separated the omentum from the anterior abdominal wall. For 45 minutes sharp dissection and adhesiolysis separated the sigmoid colon from its reflections on the lower uterine segment/cervical remnant and from the ovaries. The ovaries were then sharply dissected free from the ovarian foss and their attachments to the cervix.   An incision was made on the right pelvic side wall peritoneum parallel to the IP ligament and the retroperitoneal space entered. The right ureter was identified and the para-rectal space was developed. A window was created in the right broad ligament above the ureter. The right infundibulopelvic vessels were skeletonized cauterized and transected. The remnant utero-ovarian ligaments similarly were cauterized and transected. Specimen was placed in an Endo Catch bag.  In a similar manner the left peritoneum and the side wall was incised, and the retroperitoneal space entered. The left ureter was identified and the left pararectal space was developed. The utero-ovarian ligament was skeletonized cauterized and transected. The left utero-ovarian ligaments were cauterized and transected in the left adnexa was placed in an Endo Catch bag.  The abdomen was copiously irrigated and drained and all operative sites inspected and hemostasis was assured. A seprafilm slurry was placed in the pelvis to maintain the pelvic anatomy free of adhesions in anticipation for a future pelvic  surgery. At  the completion of the procedure it was apparent that the sigmoid and rectum were free from the cervix, but densely adherent to the posterior wall of the vagina in the distal 10cm of rectum at the level of the peritoneal reflection in the posterior cul de sac.  The robot was undocked. The camera was placed through the left upper abdominal incision. The contents of the left Endo Catch bag were first aspirated and then morcellated to facilitate removal from the abdominal cavity through the umbilical incision. In a similar fashion the contents of the right Endo Catch bag or morcellated to facilitate removal from the abdominal cavity.  The ports were all remove. The fascial closure at the umbilical incision and left upper quadrant port was made with 0 Vicryl.  All incisions were closed with a running subcuticular Monocryl suture. Dermabond was applied. Sponge, lap and needle counts were correct x 3.    The patient had sequential compression devices for VTE prophylaxis.         Disposition: PACU          Condition: stable  Donaciano Eva, MD

## 2015-07-28 NOTE — Transfer of Care (Signed)
Immediate Anesthesia Transfer of Care Note  Patient: Sandra Gray  Procedure(s) Performed: Procedure(s): XI ROBOTIC ASSISTED BILATERAL SALPINGO OOPHORECTOMY AND LYSIS OF ADHESIONS (Bilateral)  Patient Location: PACU  Anesthesia Type:General  Level of Consciousness:  sedated, patient cooperative and responds to stimulation  Airway & Oxygen Therapy:Patient Spontanous Breathing and Patient connected to face mask oxgen  Post-op Assessment:  Report given to PACU RN and Post -op Vital signs reviewed and stable  Post vital signs:  Reviewed and stable  Last Vitals:  Filed Vitals:   07/28/15 0549  BP: 116/77  Pulse: 77  Temp: 37 C  Resp: 16    Complications: No apparent anesthesia complications

## 2015-07-28 NOTE — Anesthesia Postprocedure Evaluation (Signed)
Anesthesia Post Note  Patient: Sandra Gray  Procedure(s) Performed: Procedure(s) (LRB): XI ROBOTIC ASSISTED BILATERAL SALPINGO OOPHORECTOMY AND LYSIS OF ADHESIONS (Bilateral)  Patient location during evaluation: PACU Anesthesia Type: General Level of consciousness: sedated and patient cooperative Pain management: pain level controlled Vital Signs Assessment: post-procedure vital signs reviewed and stable Respiratory status: spontaneous breathing Cardiovascular status: stable Anesthetic complications: no    Last Vitals:  Filed Vitals:   07/28/15 1045 07/28/15 1143  BP: 131/77 132/80  Pulse: 74 79  Temp: 36.3 C 36.7 C  Resp: 20 18    Last Pain:  Filed Vitals:   07/28/15 1144  PainSc: Asleep                 Nolon Nations

## 2015-07-28 NOTE — Anesthesia Procedure Notes (Signed)
Procedure Name: Intubation Date/Time: 07/28/2015 7:41 AM Performed by: Anne Fu Pre-anesthesia Checklist: Patient identified, Emergency Drugs available, Suction available, Patient being monitored and Timeout performed Patient Re-evaluated:Patient Re-evaluated prior to inductionOxygen Delivery Method: Circle system utilized Preoxygenation: Pre-oxygenation with 100% oxygen Intubation Type: IV induction Ventilation: Mask ventilation without difficulty Laryngoscope Size: Mac and 4 Tube type: Oral Tube size: 7.5 mm Number of attempts: 1 Airway Equipment and Method: Stylet Placement Confirmation: ETT inserted through vocal cords under direct vision,  positive ETCO2,  CO2 detector and breath sounds checked- equal and bilateral Secured at: 21 cm Tube secured with: Tape Dental Injury: Teeth and Oropharynx as per pre-operative assessment

## 2015-08-22 ENCOUNTER — Encounter: Payer: Self-pay | Admitting: Gynecologic Oncology

## 2015-08-22 ENCOUNTER — Ambulatory Visit: Payer: 59 | Attending: Gynecologic Oncology | Admitting: Gynecologic Oncology

## 2015-08-22 VITALS — BP 117/69 | HR 109 | Temp 98.6°F | Resp 18 | Ht 62.0 in | Wt 205.4 lb

## 2015-08-22 DIAGNOSIS — Z841 Family history of disorders of kidney and ureter: Secondary | ICD-10-CM | POA: Insufficient documentation

## 2015-08-22 DIAGNOSIS — N804 Endometriosis of rectovaginal septum, unspecified involvement of vagina: Secondary | ICD-10-CM

## 2015-08-22 DIAGNOSIS — N189 Chronic kidney disease, unspecified: Secondary | ICD-10-CM | POA: Diagnosis not present

## 2015-08-22 DIAGNOSIS — J45909 Unspecified asthma, uncomplicated: Secondary | ICD-10-CM | POA: Insufficient documentation

## 2015-08-22 DIAGNOSIS — I129 Hypertensive chronic kidney disease with stage 1 through stage 4 chronic kidney disease, or unspecified chronic kidney disease: Secondary | ICD-10-CM | POA: Diagnosis not present

## 2015-08-22 DIAGNOSIS — Z79899 Other long term (current) drug therapy: Secondary | ICD-10-CM

## 2015-08-22 DIAGNOSIS — Z833 Family history of diabetes mellitus: Secondary | ICD-10-CM | POA: Diagnosis not present

## 2015-08-22 DIAGNOSIS — Z90710 Acquired absence of both cervix and uterus: Secondary | ICD-10-CM | POA: Insufficient documentation

## 2015-08-22 DIAGNOSIS — Z801 Family history of malignant neoplasm of trachea, bronchus and lung: Secondary | ICD-10-CM | POA: Diagnosis not present

## 2015-08-22 DIAGNOSIS — Z9889 Other specified postprocedural states: Secondary | ICD-10-CM | POA: Diagnosis not present

## 2015-08-22 DIAGNOSIS — Z91041 Radiographic dye allergy status: Secondary | ICD-10-CM | POA: Insufficient documentation

## 2015-08-22 DIAGNOSIS — D259 Leiomyoma of uterus, unspecified: Secondary | ICD-10-CM | POA: Diagnosis not present

## 2015-08-22 DIAGNOSIS — Z8701 Personal history of pneumonia (recurrent): Secondary | ICD-10-CM | POA: Insufficient documentation

## 2015-08-22 DIAGNOSIS — Z90722 Acquired absence of ovaries, bilateral: Secondary | ICD-10-CM | POA: Diagnosis not present

## 2015-08-22 DIAGNOSIS — Z8249 Family history of ischemic heart disease and other diseases of the circulatory system: Secondary | ICD-10-CM | POA: Diagnosis not present

## 2015-08-22 DIAGNOSIS — F1010 Alcohol abuse, uncomplicated: Secondary | ICD-10-CM | POA: Insufficient documentation

## 2015-08-22 NOTE — Progress Notes (Signed)
Follow-up Note: Gyn-Onc  Consult was requested by Dr. Glo Herring for the evaluation of Sandra Gray 45 y.o. female  CC:  Chief Complaint  Patient presents with  . Endometriosis of rectovaginal septum   Assessment/Plan:  [name]  is a 45 y.o.  year old with stage IV endometriosis and obliteration of the rectovaginal septum/vaginal erosion s/p surgical castration with robotic assisted LOA and BSO on 07/28/15.  She has done well postop.  Recommend continuing Aygestin continuously indefinitely, certainly until age of natural menopause or ongoing beyond that time if RV mass remains present.  Her endometriosis is asymptomatic at present, and she strongly desires to avoid surgery, particularly radical colorectal surgery, and therefore it is reasonable to continue expectant management, aygestin, and intervention with surgery only if symptoms dictate. I believe, based on operative findings, that a surgery could be accomplished robotically with posterior vaginectomy and disc resection of anterior rectal wall with primary closure. This might avoid a circumferential reanastamosis and laparotomy. I will see her back in the fall to reevaluate symptoms.  HPI: Sandra Gray is a very pleasant 45 year old woman who is seen in consultation at the request of Dr. Glo Herring for stage IV endometriosis.  The patient has a history of an exploratory laparotomy and supracervical hysterectomy in July 2016 by Dr. Glo Herring presymptomatic uterine fibroids. Surgical findings included endometriosis within the cul-de-sac obliterating the space and making the rectum at densely adherent to the cervix. Therefore supracervical hysterectomy was performed in the endometriosis lesion was left in situ. The surgery was otherwise uncomplicated. However she began developing persistent vaginal spotting and cyclic cramping pelvic pain. Evaluation of the vagina confirmed posterior cervical and cul-de-sac endometriosis on biopsy  performed on 10/06/2014.  The patient was placed on Lupron therapy to suppress ovarian function with the hope to cause regression of the endometriosis lesion. This in fact resulted in no further vaginal spotting and no pelvic pain however this concern about continued long-term Lupron use in a woman remote from menopause (age 52) .  Her last Lupron dose was in April 2017.   She is otherwise fairly healthy. Her past surgical history is significant for multiple hysteroscopic myomectomies and an open supracervical hysterectomy in 2016. She has had a cesarean section 20 years ago. She has "light kidney failure" secondary to hypertension (hypertensive nephropathy).  On 06/26/15 she underwent MRI of the pelvis. It identified a 5x2.5x3.6cm endometriosis lesion between the posterior wall of the cervix and the anterior rectal wall.   Dr Ronette Deter is her PCP.  Interval Hx:  On 07/28/15 she underwent a robotic assisted BSO and lysis of adhesions with a goal of surgical castration to decrease ovarian stimulation of the endometriosis. The patient had hoped to avoid a radical resection and rectal resection.  Surgery was uncomplicated. Intraoperative findings were significant for dense sigmoid adhesions to the posterior cervix which were able to be taken down completely. The rectovaginal lesion was distal to the cervix and did not involve this tissue. It was within 10cm of the anal verge. It was left alone at this surgery, however both ovaries were removed (benign pathology). Seprafilm was used intraop to decrease adhesion formation.  Postop the patient has done very well. She has some abdominal bloating but no pain. She has no bleeding at all since surgery. She continues to strongly not desire rectal resection. Her bowels are moving regularly and freely.  Current Meds:  Outpatient Encounter Prescriptions as of 08/22/2015  Medication Sig  . albuterol (PROVENTIL HFA;VENTOLIN HFA) 108 (  90 Base) MCG/ACT  inhaler Inhale 2 puffs into the lungs every 6 (six) hours as needed for wheezing.  Marland Kitchen amLODipine (NORVASC) 5 MG tablet Take 1 tablet by mouth  daily  . leuprolide (LUPRON DEPOT) 11.25 MG injection Inject 11.25 mg into the muscle every 3 (three) months.  Marland Kitchen lisinopril (PRINIVIL,ZESTRIL) 5 MG tablet Take 1 tablet by mouth  daily  . norethindrone (AYGESTIN) 5 MG tablet Take 1 tablet (5 mg total) by mouth daily.  Marland Kitchen OVER THE COUNTER MEDICATION Take 2 tablets by mouth every 8 (eight) hours as needed (For pain.). Tylenol 8 Hour Muscle Aches & Pains Caplet  . OVER THE COUNTER MEDICATION Take 1,000 mg by mouth daily as needed (For weight loss.). Garcinia Cambogia 1000mg   . Phenazopyridine HCl (AZO TABS PO) Take 1 tablet by mouth 3 (three) times daily as needed (For urinary pain relief.).  Marland Kitchen traMADol (ULTRAM) 50 MG tablet Take 1 tablet (50 mg total) by mouth every 6 (six) hours as needed.  . [DISCONTINUED] norethindrone (AYGESTIN) 5 MG tablet Take 2.5 mg by mouth daily. Reported on 08/22/2015   No facility-administered encounter medications on file as of 08/22/2015.    Allergy:  Allergies  Allergen Reactions  . Gadolinium Derivatives Nausea Only and Other (See Comments)    Dry heaves    Social Hx:   Social History   Social History  . Marital Status: Legally Separated    Spouse Name: N/A  . Number of Children: N/A  . Years of Education: N/A   Occupational History  . Not on file.   Social History Main Topics  . Smoking status: Never Smoker   . Smokeless tobacco: Never Used  . Alcohol Use: 0.6 oz/week    1 Standard drinks or equivalent per week     Comment: Rarely  . Drug Use: No  . Sexual Activity: Not Currently    Birth Control/ Protection: Condom   Other Topics Concern  . Not on file   Social History Narrative    Past Surgical Hx:  Past Surgical History  Procedure Laterality Date  . Dilation and curettage of uterus  08/2011  . Hysteroscopy  02/15/2012    Fibroid Removal  .  Hysteroscopy  may 2014  . Supracervical abdominal hysterectomy N/A 08/31/2014    Procedure: HYSTERECTOMY SUPRACERVICAL ABDOMINAL;  Surgeon: [name], MD;  Location: [facility];  Service: Gynecology;  Laterality: N/A;  Dr. Elonda Husky will assist  . Robotic assisted salpingo oopherectomy Bilateral 07/28/2015    Procedure: XI ROBOTIC ASSISTED BILATERAL SALPINGO OOPHORECTOMY AND LYSIS OF ADHESIONS;  Surgeon: Everitt Amber, MD;  Location: [facility];  Service: Gynecology;  Laterality: Bilateral;    Past Medical Hx:  Past Medical History  Diagnosis Date  . Hypertension 2013    Started on lisinopril  . Asthma 2008  . Bronchitis   . Fibroids   . Pneumonia   . Chronic kidney disease     "declining kidney, but it's stable right now"    Past Gynecological History:  C/s x 1. Fibroids, endometriosis  Patient's last menstrual period was 08/02/2014.  Family Hx:  Family History  Problem Relation Age of Onset  . Hypertension Mother   . Hypertension Father   . Diabetes Father   . Kidney disease Maternal Grandmother   . Cancer Maternal Grandfather     Lung - Asbestos exposure  . Diabetes Paternal Grandmother   . Heart disease Paternal Grandfather     Review of Systems:  Constitutional  Feels well,  ENT Normal appearing ears and nares bilaterally Skin/Breast  No rash, sores, jaundice, itching, dryness Cardiovascular  No chest pain, shortness of breath, or edema  Pulmonary  No cough or wheeze.  Gastro Intestinal  No nausea, vomitting, or diarrhoea. No bright red blood per rectum, no abdominal pain, change in bowel movement, or constipation.  Genito Urinary  No frequency, urgency, dysuria, + vaginal spotting Musculo Skeletal  No myalgia, arthralgia, joint swelling or pain  Neurologic  No weakness, numbness, change in gait,  Psychology  No depression, anxiety, insomnia.   Vitals:  Blood pressure 117/69, pulse 109, temperature 98.6 F (37 C), temperature source Oral, resp. rate 18, height 5'  2" (1.575 m), weight 205 lb 6.4 oz (93.169 kg), last menstrual period 08/02/2014, SpO2 100 %.  Physical Exam: WD in NAD Neck  Supple NROM, without any enlargements.  Lymph Node Survey No cervical supraclavicular or inguinal adenopathy Cardiovascular  Pulse normal rate, regularity and rhythm. S1 and S2 normal.  Lungs  Clear to auscultation bilateraly, without wheezes/crackles/rhonchi. Good air movement.  Skin  No rash/lesions/breakdown  Psychiatry  Alert and oriented to person, place, and time  Abdomen  Normoactive bowel sounds, abdomen soft, non-tender and obese without evidence of hernia.  Back No CVA tenderness Genito Urinary  Dense 5cm mass at rectovaginal septum, minimally mobile, minimally projects into posterior vagina (less so than preop). No blood on glove.  Rectal  deferred Extremities  No bilateral cyanosis, clubbing or edema.   Donaciano Eva, MD  08/22/2015, 3:27 PM   CC: Dr Glo Herring

## 2015-08-22 NOTE — Patient Instructions (Signed)
Plan to follow up in October or sooner if needed.  Please call for any questions or concerns or new symptoms.

## 2015-08-23 ENCOUNTER — Other Ambulatory Visit: Payer: Self-pay | Admitting: Internal Medicine

## 2015-08-26 ENCOUNTER — Ambulatory Visit (INDEPENDENT_AMBULATORY_CARE_PROVIDER_SITE_OTHER): Payer: 59 | Admitting: Internal Medicine

## 2015-08-26 ENCOUNTER — Encounter: Payer: Self-pay | Admitting: Internal Medicine

## 2015-08-26 VITALS — BP 144/76 | HR 108 | Ht 62.0 in | Wt 205.6 lb

## 2015-08-26 DIAGNOSIS — I10 Essential (primary) hypertension: Secondary | ICD-10-CM

## 2015-08-26 DIAGNOSIS — E669 Obesity, unspecified: Secondary | ICD-10-CM

## 2015-08-26 DIAGNOSIS — N804 Endometriosis of rectovaginal septum, unspecified involvement of vagina: Secondary | ICD-10-CM

## 2015-08-26 MED ORDER — NORETHINDRONE ACETATE 5 MG PO TABS: 5.0000 mg | ORAL_TABLET | Freq: Every day | ORAL | Status: DC

## 2015-08-26 MED ORDER — AMLODIPINE BESYLATE 5 MG PO TABS: ORAL_TABLET | ORAL | Status: DC

## 2015-08-26 MED ORDER — LISINOPRIL 5 MG PO TABS: ORAL_TABLET | ORAL | Status: DC

## 2015-08-26 NOTE — Assessment & Plan Note (Signed)
Severe endometriosis s/p hysterectomy and oophorectomy. Reviewed notes from GYN.  Now on Norethindrone. We discussed potential benefits and risks of this medication. She is interested in a second opinion.  Will set up evaluation at [facility].

## 2015-08-26 NOTE — Progress Notes (Signed)
Subjective:    Patient ID: Sandra Gray, female    DOB: 1970-12-02, 44 y.o.   MRN: [id number]  HPI  45YO female presents for follow up.  Recently had hysterectomy and oophorectomy. Has severe endometriosis and reports rectal involvement. Plan per GYN was to stay on norethindrone for life. Unsure if she wants to do this or pursue another option. Would like another opinion.  Frustrated by weight. Has not been able to exercise except for walking, after recent surgery. Interested in trying an appetite suppressant.    Wt Readings from Last 3 Encounters:  08/26/15 205 lb 9.6 oz (93.26 kg)  08/22/15 205 lb 6.4 oz (93.169 kg)  07/28/15 204 lb 9 oz (92.789 kg)   BP Readings from Last 3 Encounters:  08/26/15 144/76  08/22/15 117/69  07/28/15 132/80    Past Medical History  Diagnosis Date  . Hypertension 2013    Started on lisinopril  . Asthma 2008  . Bronchitis   . Fibroids   . Pneumonia   . Chronic kidney disease     "declining kidney, but it's stable right now"   Family History  Problem Relation Age of Onset  . Hypertension Mother   . Hypertension Father   . Diabetes Father   . Kidney disease Maternal Grandmother   . Cancer Maternal Grandfather     Lung - Asbestos exposure  . Diabetes Paternal Grandmother   . Heart disease Paternal Grandfather    Past Surgical History  Procedure Laterality Date  . Dilation and curettage of uterus  08/2011  . Hysteroscopy  02/15/2012    Fibroid Removal  . Hysteroscopy  may 2014  . Supracervical abdominal hysterectomy N/A 08/31/2014    Procedure: HYSTERECTOMY SUPRACERVICAL ABDOMINAL;  Surgeon: [name], MD;  Location: [facility];  Service: Gynecology;  Laterality: N/A;  Dr. Elonda Husky will assist  . Robotic assisted salpingo oopherectomy Bilateral 07/28/2015    Procedure: XI ROBOTIC ASSISTED BILATERAL SALPINGO OOPHORECTOMY AND LYSIS OF ADHESIONS;  Surgeon: Everitt Amber, MD;  Location: [facility];  Service: Gynecology;  Laterality:  Bilateral;   Social History   Social History  . Marital Status: Legally Separated    Spouse Name: N/A  . Number of Children: N/A  . Years of Education: N/A   Social History Main Topics  . Smoking status: Never Smoker   . Smokeless tobacco: Never Used  . Alcohol Use: 0.6 oz/week    1 Standard drinks or equivalent per week     Comment: Rarely  . Drug Use: No  . Sexual Activity: Not Currently    Birth Control/ Protection: Condom   Other Topics Concern  . None   Social History Narrative    Review of Systems  Constitutional: Negative for fever, chills, appetite change, fatigue and unexpected weight change.  Eyes: Negative for visual disturbance.  Respiratory: Negative for shortness of breath.   Cardiovascular: Negative for chest pain, palpitations and leg swelling.  Gastrointestinal: Negative for nausea, vomiting, abdominal pain, diarrhea and constipation.  Skin: Negative for color change and rash.  Hematological: Negative for adenopathy. Does not bruise/bleed easily.  Psychiatric/Behavioral: Negative for dysphoric mood. The patient is not nervous/anxious.        Objective:    BP 144/76 mmHg  Pulse 108  Ht 5\' 2"  (1.575 m)  Wt 205 lb 9.6 oz (93.26 kg)  BMI 37.60 kg/m2  SpO2 98%  LMP 08/02/2014 Physical Exam  Constitutional: She is oriented to person, place, and time. She appears well-developed and well-nourished.  No distress.  HENT:  Head: Normocephalic and atraumatic.  Right Ear: External ear normal.  Left Ear: External ear normal.  Nose: Nose normal.  Mouth/Throat: Oropharynx is clear and moist. No oropharyngeal exudate.  Eyes: Conjunctivae and EOM are normal. Pupils are equal, round, and reactive to light. Right eye exhibits no discharge.  Neck: Normal range of motion. Neck supple. No thyromegaly present.  Cardiovascular: Normal rate, regular rhythm, normal heart sounds and intact distal pulses.  Exam reveals no gallop and no friction rub.   No murmur  heard. Pulmonary/Chest: Effort normal. No respiratory distress. She has no wheezes. She has no rales.  Abdominal: Soft. Bowel sounds are normal. She exhibits no distension and no mass. There is no tenderness. There is no rebound and no guarding.    Musculoskeletal: Normal range of motion. She exhibits no edema or tenderness.  Lymphadenopathy:    She has no cervical adenopathy.  Neurological: She is alert and oriented to person, place, and time. No cranial nerve deficit. Coordination normal.  Skin: Skin is warm and dry. No rash noted. She is not diaphoretic. No erythema. No pallor.  Psychiatric: She has a normal mood and affect. Her behavior is normal. Judgment and thought content normal.          Assessment & Plan:   Problem List Items Addressed This Visit      Unprioritized   Endometriosis of rectovaginal septum - Primary (Chronic)    Severe endometriosis s/p hysterectomy and oophorectomy. Reviewed notes from GYN.  Now on Norethindrone. We discussed potential benefits and risks of this medication. She is interested in a second opinion.  Will set up evaluation at [facility].      Relevant Orders   Ambulatory referral to Gynecology   Hypertension (Chronic)    BP Readings from Last 3 Encounters:  08/26/15 144/76  08/22/15 117/69  07/28/15 132/80   BP generally has been well controled. Recent renal function stable. Continue Lisinopril and Amlodipine.      Relevant Medications   amLODipine (NORVASC) 5 MG tablet   lisinopril (PRINIVIL,ZESTRIL) 5 MG tablet   Obesity (BMI 30-39.9)    Wt Readings from Last 3 Encounters:  08/26/15 205 lb 9.6 oz (93.26 kg)  08/22/15 205 lb 6.4 oz (93.169 kg)  07/28/15 204 lb 9 oz (92.789 kg)   Discussed some medications to help with appetite. She will look into coverage for Saxenda. Encouraged healthy diet and exercise.          Return in about 4 weeks (around 09/23/2015) for New Patient.  Ronette Deter, MD Internal Medicine [facility]

## 2015-08-26 NOTE — Assessment & Plan Note (Signed)
Wt Readings from Last 3 Encounters:  08/26/15 205 lb 9.6 oz (93.26 kg)  08/22/15 205 lb 6.4 oz (93.169 kg)  07/28/15 204 lb 9 oz (92.789 kg)   Discussed some medications to help with appetite. She will look into coverage for Saxenda. Encouraged healthy diet and exercise.

## 2015-08-26 NOTE — Assessment & Plan Note (Signed)
BP Readings from Last 3 Encounters:  08/26/15 144/76  08/22/15 117/69  07/28/15 132/80   BP generally has been well controled. Recent renal function stable. Continue Lisinopril and Amlodipine.

## 2015-08-26 NOTE — Patient Instructions (Addendum)
Look into coverage for Saxenda at [email or web address]  We will set up evaluation with [facility].

## 2015-08-26 NOTE — Progress Notes (Signed)
Pre visit review using our clinic review tool, if applicable. No additional management support is needed unless otherwise documented below in the visit note. 

## 2015-09-02 ENCOUNTER — Encounter: Payer: Self-pay | Admitting: Internal Medicine

## 2015-09-02 NOTE — Telephone Encounter (Signed)
Please advise 

## 2015-09-07 ENCOUNTER — Other Ambulatory Visit: Payer: Self-pay

## 2015-09-07 MED ORDER — LIRAGLUTIDE -WEIGHT MANAGEMENT 18 MG/3ML ~~LOC~~ SOPN: 0.6000 mg | PEN_INJECTOR | Freq: Every day | SUBCUTANEOUS | 3 refills | Status: DC

## 2015-09-07 NOTE — Telephone Encounter (Signed)
Medication has been refilled per Dr. Gilford Rile.

## 2015-09-08 ENCOUNTER — Encounter: Payer: Self-pay | Admitting: Gynecologic Oncology

## 2015-09-30 ENCOUNTER — Encounter: Payer: Self-pay | Admitting: Obstetrics and Gynecology

## 2015-11-04 ENCOUNTER — Encounter: Payer: Self-pay | Admitting: Family Medicine

## 2015-11-04 ENCOUNTER — Ambulatory Visit (INDEPENDENT_AMBULATORY_CARE_PROVIDER_SITE_OTHER): Payer: 59 | Admitting: Family Medicine

## 2015-11-04 VITALS — BP 112/68 | HR 98 | Temp 98.4°F | Ht 62.0 in | Wt 211.4 lb

## 2015-11-04 DIAGNOSIS — N804 Endometriosis of rectovaginal septum, unspecified involvement of vagina: Secondary | ICD-10-CM

## 2015-11-04 DIAGNOSIS — Z23 Encounter for immunization: Secondary | ICD-10-CM

## 2015-11-04 DIAGNOSIS — F419 Anxiety disorder, unspecified: Secondary | ICD-10-CM | POA: Diagnosis not present

## 2015-11-04 DIAGNOSIS — I10 Essential (primary) hypertension: Secondary | ICD-10-CM | POA: Diagnosis not present

## 2015-11-04 DIAGNOSIS — E669 Obesity, unspecified: Secondary | ICD-10-CM

## 2015-11-04 NOTE — Patient Instructions (Signed)
Nice to meet you. Please continue to exercise. Please cut down on prepackaged fruit intake and soda intake. She can replace this with fresh fruit though vegetables would be better.

## 2015-11-04 NOTE — Assessment & Plan Note (Signed)
Advised on diet and exercise. Discussed dietary changes with decreasing fruit cups and sodas. Encouraged continued exercise. Then 2 months for this.

## 2015-11-04 NOTE — Progress Notes (Signed)
Pre visit review using our clinic review tool, if applicable. No additional management support is needed unless otherwise documented below in the visit note. 
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
## 2015-11-04 NOTE — Assessment & Plan Note (Signed)
Improved following hysterectomy and BSO. Continue to monitor.

## 2015-11-04 NOTE — Assessment & Plan Note (Signed)
At goal. Continue current medications. 

## 2015-11-04 NOTE — Assessment & Plan Note (Signed)
Offered treatment though patient wants to see how she does after her job changes. She'll continue to monitor.

## 2015-11-07 DIAGNOSIS — Z23 Encounter for immunization: Secondary | ICD-10-CM | POA: Diagnosis not present

## 2015-11-22 ENCOUNTER — Ambulatory Visit: Payer: Self-pay

## 2015-11-29 NOTE — Progress Notes (Deleted)
Follow-up Note: Gyn-Onc  Consult was requested by Dr. Glo Herring for the evaluation of Sandra Gray 45 y.o. female  CC:  No chief complaint on file.  Assessment/Plan:  Sandra Gray  is a 45 y.o.  year old with stage IV endometriosis and obliteration of the rectovaginal septum/vaginal erosion s/p surgical castration with robotic assisted LOA and BSO on 07/28/15.  Recommend continuing Aygestin continuously indefinitely, certainly until age of natural menopause or ongoing beyond that time if RV mass remains present.  Her endometriosis is asymptomatic at present, and she strongly desires to avoid surgery, particularly radical colorectal surgery, and therefore it is reasonable to continue expectant management, aygestin, and intervention with surgery only if symptoms dictate. I believe, based on operative findings, that a surgery could be accomplished robotically with posterior vaginectomy and disc resection of anterior rectal wall with primary closure. This might avoid a circumferential reanastamosis and laparotomy.  I will see her back ***  HPI: Sandra Gray is a very pleasant 45 year old woman who is seen in consultation at the request of Dr. Glo Herring for stage IV endometriosis.  The patient has a history of an exploratory laparotomy and supracervical hysterectomy in July 2016 by Dr. Glo Herring presymptomatic uterine fibroids. Surgical findings included endometriosis within the cul-de-sac obliterating the space and making the rectum at densely adherent to the cervix. Therefore supracervical hysterectomy was performed in the endometriosis lesion was left in situ. The surgery was otherwise uncomplicated. However she began developing persistent vaginal spotting and cyclic cramping pelvic pain. Evaluation of the vagina confirmed posterior cervical and cul-de-sac endometriosis on biopsy performed on 10/06/2014.  The patient was placed on Lupron therapy to suppress ovarian function with the hope  to cause regression of the endometriosis lesion. This in fact resulted in no further vaginal spotting and no pelvic pain however this concern about continued long-term Lupron use in a woman remote from menopause (age 29) .  Her last Lupron dose was in April 2017.   She is otherwise fairly healthy. Her past surgical history is significant for multiple hysteroscopic myomectomies and an open supracervical hysterectomy in 2016. She has had a cesarean section 20 years ago. She has "light kidney failure" secondary to hypertension (hypertensive nephropathy).  On 06/26/15 she underwent MRI of the pelvis. It identified a 5x2.5x3.6cm endometriosis lesion between the posterior wall of the cervix and the anterior rectal wall.   Dr Ronette Deter is her PCP.  On 07/28/15 she underwent a robotic assisted BSO and lysis of adhesions with a goal of surgical castration to decrease ovarian stimulation of the endometriosis. The patient had hoped to avoid a radical resection and rectal resection.  Surgery was uncomplicated. Intraoperative findings were significant for dense sigmoid adhesions to the posterior cervix which were able to be taken down completely. The rectovaginal lesion was distal to the cervix and did not involve this tissue. It was within 10cm of the anal verge. It was left alone at this surgery, however both ovaries were removed (benign pathology). Seprafilm was used intraop to decrease adhesion formation.  Interval Hx:    She has no bleeding at all since surgery. She continues to strongly not desire rectal resection. Her bowels are moving regularly and freely.  Current Meds:  Outpatient Encounter Prescriptions as of 11/30/2015  Medication Sig  . albuterol (PROVENTIL HFA;VENTOLIN HFA) 108 (90 Base) MCG/ACT inhaler Inhale 2 puffs into the lungs every 6 (six) hours as needed for wheezing.  Marland Kitchen amLODipine (NORVASC) 5 MG tablet Take 1 tablet by mouth  daily  . lisinopril (PRINIVIL,ZESTRIL) 5 MG tablet  Take 1 tablet by mouth  daily  . norethindrone (AYGESTIN) 5 MG tablet Take 1 tablet (5 mg total) by mouth daily.   No facility-administered encounter medications on file as of 11/30/2015.     Allergy:  Allergies  Allergen Reactions  . Gadolinium Derivatives Nausea Only and Other (See Comments)    Dry heaves    Social Hx:   Social History   Social History  . Marital status: Legally Separated    Spouse name: N/A  . Number of children: N/A  . Years of education: N/A   Occupational History  . Not on file.   Social History Main Topics  . Smoking status: Never Smoker  . Smokeless tobacco: Never Used  . Alcohol use 0.6 oz/week    1 Standard drinks or equivalent per week     Comment: Rarely  . Drug use: No  . Sexual activity: Not Currently    Birth control/ protection: Condom   Other Topics Concern  . Not on file   Social History Narrative  . No narrative on file    Past Surgical Hx:  Past Surgical History:  Procedure Laterality Date  . DILATION AND CURETTAGE OF UTERUS  08/2011  . HYSTEROSCOPY  02/15/2012   Fibroid Removal  . HYSTEROSCOPY  may 2014  . ROBOTIC ASSISTED SALPINGO OOPHERECTOMY Bilateral 07/28/2015   Procedure: XI ROBOTIC ASSISTED BILATERAL SALPINGO OOPHORECTOMY AND LYSIS OF ADHESIONS;  Surgeon: Everitt Amber, MD;  Location: [facility];  Service: Gynecology;  Laterality: Bilateral;  . SUPRACERVICAL ABDOMINAL HYSTERECTOMY N/A 08/31/2014   Procedure: HYSTERECTOMY SUPRACERVICAL ABDOMINAL;  Surgeon: [name], MD;  Location: [facility];  Service: Gynecology;  Laterality: N/A;  Dr. Elonda Husky will assist    Past Medical Hx:  Past Medical History:  Diagnosis Date  . Asthma 2008  . Bronchitis   . Chronic kidney disease    "declining kidney, but it's stable right now"  . Fibroids   . Hypertension 2013   Started on lisinopril  . Pneumonia     Past Gynecological History:  C/s x 1. Fibroids, endometriosis  Patient's last menstrual period was 08/02/2014.  Family Hx:   Family History  Problem Relation Age of Onset  . Hypertension Mother   . Hypertension Father   . Diabetes Father   . Kidney disease Maternal Grandmother   . Cancer Maternal Grandfather     Lung - Asbestos exposure  . Diabetes Paternal Grandmother   . Heart disease Paternal Grandfather     Review of Systems:  Constitutional  Feels well,    ENT Normal appearing ears and nares bilaterally Skin/Breast  No rash, sores, jaundice, itching, dryness Cardiovascular  No chest pain, shortness of breath, or edema  Pulmonary  No cough or wheeze.  Gastro Intestinal  No nausea, vomitting, or diarrhoea. No bright red blood per rectum, no abdominal pain, change in bowel movement, or constipation.  Genito Urinary  No frequency, urgency, dysuria, + vaginal spotting Musculo Skeletal  No myalgia, arthralgia, joint swelling or pain  Neurologic  No weakness, numbness, change in gait,  Psychology  No depression, anxiety, insomnia.   Vitals:  Last menstrual period 08/02/2014.  Physical Exam: WD in NAD Neck  Supple NROM, without any enlargements.  Lymph Node Survey No cervical supraclavicular or inguinal adenopathy Cardiovascular  Pulse normal rate, regularity and rhythm. S1 and S2 normal.  Lungs  Clear to auscultation bilateraly, without wheezes/crackles/rhonchi. Good air movement.  Skin  No  rash/lesions/breakdown  Psychiatry  Alert and oriented to person, place, and time  Abdomen  Normoactive bowel sounds, abdomen soft, non-tender and obese without evidence of hernia.  Back No CVA tenderness Genito Urinary  Dense 5cm mass at rectovaginal septum, minimally mobile, minimally projects into posterior vagina (less so than preop). No blood on glove.  Rectal  deferred Extremities  No bilateral cyanosis, clubbing or edema.   Donaciano Eva, MD  11/29/2015, 3:18 PM   CC: Dr Glo Herring

## 2015-11-30 ENCOUNTER — Ambulatory Visit: Payer: Self-pay | Admitting: Gynecologic Oncology

## 2015-12-15 ENCOUNTER — Telehealth: Payer: Self-pay | Admitting: *Deleted

## 2015-12-15 NOTE — Telephone Encounter (Signed)
Please advise on refill.

## 2015-12-15 NOTE — Telephone Encounter (Signed)
Patient has requested to have a Rx refill for her nebulizer - albuterol liquid. Pt has Hx of bronchitis  Pt contact [phone] Pharmacy [facility] mail [facility]

## 2015-12-16 ENCOUNTER — Ambulatory Visit (INDEPENDENT_AMBULATORY_CARE_PROVIDER_SITE_OTHER): Payer: 59

## 2015-12-16 ENCOUNTER — Encounter: Payer: Self-pay | Admitting: Family Medicine

## 2015-12-16 ENCOUNTER — Telehealth: Payer: Self-pay | Admitting: Surgical

## 2015-12-16 ENCOUNTER — Ambulatory Visit (INDEPENDENT_AMBULATORY_CARE_PROVIDER_SITE_OTHER): Payer: 59 | Admitting: Family Medicine

## 2015-12-16 VITALS — BP 128/82 | HR 92 | Temp 99.1°F | Wt 212.0 lb

## 2015-12-16 DIAGNOSIS — R05 Cough: Secondary | ICD-10-CM | POA: Diagnosis not present

## 2015-12-16 DIAGNOSIS — R059 Cough, unspecified: Secondary | ICD-10-CM

## 2015-12-16 DIAGNOSIS — M79644 Pain in right finger(s): Secondary | ICD-10-CM | POA: Diagnosis not present

## 2015-12-16 DIAGNOSIS — J45909 Unspecified asthma, uncomplicated: Secondary | ICD-10-CM

## 2015-12-16 DIAGNOSIS — R0602 Shortness of breath: Secondary | ICD-10-CM | POA: Diagnosis not present

## 2015-12-16 LAB — D-DIMER, QUANTITATIVE (NOT AT ARMC): D DIMER QUANT: 0.35 ug{FEU}/mL (ref <0.50)

## 2015-12-16 MED ORDER — ALBUTEROL SULFATE (2.5 MG/3ML) 0.083% IN NEBU: 2.5000 mg | INHALATION_SOLUTION | Freq: Four times a day (QID) | RESPIRATORY_TRACT | 1 refills | Status: AC | PRN

## 2015-12-16 MED ORDER — PREDNISONE 20 MG PO TABS: 40.0000 mg | ORAL_TABLET | Freq: Every day | ORAL | 0 refills | Status: DC

## 2015-12-16 NOTE — Assessment & Plan Note (Addendum)
See asthma with bronchitis for plan.

## 2015-12-16 NOTE — Progress Notes (Signed)
Pre visit review using our clinic review tool, if applicable. No additional management support is needed unless otherwise documented below in the visit note. 

## 2015-12-16 NOTE — Telephone Encounter (Signed)
Patient came in for appointment.  

## 2015-12-16 NOTE — Telephone Encounter (Signed)
D-dimer negative. Given this and low suspicion for PE as cause this effectively rules out blood clot. Suspect symptoms are related to bronchitis. We'll send in prednisone for her to take. She'll continue albuterol.

## 2015-12-16 NOTE — Patient Instructions (Signed)
Nice to see you. We are going to obtain an x-ray of your hand to evaluate for cause of your pain. We are going to order some lab work to evaluate for a blood clot. If this comes back positive you will need to go to the emergency room for further evaluation. If you develop chest pain, shortness of breath, cough productive of blood, or any new or change in symptoms please seek medical attention immediately.

## 2015-12-16 NOTE — Assessment & Plan Note (Signed)
Pretty benign exam today. No tenderness. No anatomic snuffbox tenderness. Will obtain x-ray of her hand to evaluate further.

## 2015-12-16 NOTE — Telephone Encounter (Signed)
Refill sent to pharmacy. Please see if she is having bronchitis symptoms. If she is she should be evaluated. Thanks.

## 2015-12-16 NOTE — Progress Notes (Signed)
Tommi Rumps, MD Phone: [phone]  Sandra Gray is a 45 y.o. female who presents today for same-day visit.  Right thumb pain: Patient notes this has been going on for a month. She is unsure if she hurt it. Hurts to flex the thumb. Occasionally it will lock up. No pain if she doesn't move it. No achy pain. It initially hurt in the thenar eminence though has now moved to her thumb.  Cough: Patient notes for the last couple weeks she's had cough that has been nonproductive. Some trouble breathing this past Monday. Notes she has seasonal asthma and bronchitis. Most years she gets this. No wheezing. No shortness of breath now. Had some rhinorrhea though no other upper respiratory symptoms. Occasionally feels as though she had a cinder block on her chest on Monday though none since then. No shortness of breath at this time. Her albuterol inhaler is beneficial. She does note that her left leg has been painful and tender in the distal quadriceps. Reports some swelling in the left distal quadriceps, though no calf swelling or pain. She is on a hormone supplement.   PMH: nonsmoker.   ROS see history of present illness  Objective  Physical Exam Vitals:   12/16/15 1305 12/16/15 1338  BP: 128/82   Pulse: (!) 103 92  Temp: 99.1 F (37.3 C)     BP Readings from Last 3 Encounters:  12/16/15 128/82  11/04/15 112/68  08/26/15 (!) 144/76   Wt Readings from Last 3 Encounters:  12/16/15 212 lb (96.2 kg)  11/04/15 211 lb 6.4 oz (95.9 kg)  08/26/15 205 lb 9.6 oz (93.3 kg)    Physical Exam  Constitutional: No distress.  Cardiovascular: Normal rate, regular rhythm and normal heart sounds.   Pulmonary/Chest: Effort normal and breath sounds normal.  Musculoskeletal: She exhibits no edema.  No obvious left lower extremity swelling, no calf tenderness, there is mild tenderness just superior to the knee in the quadriceps muscle, no cords palpated, no right lower extremity swelling, tenderness,  or cords palpated, left calf 44 cm, right calf 43 cm No tenderness of the right anatomic snuffbox, no tenderness of the thenar eminence or joints in the right thumb, no hand tenderness, full passive range of motion right thumb,  Neurological: She is alert. Gait normal.  Skin: Skin is warm and dry. She is not diaphoretic.     Assessment/Plan: Please see individual problem list.  Pain of right thumb Pretty benign exam today. No tenderness. No anatomic snuffbox tenderness. Will obtain x-ray of her hand to evaluate further.  Cough See asthma with bronchitis for plan.  Asthma with bronchitis I suspect patient's respiratory symptoms are likely related to her asthma and bronchitis. Her symptoms are most consistent with bronchitis and asthma exacerbation. She did complain of chest pain that occurred on Monday of this week and an EKG was obtained that was reassuring with no ischemic changes. I suspect this issue was related to reactive airways. Given her complaint of leg pain and mild tachycardia a d-dimer was obtained that was negative as well thus I doubt PE as a cause of her symptoms. I suspect muscular strain as the cause of her quadriceps discomfort. We will treat her with albuterol and prednisone. She will follow-up next week for reevaluation. Given return precautions.   Orders Placed This Encounter  Procedures  . DG Hand Complete Right    Standing Status:   Future    Number of Occurrences:   1    Standing Expiration  Date:   02/14/2017    Order Specific Question:   Reason for Exam (SYMPTOM  OR DIAGNOSIS REQUIRED)    Answer:   right thumb and thenar eminence pain, no injury    Order Specific Question:   Is patient pregnant?    Answer:   No    Order Specific Question:   Preferred imaging location?    Answer:   [facility]  . D-Dimer, Quantitative  . EKG 12-Lead    Tommi Rumps, MD [place]

## 2015-12-16 NOTE — Telephone Encounter (Signed)
Spoke with patient and gave results for the D Dimer. She would like her RX sent to [facility] in [place].

## 2015-12-16 NOTE — Assessment & Plan Note (Signed)
I suspect patient's respiratory symptoms are likely related to her asthma and bronchitis. Her symptoms are most consistent with bronchitis and asthma exacerbation. She did complain of chest pain that occurred on Monday of this week and an EKG was obtained that was reassuring with no ischemic changes. I suspect this issue was related to reactive airways. Given her complaint of leg pain and mild tachycardia a d-dimer was obtained that was negative as well thus I doubt PE as a cause of her symptoms. I suspect muscular strain as the cause of her quadriceps discomfort. We will treat her with albuterol and prednisone. She will follow-up next week for reevaluation. Given return precautions.

## 2015-12-22 ENCOUNTER — Encounter: Payer: Self-pay | Admitting: Family Medicine

## 2015-12-22 ENCOUNTER — Ambulatory Visit (INDEPENDENT_AMBULATORY_CARE_PROVIDER_SITE_OTHER): Payer: 59 | Admitting: Family Medicine

## 2015-12-22 VITALS — BP 144/82 | HR 100 | Temp 98.7°F | Wt 217.7 lb

## 2015-12-22 DIAGNOSIS — M79644 Pain in right finger(s): Secondary | ICD-10-CM

## 2015-12-22 DIAGNOSIS — M25562 Pain in left knee: Secondary | ICD-10-CM | POA: Diagnosis not present

## 2015-12-22 DIAGNOSIS — J45909 Unspecified asthma, uncomplicated: Secondary | ICD-10-CM

## 2015-12-22 DIAGNOSIS — I10 Essential (primary) hypertension: Secondary | ICD-10-CM

## 2015-12-22 MED ORDER — FLUTICASONE PROPIONATE HFA 110 MCG/ACT IN AERO: 1.0000 | INHALATION_SPRAY | Freq: Two times a day (BID) | RESPIRATORY_TRACT | 0 refills | Status: AC

## 2015-12-22 NOTE — Assessment & Plan Note (Signed)
Slightly above goal today. Suspect due to her discomfort. Has been well-controlled recently. She will continue her current medications and we will follow up with her in one month.

## 2015-12-22 NOTE — Assessment & Plan Note (Signed)
Overall benign exam with the exception of her having difficulty flexing her right thumb. X-ray was reassuring. Given this and her left knee discomfort which is concerning for a meniscal issue I advised orthopedic referral though she was adamant that she go to preferred pain management for evaluation. Given this a referral was placed.

## 2015-12-22 NOTE — Assessment & Plan Note (Addendum)
Patient's symptoms have improved with prednisone and albuterol use. She has recurrent symptoms throughout the winter per her report. I discussed option of starting on a controller medicine with low-dose inhaled corticosteroid. She was concerned about cost relating to this if this was prescribed and wanted to know if we had samples. I discussed that the samples we have are not the typical starting dose, though are slightly higher and still considered the low dose ICS. I gave the option of a prescription vs samples and she opted for the sample. She is provided with a sample of Flovent to use twice daily for this. Discussed risk of throat irritation and thrush. I used the package insert to go over use of the inhaler with her. She voiced understanding. Follow-up in one month for recheck.

## 2015-12-22 NOTE — Patient Instructions (Addendum)
Nice to see you. We are going to start you on Flovent to help with your asthma. You can continue to use albuterol as needed. We will refer you to the pain clinic. If you develop trouble breathing, recurrent cough, fevers, chest pain, or any new or changing symptoms please seek medical attention.  Flovent HFA: Shake container thoroughly for 5 seconds before each inhalation. Use inhaler on inspiration. Allow 30 seconds between inhalations. Rinse mouth with water (without swallowing) after each use. Inhaler must be primed before first use, when not used for 7 days, or if dropped. To prime the first time, release 4 sprays into air; shake well for 5 seconds before each spray and spray away from face. The counter should now read "120". If dropped or not used for 7 days, prime by shaking well for 5 seconds and releasing a single test spray. Patient should contact pharmacy for refill when the dose counter reads "020". Discard device when the dose counter reads "000".

## 2015-12-22 NOTE — Progress Notes (Addendum)
Tommi Rumps, MD Phone: [phone]  Sandra Gray is a 45 y.o. female who presents today for follow-up.  HYPERTENSION  Disease Monitoring  Home BP Monitoring not checking Chest pain- no    Dyspnea- no Medications  Compliance-  taking amlodipine, lisinopril.  Edema- no  Bronchitis: Patient seen for asthma/bronchitis last week. Reports she is breathing better. She's completed the prednisone. She is using her albuterol nightly. Also using a humidifier. She notes no chest pain. No shortness of breath. Still some cough. Notes she typically has asthma and bronchitis issues throughout the winter yearly. No current wheezing. She's not on any controller medications.  Patient notes continued discomfort in her right thumb. X-ray of this was unremarkable. She also notes her left knee popped and gave out on her last week. Notes her joint issues typically occur when she has bronchitis and when the weather changes. She wants to go to preferred pain management for evaluation of this.   PMH: nonsmoker.   ROS see history of present illness  Objective  Physical Exam Vitals:   12/22/15 1600  BP: (!) 144/82  Pulse: 100  Temp: 98.7 F (37.1 C)    BP Readings from Last 3 Encounters:  12/22/15 (!) 144/82  12/16/15 128/82  11/04/15 112/68   Wt Readings from Last 3 Encounters:  12/22/15 217 lb 11.2 oz (98.7 kg)  12/16/15 212 lb (96.2 kg)  11/04/15 211 lb 6.4 oz (95.9 kg)    Physical Exam  Constitutional: No distress.  HENT:  Head: Normocephalic and atraumatic.  Cardiovascular: Normal rate, regular rhythm and normal heart sounds.   Pulmonary/Chest: Effort normal and breath sounds normal.  Musculoskeletal:  No tenderness of the right thumb or hand, full range of motion passively though actively cannot flex fully though she has 5 out of 5 strength on thumb flexion and extension on the right, no tenderness of the left thumb or hand, 5 out of 5 strength on thumb flexion and extension,  bilateral knees with no joint line tenderness, warmth, erythema, muscular tenderness, or bony tenderness, no ligamentous laxity, negative McMurray's, left knee did have some discomfort on anterior cruciate ligament testing  Neurological: She is alert.  Skin: She is not diaphoretic.     Assessment/Plan: Please see individual problem list.  Asthma with bronchitis Patient's symptoms have improved with prednisone and albuterol use. She has recurrent symptoms throughout the winter per her report. I discussed option of starting on a controller medicine with low-dose inhaled corticosteroid. She was concerned about cost relating to this if this was prescribed and wanted to know if we had samples. I discussed that the samples we have are not the typical starting dose, though are slightly higher and still considered the low dose ICS. I gave the option of a prescription vs samples and she opted for the sample. She is provided with a sample of Flovent to use twice daily for this. Discussed risk of throat irritation and thrush. I used the package insert to go over use of the inhaler with her. She voiced understanding. Follow-up in one month for recheck.  Hypertension Slightly above goal today. Suspect due to her discomfort. Has been well-controlled recently. She will continue her current medications and we will follow up with her in one month.  Pain of right thumb Overall benign exam with the exception of her having difficulty flexing her right thumb. X-ray was reassuring. Given this and her left knee discomfort which is concerning for a meniscal issue I advised orthopedic referral though she  was adamant that she go to preferred pain management for evaluation. Given this a referral was placed.   Orders Placed This Encounter  Procedures  . Ambulatory referral to Pain Clinic    Referral Priority:   Routine    Referral Type:   Consultation    Referral Reason:   Specialty Services Required    Requested  Specialty:   Pain Medicine    Number of Visits Requested:   1    Meds ordered this encounter  Medications  . fluticasone (FLOVENT HFA) 110 MCG/ACT inhaler    Sig: Inhale 1 puff into the lungs 2 (two) times daily.    Dispense:  1 Inhaler    Refill:  0    Tommi Rumps, MD [place]

## 2015-12-26 ENCOUNTER — Encounter: Payer: Self-pay | Admitting: Family Medicine

## 2015-12-30 ENCOUNTER — Telehealth: Payer: Self-pay | Admitting: Family Medicine

## 2015-12-30 NOTE — Telephone Encounter (Signed)
Pt dropped off FMLA papers for Dr. Caryl Bis to complete. Paper work is up front in [employer]

## 2016-01-02 NOTE — Telephone Encounter (Signed)
Forms in red folder

## 2016-01-11 NOTE — Telephone Encounter (Signed)
Pt came into office today for her FMLA papers. They were dropped off on 11/17. Please call when ready.

## 2016-01-11 NOTE — Telephone Encounter (Signed)
These are in your red folder

## 2016-01-19 NOTE — Telephone Encounter (Signed)
Attempted to call patient regarding FMLA papers. I wanted to determine what she needs leave of absence for prior to being able to complete the forms. I asked her to call back to the office. When she does call back please determine what she needs leave of absence for. Thanks.

## 2016-01-19 NOTE — Telephone Encounter (Signed)
Pt called back with regards to what needs to have her FMLA filled out. Please advise?  Call pt @ [phone]. Thank you!

## 2016-01-20 ENCOUNTER — Telehealth: Payer: Self-pay | Admitting: *Deleted

## 2016-01-20 NOTE — Telephone Encounter (Signed)
Spoke with patient and she is going to come get forms on Monday. The fax will not go through and I have called the [employer] and they do not have another fax number.

## 2016-01-20 NOTE — Telephone Encounter (Signed)
Pt requested to have forms faxed  [phone]

## 2016-01-20 NOTE — Telephone Encounter (Signed)
LM for patient to return call to determine if the patient wants forms faxed or she wants to pick them up

## 2016-01-23 NOTE — Telephone Encounter (Signed)
The patient came into the office today to pick up her FMLA paper work ,due to the [place] her that the fax would not go through. I informed the patient that if she did not mind waiting that I would e-mail the forms to [employer] . She responded that she would wait . I sent both set of forms to [email or web address]. I asked the patient to give me a call back if they needed the forms resent or updated.

## 2016-01-23 NOTE — Telephone Encounter (Signed)
LM for patient that paperwork would not go through. She is going to come pick them up. Placed them up front for her to pick up.

## 2016-01-24 NOTE — Telephone Encounter (Signed)
Patient picked up papers.

## 2016-01-30 DIAGNOSIS — Z7689 Persons encountering health services in other specified circumstances: Secondary | ICD-10-CM

## 2016-02-07 ENCOUNTER — Ambulatory Visit: Payer: Self-pay | Admitting: Family Medicine

## 2016-03-05 ENCOUNTER — Telehealth: Payer: Self-pay | Admitting: Family Medicine

## 2016-03-05 MED ORDER — AMLODIPINE BESYLATE 5 MG PO TABS: ORAL_TABLET | ORAL | 3 refills | Status: DC

## 2016-03-05 NOTE — Telephone Encounter (Signed)
Last filled by Dr.Walker 0714/17 90 [id number]

## 2016-03-05 NOTE — Telephone Encounter (Signed)
The patient is completely out of her medication she is needing a 90 day supply of her amlodipine sent to [facility] in [place] [address] today  amLODipine (NORVASC) 5 MG tablet

## 2016-03-05 NOTE — Telephone Encounter (Signed)
Sent to pharmacy 

## 2016-03-18 ENCOUNTER — Emergency Department (HOSPITAL_COMMUNITY)
Admission: EM | Admit: 2016-03-18 | Discharge: 2016-03-19 | Disposition: A | Discharge status: Home / Self Care | Payer: 59 | Attending: Emergency Medicine | Admitting: Emergency Medicine

## 2016-03-18 ENCOUNTER — Encounter (HOSPITAL_COMMUNITY): Payer: Self-pay | Admitting: *Deleted

## 2016-03-18 DIAGNOSIS — M25512 Pain in left shoulder: Secondary | ICD-10-CM | POA: Diagnosis not present

## 2016-03-18 DIAGNOSIS — M542 Cervicalgia: Secondary | ICD-10-CM

## 2016-03-18 DIAGNOSIS — Z79899 Other long term (current) drug therapy: Secondary | ICD-10-CM | POA: Insufficient documentation

## 2016-03-18 DIAGNOSIS — S7012XA Contusion of left thigh, initial encounter: Secondary | ICD-10-CM | POA: Diagnosis not present

## 2016-03-18 DIAGNOSIS — M79602 Pain in left arm: Secondary | ICD-10-CM | POA: Diagnosis not present

## 2016-03-18 DIAGNOSIS — Y92410 Unspecified street and highway as the place of occurrence of the external cause: Secondary | ICD-10-CM | POA: Diagnosis not present

## 2016-03-18 DIAGNOSIS — H00014 Hordeolum externum left upper eyelid: Secondary | ICD-10-CM | POA: Diagnosis not present

## 2016-03-18 DIAGNOSIS — Y999 Unspecified external cause status: Secondary | ICD-10-CM | POA: Diagnosis not present

## 2016-03-18 DIAGNOSIS — J45909 Unspecified asthma, uncomplicated: Secondary | ICD-10-CM | POA: Diagnosis not present

## 2016-03-18 DIAGNOSIS — H00011 Hordeolum externum right upper eyelid: Secondary | ICD-10-CM | POA: Insufficient documentation

## 2016-03-18 DIAGNOSIS — N189 Chronic kidney disease, unspecified: Secondary | ICD-10-CM | POA: Insufficient documentation

## 2016-03-18 DIAGNOSIS — M546 Pain in thoracic spine: Secondary | ICD-10-CM | POA: Diagnosis not present

## 2016-03-18 DIAGNOSIS — S79922A Unspecified injury of left thigh, initial encounter: Secondary | ICD-10-CM | POA: Diagnosis present

## 2016-03-18 DIAGNOSIS — M549 Dorsalgia, unspecified: Secondary | ICD-10-CM

## 2016-03-18 DIAGNOSIS — I129 Hypertensive chronic kidney disease with stage 1 through stage 4 chronic kidney disease, or unspecified chronic kidney disease: Secondary | ICD-10-CM | POA: Insufficient documentation

## 2016-03-18 DIAGNOSIS — R52 Pain, unspecified: Secondary | ICD-10-CM

## 2016-03-18 DIAGNOSIS — S299XXA Unspecified injury of thorax, initial encounter: Secondary | ICD-10-CM | POA: Diagnosis not present

## 2016-03-18 DIAGNOSIS — Y9389 Activity, other specified: Secondary | ICD-10-CM | POA: Diagnosis not present

## 2016-03-18 DIAGNOSIS — S199XXA Unspecified injury of neck, initial encounter: Secondary | ICD-10-CM | POA: Diagnosis not present
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
## 2016-03-19 ENCOUNTER — Emergency Department (HOSPITAL_COMMUNITY): Payer: 59

## 2016-03-19 ENCOUNTER — Telehealth: Payer: Self-pay | Admitting: Family Medicine

## 2016-03-19 DIAGNOSIS — M542 Cervicalgia: Secondary | ICD-10-CM | POA: Diagnosis not present

## 2016-03-19 DIAGNOSIS — S199XXA Unspecified injury of neck, initial encounter: Secondary | ICD-10-CM | POA: Diagnosis not present

## 2016-03-19 DIAGNOSIS — S299XXA Unspecified injury of thorax, initial encounter: Secondary | ICD-10-CM | POA: Diagnosis not present

## 2016-03-19 MED ORDER — NAPROXEN 500 MG PO TABS: 500.0000 mg | ORAL_TABLET | Freq: Two times a day (BID) | ORAL | 0 refills | Status: DC

## 2016-03-19 MED ORDER — TOBRAMYCIN 0.3 % OP SOLN: 1.0000 [drp] | OPHTHALMIC | 0 refills | Status: DC

## 2016-03-19 MED ORDER — CYCLOBENZAPRINE HCL 10 MG PO TABS: 10.0000 mg | ORAL_TABLET | Freq: Three times a day (TID) | ORAL | 0 refills | Status: DC | PRN

## 2016-03-19 MED ORDER — METHOCARBAMOL 500 MG PO TABS
1000.0000 mg | ORAL_TABLET | Freq: Once | ORAL | Status: AC
  Administered 2016-03-19: 1000 mg via ORAL
  Filled 2016-03-19: qty 2

## 2016-03-19 MED ORDER — KETOROLAC TROMETHAMINE 30 MG/ML IJ SOLN
60.0000 mg | Freq: Once | INTRAMUSCULAR | Status: AC
Start: 2016-03-19 — End: 2016-03-19
  Administered 2016-03-19: 60 mg via INTRAMUSCULAR
  Filled 2016-03-19: qty 2

## 2016-03-19 NOTE — Telephone Encounter (Signed)
Please advise 

## 2016-03-19 NOTE — Telephone Encounter (Signed)
Left message advising of below

## 2016-03-19 NOTE — Discharge Instructions (Signed)
Use ice and heat for comfort. Take the medications as prescribed. You can be rechecked by Dr Aline Brochure, the orthopedist on call if you are getting worse or not improving over the next couple of days. Return to the ED for any problems listed on the head injury sheet.
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
## 2016-03-19 NOTE — Telephone Encounter (Signed)
Patient will need to have a scheduled visit prior to getting a work note. She will need to be in a 30 minute slot for emergency room follow-up. Things.

## 2016-03-20 ENCOUNTER — Ambulatory Visit (INDEPENDENT_AMBULATORY_CARE_PROVIDER_SITE_OTHER): Payer: 59 | Admitting: Family

## 2016-03-20 ENCOUNTER — Encounter: Payer: Self-pay | Admitting: Family

## 2016-03-20 ENCOUNTER — Telehealth: Payer: Self-pay | Admitting: Family

## 2016-03-20 VITALS — BP 134/90 | HR 75 | Temp 98.5°F | Ht 62.0 in | Wt 220.0 lb

## 2016-03-20 DIAGNOSIS — M62838 Other muscle spasm: Secondary | ICD-10-CM

## 2016-03-20 NOTE — Patient Instructions (Signed)
Naprosyn twice per day- take with food  Take flexeril at bedtime  Heat, gentle stretches  Ice to bruise on left leg.
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
## 2016-03-20 NOTE — Progress Notes (Signed)
Pre visit review using our clinic review tool, if applicable. No additional management support is needed unless otherwise documented below in the visit note. 

## 2016-03-20 NOTE — Telephone Encounter (Signed)
Pt wanted a female provider. Dr. Caryl Bis and Vidal Schwalbe are ok with transfer.

## 2016-03-21 DIAGNOSIS — I10 Essential (primary) hypertension: Secondary | ICD-10-CM | POA: Diagnosis not present

## 2016-03-26 ENCOUNTER — Encounter: Payer: Self-pay | Admitting: Family

## 2016-03-26 NOTE — Telephone Encounter (Signed)
I have not seen this pt.  Please forward to her PCP.  Thanks.

## 2016-03-27 ENCOUNTER — Telehealth: Payer: Self-pay | Admitting: Family
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
## 2016-03-27 NOTE — Telephone Encounter (Signed)
Reason for call:neck pain/low back pain Symptoms: neck pain hair line above  shoulders, low back pain no radiation pain around  Duration 03/20/16 Medications: Naproxen not helping, flexeril , wondering should she discontinue naproxen since not helping and take 2 tylenol,  Last seen for this problem:03/20/16 Seen [id number]  Should she see chiropractor?

## 2016-03-27 NOTE — Telephone Encounter (Signed)
Pt called back and stated that they she went back to work yesterday and states that her neck and lower back. She wants to know if she should go to a chiropractor or something.

## 2016-03-28 ENCOUNTER — Other Ambulatory Visit: Payer: Self-pay | Admitting: Family

## 2016-03-28 DIAGNOSIS — M542 Cervicalgia: Secondary | ICD-10-CM

## 2016-03-28 NOTE — Telephone Encounter (Signed)
Spoke with patient she states she hasn't tried ibuprofen she has only tried Tylenol for pain since stopping Naproxen.  She wants to know which topical cream you would recommend to be the most effective . Please advise.

## 2016-03-29 NOTE — Telephone Encounter (Signed)
Left message for patient to return call back.  

## 2016-03-29 NOTE — Telephone Encounter (Signed)
Call pt-  She may take ibuprofen OTC prn.   200mg  every 6-8 hours.   For cream, she may try capsaicin; if Aspercreme is there, that is also equally as good.   Heat goes a long way too

## 2016-03-30 DIAGNOSIS — M7989 Other specified soft tissue disorders: Secondary | ICD-10-CM | POA: Diagnosis not present

## 2016-03-30 DIAGNOSIS — M25562 Pain in left knee: Secondary | ICD-10-CM | POA: Diagnosis not present

## 2016-03-30 DIAGNOSIS — M79641 Pain in right hand: Secondary | ICD-10-CM | POA: Diagnosis not present

## 2016-03-30 DIAGNOSIS — M79643 Pain in unspecified hand: Secondary | ICD-10-CM | POA: Diagnosis not present

## 2016-03-30 DIAGNOSIS — M6530 Trigger finger, unspecified finger: Secondary | ICD-10-CM | POA: Diagnosis not present

## 2016-03-30 DIAGNOSIS — M25569 Pain in unspecified knee: Secondary | ICD-10-CM | POA: Diagnosis not present

## 2016-03-30 NOTE — Telephone Encounter (Signed)
Patient has been informed.

## 2016-04-06 DIAGNOSIS — M6530 Trigger finger, unspecified finger: Secondary | ICD-10-CM | POA: Diagnosis not present

## 2016-04-06 DIAGNOSIS — M25569 Pain in unspecified knee: Secondary | ICD-10-CM | POA: Diagnosis not present

## 2016-04-06 NOTE — Telephone Encounter (Signed)
Pt called and stated that the [facility] paperwork that we filled out from the [place] in filled out incorrectly again. [employer] is resending the paperwork, one of the things that she stated that was not on the paperwork was her days that she was out of work which were from the 5th to the 9th. She asked that you either send her a my chart message or call her. Thank you!  Call pt @ [phone]

## 2016-04-25 ENCOUNTER — Ambulatory Visit (INDEPENDENT_AMBULATORY_CARE_PROVIDER_SITE_OTHER): Payer: 59 | Admitting: Family

## 2016-04-25 ENCOUNTER — Encounter: Payer: Self-pay | Admitting: Family

## 2016-04-25 ENCOUNTER — Other Ambulatory Visit: Payer: Self-pay | Admitting: Family

## 2016-04-25 VITALS — BP 136/78 | HR 100 | Temp 99.2°F | Ht 62.0 in | Wt 218.8 lb

## 2016-04-25 DIAGNOSIS — M5442 Lumbago with sciatica, left side: Secondary | ICD-10-CM | POA: Diagnosis not present

## 2016-04-25 DIAGNOSIS — I10 Essential (primary) hypertension: Secondary | ICD-10-CM | POA: Diagnosis not present

## 2016-04-25 DIAGNOSIS — M545 Low back pain, unspecified: Secondary | ICD-10-CM | POA: Insufficient documentation

## 2016-04-25 DIAGNOSIS — G8929 Other chronic pain: Secondary | ICD-10-CM | POA: Insufficient documentation

## 2016-04-25 DIAGNOSIS — Z1239 Encounter for other screening for malignant neoplasm of breast: Secondary | ICD-10-CM

## 2016-04-25 DIAGNOSIS — M79644 Pain in right finger(s): Secondary | ICD-10-CM

## 2016-04-25 DIAGNOSIS — Z1231 Encounter for screening mammogram for malignant neoplasm of breast: Secondary | ICD-10-CM

## 2016-04-25 LAB — COMPREHENSIVE METABOLIC PANEL
ALK PHOS: 81 U/L (ref 39–117)
ALT: 17 U/L (ref 0–35)
AST: 13 U/L (ref 0–37)
Albumin: 4.2 g/dL (ref 3.5–5.2)
BILIRUBIN TOTAL: 0.5 mg/dL (ref 0.2–1.2)
BUN: 13 mg/dL (ref 6–23)
CO2: 26 meq/L (ref 19–32)
Calcium: 10 mg/dL (ref 8.4–10.5)
Chloride: 106 mEq/L (ref 96–112)
Creatinine, Ser: 1.22 mg/dL — ABNORMAL HIGH (ref 0.40–1.20)
GFR: 61.77 mL/min (ref >60.00)
GLUCOSE: 101 mg/dL — AB (ref 70–99)
Potassium: 4.2 mEq/L (ref 3.5–5.1)
SODIUM: 140 meq/L (ref 135–145)
TOTAL PROTEIN: 8.4 g/dL — AB (ref 6.0–8.3)

## 2016-04-25 MED ORDER — GABAPENTIN 100 MG PO CAPS: 300.0000 mg | ORAL_CAPSULE | Freq: Every evening | ORAL | 0 refills | Status: DC | PRN

## 2016-04-25 MED ORDER — LIDOCAINE 5 % EX PTCH: 1.0000 | MEDICATED_PATCH | CUTANEOUS | 0 refills | Status: DC
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
## 2016-04-25 NOTE — Assessment & Plan Note (Signed)
Failed conservative treatment. She states she's had an injection and has been wearing a splint for several months. Offered orthopedic referral.she would prefer walk-in clinic and information was given for  walk-in clinic. Patient stated that she would go this week

## 2016-04-25 NOTE — Assessment & Plan Note (Signed)
Controlled. Continue current regimen. Pending BMP.

## 2016-04-25 NOTE — Assessment & Plan Note (Signed)
Due. Patient understands to schedule.
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
## 2016-04-25 NOTE — Patient Instructions (Addendum)
Labs   Trial  Gabapentin for leg pain.  Follow up one month   We placed a referral. Mammogram this year. I asked that you call one the below locations and schedule this when it is convenient for you.   If you have dense breasts, you may ask for 3D mammogram over the traditional 2D mammogram as new evidence suggest 3D is superior. Please note that NOT all insurance companies cover 3D and you may have to pay a higher copay. You may call your insurance company to further clarify your benefits.   Options for [place]  [place], [place]  * [facility] [place], [place] * Note if you ask for 3D mammogram at this location, you must request [place], [place] location*   As discussed, you may go to walk in orthopedic clinic. Information below:  [facility] [address]  M-F 1-7:30pm  [phone]

## 2016-04-25 NOTE — Progress Notes (Signed)
Pre visit review using our clinic review tool, if applicable. No additional management support is needed unless otherwise documented below in the visit note. 

## 2016-05-16 ENCOUNTER — Encounter: Payer: Self-pay | Admitting: Family

## 2016-05-24 ENCOUNTER — Ambulatory Visit: Payer: Self-pay | Admitting: Family

## 2016-06-05 ENCOUNTER — Ambulatory Visit
Admission: RE | Admit: 2016-06-05 | Discharge: 2016-06-05 | Disposition: A | Discharge status: Home / Self Care | Payer: 59 | Source: Ambulatory Visit | Attending: Family | Admitting: Family

## 2016-06-05 DIAGNOSIS — Z1231 Encounter for screening mammogram for malignant neoplasm of breast: Secondary | ICD-10-CM | POA: Diagnosis not present

## 2016-06-05 DIAGNOSIS — Z1239 Encounter for other screening for malignant neoplasm of breast: Secondary | ICD-10-CM

## 2016-06-06 ENCOUNTER — Other Ambulatory Visit: Payer: Self-pay | Admitting: Family

## 2016-06-06 DIAGNOSIS — N6489 Other specified disorders of breast: Secondary | ICD-10-CM

## 2016-06-06 DIAGNOSIS — R928 Other abnormal and inconclusive findings on diagnostic imaging of breast: Secondary | ICD-10-CM

## 2016-06-08 DIAGNOSIS — M4302 Spondylolysis, cervical region: Secondary | ICD-10-CM | POA: Diagnosis not present

## 2016-06-08 DIAGNOSIS — M545 Low back pain: Secondary | ICD-10-CM | POA: Diagnosis not present

## 2016-06-08 DIAGNOSIS — M5126 Other intervertebral disc displacement, lumbar region: Secondary | ICD-10-CM | POA: Diagnosis not present

## 2016-06-11 ENCOUNTER — Encounter: Payer: Self-pay | Admitting: Family

## 2016-06-12 ENCOUNTER — Other Ambulatory Visit: Payer: Self-pay | Admitting: Family

## 2016-06-12 ENCOUNTER — Ambulatory Visit
Admission: RE | Admit: 2016-06-12 | Discharge: 2016-06-12 | Disposition: A | Discharge status: Home / Self Care | Payer: 59 | Source: Ambulatory Visit | Attending: Family | Admitting: Family

## 2016-06-12 DIAGNOSIS — R928 Other abnormal and inconclusive findings on diagnostic imaging of breast: Secondary | ICD-10-CM

## 2016-06-12 DIAGNOSIS — N6320 Unspecified lump in the left breast, unspecified quadrant: Secondary | ICD-10-CM | POA: Diagnosis not present

## 2016-06-12 DIAGNOSIS — N6489 Other specified disorders of breast: Secondary | ICD-10-CM

## 2016-06-13 DIAGNOSIS — M791 Myalgia: Secondary | ICD-10-CM | POA: Diagnosis not present

## 2016-06-13 DIAGNOSIS — M792 Neuralgia and neuritis, unspecified: Secondary | ICD-10-CM | POA: Diagnosis not present

## 2016-06-13 DIAGNOSIS — M545 Low back pain: Secondary | ICD-10-CM | POA: Diagnosis not present

## 2016-06-14 ENCOUNTER — Encounter: Payer: Self-pay | Admitting: Family

## 2016-06-15 DIAGNOSIS — M791 Myalgia: Secondary | ICD-10-CM | POA: Diagnosis not present

## 2016-06-15 DIAGNOSIS — M545 Low back pain: Secondary | ICD-10-CM | POA: Diagnosis not present

## 2016-06-15 DIAGNOSIS — M792 Neuralgia and neuritis, unspecified: Secondary | ICD-10-CM | POA: Diagnosis not present

## 2016-06-18 ENCOUNTER — Encounter: Payer: Self-pay | Admitting: Family

## 2016-06-18 ENCOUNTER — Ambulatory Visit (INDEPENDENT_AMBULATORY_CARE_PROVIDER_SITE_OTHER): Payer: 59 | Admitting: Family

## 2016-06-18 VITALS — BP 138/84 | HR 107 | Temp 98.6°F | Ht 62.0 in | Wt 220.4 lb

## 2016-06-18 DIAGNOSIS — M5442 Lumbago with sciatica, left side: Secondary | ICD-10-CM | POA: Diagnosis not present

## 2016-06-18 DIAGNOSIS — I10 Essential (primary) hypertension: Secondary | ICD-10-CM | POA: Diagnosis not present
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
## 2016-06-18 NOTE — Progress Notes (Signed)
Pre visit review using our clinic review tool, if applicable. No additional management support is needed unless otherwise documented below in the visit note. 

## 2016-06-18 NOTE — Assessment & Plan Note (Addendum)
Unchanged. FMLA paperwork completed. Will continue to follow.

## 2016-06-19 ENCOUNTER — Encounter: Payer: Self-pay | Admitting: Family

## 2016-06-19 DIAGNOSIS — G8929 Other chronic pain: Secondary | ICD-10-CM | POA: Diagnosis not present

## 2016-06-19 DIAGNOSIS — M791 Myalgia: Secondary | ICD-10-CM | POA: Diagnosis not present

## 2016-06-19 DIAGNOSIS — M5442 Lumbago with sciatica, left side: Secondary | ICD-10-CM | POA: Diagnosis not present

## 2016-06-19 NOTE — Assessment & Plan Note (Addendum)
Overall controlled on current regimen.  Suspect HR elevated from anxiety, pain, and carrying walker in.  Will continue to follow every 3 months.

## 2016-06-21 DIAGNOSIS — M545 Low back pain: Secondary | ICD-10-CM | POA: Diagnosis not present

## 2016-06-21 DIAGNOSIS — M791 Myalgia: Secondary | ICD-10-CM | POA: Diagnosis not present

## 2016-06-21 DIAGNOSIS — M792 Neuralgia and neuritis, unspecified: Secondary | ICD-10-CM | POA: Diagnosis not present

## 2016-06-22 DIAGNOSIS — M791 Myalgia: Secondary | ICD-10-CM | POA: Diagnosis not present

## 2016-06-22 DIAGNOSIS — M792 Neuralgia and neuritis, unspecified: Secondary | ICD-10-CM | POA: Diagnosis not present

## 2016-06-22 DIAGNOSIS — M545 Low back pain: Secondary | ICD-10-CM | POA: Diagnosis not present

## 2016-06-25 DIAGNOSIS — M792 Neuralgia and neuritis, unspecified: Secondary | ICD-10-CM | POA: Diagnosis not present

## 2016-06-25 DIAGNOSIS — M791 Myalgia: Secondary | ICD-10-CM | POA: Diagnosis not present

## 2016-06-25 DIAGNOSIS — M545 Low back pain: Secondary | ICD-10-CM | POA: Diagnosis not present

## 2016-06-28 DIAGNOSIS — M791 Myalgia: Secondary | ICD-10-CM | POA: Diagnosis not present

## 2016-06-28 DIAGNOSIS — M545 Low back pain: Secondary | ICD-10-CM | POA: Diagnosis not present

## 2016-06-28 DIAGNOSIS — M792 Neuralgia and neuritis, unspecified: Secondary | ICD-10-CM | POA: Diagnosis not present

## 2016-06-29 DIAGNOSIS — M545 Low back pain: Secondary | ICD-10-CM | POA: Diagnosis not present

## 2016-06-29 DIAGNOSIS — M791 Myalgia: Secondary | ICD-10-CM | POA: Diagnosis not present

## 2016-06-29 DIAGNOSIS — M792 Neuralgia and neuritis, unspecified: Secondary | ICD-10-CM | POA: Diagnosis not present

## 2016-07-02 ENCOUNTER — Encounter: Payer: Self-pay | Admitting: Family

## 2016-07-02 DIAGNOSIS — M545 Low back pain: Secondary | ICD-10-CM | POA: Diagnosis not present

## 2016-07-02 DIAGNOSIS — M792 Neuralgia and neuritis, unspecified: Secondary | ICD-10-CM | POA: Diagnosis not present

## 2016-07-02 DIAGNOSIS — M791 Myalgia: Secondary | ICD-10-CM | POA: Diagnosis not present

## 2016-07-04 DIAGNOSIS — M5442 Lumbago with sciatica, left side: Secondary | ICD-10-CM | POA: Diagnosis not present

## 2016-07-04 DIAGNOSIS — M791 Myalgia: Secondary | ICD-10-CM | POA: Diagnosis not present

## 2016-07-04 DIAGNOSIS — G8929 Other chronic pain: Secondary | ICD-10-CM | POA: Diagnosis not present

## 2016-07-05 ENCOUNTER — Encounter: Payer: Self-pay | Admitting: Family

## 2016-07-06 DIAGNOSIS — M791 Myalgia: Secondary | ICD-10-CM | POA: Diagnosis not present

## 2016-07-06 DIAGNOSIS — M545 Low back pain: Secondary | ICD-10-CM | POA: Diagnosis not present

## 2016-07-06 DIAGNOSIS — M792 Neuralgia and neuritis, unspecified: Secondary | ICD-10-CM | POA: Diagnosis not present

## 2016-07-10 DIAGNOSIS — M792 Neuralgia and neuritis, unspecified: Secondary | ICD-10-CM | POA: Diagnosis not present

## 2016-07-10 DIAGNOSIS — M791 Myalgia: Secondary | ICD-10-CM | POA: Diagnosis not present

## 2016-07-10 DIAGNOSIS — M545 Low back pain: Secondary | ICD-10-CM | POA: Diagnosis not present

## 2016-07-12 DIAGNOSIS — M791 Myalgia: Secondary | ICD-10-CM | POA: Diagnosis not present

## 2016-07-12 DIAGNOSIS — M545 Low back pain: Secondary | ICD-10-CM | POA: Diagnosis not present

## 2016-07-12 DIAGNOSIS — M792 Neuralgia and neuritis, unspecified: Secondary | ICD-10-CM | POA: Diagnosis not present

## 2016-07-13 DIAGNOSIS — M792 Neuralgia and neuritis, unspecified: Secondary | ICD-10-CM | POA: Diagnosis not present

## 2016-07-13 DIAGNOSIS — M791 Myalgia: Secondary | ICD-10-CM | POA: Diagnosis not present

## 2016-07-13 DIAGNOSIS — M545 Low back pain: Secondary | ICD-10-CM | POA: Diagnosis not present

## 2016-07-16 DIAGNOSIS — M545 Low back pain: Secondary | ICD-10-CM | POA: Diagnosis not present

## 2016-07-16 DIAGNOSIS — M792 Neuralgia and neuritis, unspecified: Secondary | ICD-10-CM | POA: Diagnosis not present

## 2016-07-16 DIAGNOSIS — M791 Myalgia: Secondary | ICD-10-CM | POA: Diagnosis not present

## 2016-07-19 ENCOUNTER — Encounter: Payer: Self-pay | Admitting: Family

## 2016-07-19 DIAGNOSIS — M791 Myalgia: Secondary | ICD-10-CM | POA: Diagnosis not present

## 2016-07-19 DIAGNOSIS — M545 Low back pain: Secondary | ICD-10-CM | POA: Diagnosis not present

## 2016-07-19 DIAGNOSIS — M792 Neuralgia and neuritis, unspecified: Secondary | ICD-10-CM | POA: Diagnosis not present

## 2016-07-20 DIAGNOSIS — M792 Neuralgia and neuritis, unspecified: Secondary | ICD-10-CM | POA: Diagnosis not present

## 2016-07-20 DIAGNOSIS — M545 Low back pain: Secondary | ICD-10-CM | POA: Diagnosis not present

## 2016-07-20 DIAGNOSIS — M791 Myalgia: Secondary | ICD-10-CM | POA: Diagnosis not present

## 2016-07-23 DIAGNOSIS — M792 Neuralgia and neuritis, unspecified: Secondary | ICD-10-CM | POA: Diagnosis not present

## 2016-07-23 DIAGNOSIS — M791 Myalgia: Secondary | ICD-10-CM | POA: Diagnosis not present

## 2016-07-23 DIAGNOSIS — M545 Low back pain: Secondary | ICD-10-CM | POA: Diagnosis not present

## 2016-07-26 DIAGNOSIS — M545 Low back pain: Secondary | ICD-10-CM | POA: Diagnosis not present

## 2016-07-26 DIAGNOSIS — M792 Neuralgia and neuritis, unspecified: Secondary | ICD-10-CM | POA: Diagnosis not present

## 2016-07-26 DIAGNOSIS — M791 Myalgia: Secondary | ICD-10-CM | POA: Diagnosis not present

## 2016-07-27 DIAGNOSIS — M791 Myalgia: Secondary | ICD-10-CM | POA: Diagnosis not present

## 2016-07-27 DIAGNOSIS — G8929 Other chronic pain: Secondary | ICD-10-CM | POA: Diagnosis not present

## 2016-07-27 DIAGNOSIS — M545 Low back pain: Secondary | ICD-10-CM | POA: Diagnosis not present

## 2016-07-27 DIAGNOSIS — M5442 Lumbago with sciatica, left side: Secondary | ICD-10-CM | POA: Diagnosis not present

## 2016-07-27 DIAGNOSIS — M792 Neuralgia and neuritis, unspecified: Secondary | ICD-10-CM | POA: Diagnosis not present

## 2016-07-30 DIAGNOSIS — M792 Neuralgia and neuritis, unspecified: Secondary | ICD-10-CM | POA: Diagnosis not present

## 2016-07-30 DIAGNOSIS — M791 Myalgia: Secondary | ICD-10-CM | POA: Diagnosis not present

## 2016-07-30 DIAGNOSIS — M545 Low back pain: Secondary | ICD-10-CM | POA: Diagnosis not present

## 2016-07-31 ENCOUNTER — Other Ambulatory Visit: Payer: Self-pay

## 2016-07-31 DIAGNOSIS — M791 Myalgia: Secondary | ICD-10-CM | POA: Diagnosis not present

## 2016-07-31 DIAGNOSIS — M545 Low back pain: Secondary | ICD-10-CM | POA: Diagnosis not present

## 2016-07-31 DIAGNOSIS — M792 Neuralgia and neuritis, unspecified: Secondary | ICD-10-CM | POA: Diagnosis not present

## 2016-07-31 MED ORDER — NORETHINDRONE ACETATE 5 MG PO TABS: 5.0000 mg | ORAL_TABLET | Freq: Every day | ORAL | 2 refills | Status: DC

## 2016-07-31 MED ORDER — LISINOPRIL 5 MG PO TABS: ORAL_TABLET | ORAL | 2 refills | Status: DC

## 2016-07-31 NOTE — Telephone Encounter (Signed)
Medication has been refilled.

## 2016-08-06 DIAGNOSIS — M791 Myalgia: Secondary | ICD-10-CM | POA: Diagnosis not present

## 2016-08-06 DIAGNOSIS — M545 Low back pain: Secondary | ICD-10-CM | POA: Diagnosis not present

## 2016-08-06 DIAGNOSIS — M792 Neuralgia and neuritis, unspecified: Secondary | ICD-10-CM | POA: Diagnosis not present

## 2016-08-10 DIAGNOSIS — M545 Low back pain: Secondary | ICD-10-CM | POA: Diagnosis not present

## 2016-08-10 DIAGNOSIS — M791 Myalgia: Secondary | ICD-10-CM | POA: Diagnosis not present

## 2016-08-10 DIAGNOSIS — M792 Neuralgia and neuritis, unspecified: Secondary | ICD-10-CM | POA: Diagnosis not present

## 2016-08-16 DIAGNOSIS — M791 Myalgia: Secondary | ICD-10-CM | POA: Diagnosis not present

## 2016-08-16 DIAGNOSIS — M792 Neuralgia and neuritis, unspecified: Secondary | ICD-10-CM | POA: Diagnosis not present

## 2016-08-16 DIAGNOSIS — M545 Low back pain: Secondary | ICD-10-CM | POA: Diagnosis not present

## 2016-08-17 DIAGNOSIS — M791 Myalgia: Secondary | ICD-10-CM | POA: Diagnosis not present

## 2016-08-17 DIAGNOSIS — M792 Neuralgia and neuritis, unspecified: Secondary | ICD-10-CM | POA: Diagnosis not present

## 2016-08-17 DIAGNOSIS — M545 Low back pain: Secondary | ICD-10-CM | POA: Diagnosis not present

## 2016-08-23 DIAGNOSIS — M791 Myalgia: Secondary | ICD-10-CM | POA: Diagnosis not present

## 2016-08-23 DIAGNOSIS — M792 Neuralgia and neuritis, unspecified: Secondary | ICD-10-CM | POA: Diagnosis not present

## 2016-08-23 DIAGNOSIS — M545 Low back pain: Secondary | ICD-10-CM | POA: Diagnosis not present

## 2016-08-27 DIAGNOSIS — M791 Myalgia: Secondary | ICD-10-CM | POA: Diagnosis not present

## 2016-08-27 DIAGNOSIS — M545 Low back pain: Secondary | ICD-10-CM | POA: Diagnosis not present

## 2016-08-27 DIAGNOSIS — M792 Neuralgia and neuritis, unspecified: Secondary | ICD-10-CM | POA: Diagnosis not present

## 2016-08-28 DIAGNOSIS — M545 Low back pain: Secondary | ICD-10-CM | POA: Diagnosis not present

## 2016-08-28 DIAGNOSIS — M792 Neuralgia and neuritis, unspecified: Secondary | ICD-10-CM | POA: Diagnosis not present

## 2016-08-28 DIAGNOSIS — M791 Myalgia: Secondary | ICD-10-CM | POA: Diagnosis not present

## 2016-08-30 DIAGNOSIS — M545 Low back pain: Secondary | ICD-10-CM | POA: Diagnosis not present

## 2016-08-30 DIAGNOSIS — M791 Myalgia: Secondary | ICD-10-CM | POA: Diagnosis not present

## 2016-08-30 DIAGNOSIS — M792 Neuralgia and neuritis, unspecified: Secondary | ICD-10-CM | POA: Diagnosis not present

## 2016-09-03 DIAGNOSIS — M545 Low back pain: Secondary | ICD-10-CM | POA: Diagnosis not present

## 2016-09-03 DIAGNOSIS — M792 Neuralgia and neuritis, unspecified: Secondary | ICD-10-CM | POA: Diagnosis not present

## 2016-09-03 DIAGNOSIS — M791 Myalgia: Secondary | ICD-10-CM | POA: Diagnosis not present

## 2016-09-04 DIAGNOSIS — M791 Myalgia: Secondary | ICD-10-CM | POA: Diagnosis not present

## 2016-09-04 DIAGNOSIS — M545 Low back pain: Secondary | ICD-10-CM | POA: Diagnosis not present

## 2016-09-04 DIAGNOSIS — M792 Neuralgia and neuritis, unspecified: Secondary | ICD-10-CM | POA: Diagnosis not present

## 2016-09-10 DIAGNOSIS — M545 Low back pain: Secondary | ICD-10-CM | POA: Diagnosis not present

## 2016-09-10 DIAGNOSIS — M791 Myalgia: Secondary | ICD-10-CM | POA: Diagnosis not present

## 2016-09-10 DIAGNOSIS — M792 Neuralgia and neuritis, unspecified: Secondary | ICD-10-CM | POA: Diagnosis not present

## 2016-09-13 DIAGNOSIS — M791 Myalgia: Secondary | ICD-10-CM | POA: Diagnosis not present

## 2016-09-13 DIAGNOSIS — M792 Neuralgia and neuritis, unspecified: Secondary | ICD-10-CM | POA: Diagnosis not present

## 2016-09-13 DIAGNOSIS — M545 Low back pain: Secondary | ICD-10-CM | POA: Diagnosis not present

## 2016-09-17 ENCOUNTER — Ambulatory Visit: Payer: Self-pay | Admitting: Family

## 2016-09-17 DIAGNOSIS — M791 Myalgia: Secondary | ICD-10-CM | POA: Diagnosis not present

## 2016-09-17 DIAGNOSIS — M792 Neuralgia and neuritis, unspecified: Secondary | ICD-10-CM | POA: Diagnosis not present

## 2016-09-17 DIAGNOSIS — M545 Low back pain: Secondary | ICD-10-CM | POA: Diagnosis not present

## 2016-09-21 DIAGNOSIS — M5442 Lumbago with sciatica, left side: Secondary | ICD-10-CM | POA: Diagnosis not present

## 2016-09-21 DIAGNOSIS — G8929 Other chronic pain: Secondary | ICD-10-CM | POA: Diagnosis not present

## 2016-09-21 DIAGNOSIS — M4302 Spondylolysis, cervical region: Secondary | ICD-10-CM | POA: Diagnosis not present

## 2016-09-24 ENCOUNTER — Ambulatory Visit (INDEPENDENT_AMBULATORY_CARE_PROVIDER_SITE_OTHER): Payer: 59 | Admitting: Family

## 2016-09-24 ENCOUNTER — Encounter: Payer: Self-pay | Admitting: Family

## 2016-09-24 VITALS — BP 136/78 | HR 100 | Temp 98.8°F | Ht 62.0 in | Wt 228.2 lb

## 2016-09-24 DIAGNOSIS — I10 Essential (primary) hypertension: Secondary | ICD-10-CM

## 2016-09-24 DIAGNOSIS — M5442 Lumbago with sciatica, left side: Secondary | ICD-10-CM

## 2016-09-24 DIAGNOSIS — Z79890 Hormone replacement therapy (postmenopausal): Secondary | ICD-10-CM | POA: Diagnosis not present

## 2016-09-24 LAB — BASIC METABOLIC PANEL
BUN: 11 mg/dL (ref 6–23)
CHLORIDE: 106 meq/L (ref 96–112)
CO2: 25 meq/L (ref 19–32)
Calcium: 9.7 mg/dL (ref 8.4–10.5)
Creatinine, Ser: 1.2 mg/dL (ref 0.40–1.20)
GFR: 62.84 mL/min (ref >60.00)
GLUCOSE: 108 mg/dL — AB (ref 70–99)
POTASSIUM: 3.8 meq/L (ref 3.5–5.1)
Sodium: 139 mEq/L (ref 135–145)

## 2016-09-24 NOTE — Assessment & Plan Note (Signed)
Discussed with patient risks of breast cancer hormone replacement therapy as well as stroke on HRT.  patient will slowly try to decrease her estrogen dose, with the long-term goal of discontinuing completely. Will follow

## 2016-09-24 NOTE — Assessment & Plan Note (Signed)
Stable. Well-controlled. Will Continue regimen at this time.

## 2016-09-24 NOTE — Assessment & Plan Note (Signed)
Chronic. No improvement. Patient would like second opinion with neurosurgeon has not passed she's only been seen by orthopedics. Referral placed. Will follow

## 2016-09-24 NOTE — Patient Instructions (Addendum)
Pleasure always seeing you and just so sorry about your pain.   Referral placed  Trial decreasing estrogen as discussed.   Labs today
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
## 2016-09-24 NOTE — Progress Notes (Signed)
Pre visit review using our clinic review tool, if applicable. No additional management support is needed unless otherwise documented below in the visit note. 

## 2016-09-27 DIAGNOSIS — M791 Myalgia: Secondary | ICD-10-CM | POA: Diagnosis not present

## 2016-09-27 DIAGNOSIS — M792 Neuralgia and neuritis, unspecified: Secondary | ICD-10-CM | POA: Diagnosis not present

## 2016-09-27 DIAGNOSIS — M545 Low back pain: Secondary | ICD-10-CM | POA: Diagnosis not present

## 2016-10-01 DIAGNOSIS — M545 Low back pain: Secondary | ICD-10-CM | POA: Diagnosis not present

## 2016-10-01 DIAGNOSIS — M791 Myalgia: Secondary | ICD-10-CM | POA: Diagnosis not present

## 2016-10-01 DIAGNOSIS — M792 Neuralgia and neuritis, unspecified: Secondary | ICD-10-CM | POA: Diagnosis not present

## 2016-10-04 DIAGNOSIS — M545 Low back pain: Secondary | ICD-10-CM | POA: Diagnosis not present

## 2016-10-04 DIAGNOSIS — M791 Myalgia: Secondary | ICD-10-CM | POA: Diagnosis not present

## 2016-10-04 DIAGNOSIS — M792 Neuralgia and neuritis, unspecified: Secondary | ICD-10-CM | POA: Diagnosis not present

## 2016-10-05 DIAGNOSIS — I10 Essential (primary) hypertension: Secondary | ICD-10-CM | POA: Diagnosis not present

## 2016-10-05 DIAGNOSIS — M5137 Other intervertebral disc degeneration, lumbosacral region: Secondary | ICD-10-CM | POA: Diagnosis not present

## 2016-10-11 DIAGNOSIS — M545 Low back pain: Secondary | ICD-10-CM | POA: Diagnosis not present

## 2016-10-11 DIAGNOSIS — M791 Myalgia: Secondary | ICD-10-CM | POA: Diagnosis not present

## 2016-10-11 DIAGNOSIS — M792 Neuralgia and neuritis, unspecified: Secondary | ICD-10-CM | POA: Diagnosis not present

## 2016-10-16 ENCOUNTER — Other Ambulatory Visit: Payer: Self-pay

## 2016-10-16 MED ORDER — LYRICA 75 MG PO CAPS: 75.0000 mg | ORAL_CAPSULE | Freq: Every day | ORAL | 1 refills | Status: DC

## 2016-10-17 ENCOUNTER — Other Ambulatory Visit: Payer: Self-pay

## 2016-10-17 MED ORDER — AMLODIPINE BESYLATE 5 MG PO TABS: ORAL_TABLET | ORAL | 3 refills | Status: DC

## 2016-10-25 ENCOUNTER — Encounter: Payer: Self-pay | Admitting: Family

## 2016-10-25 ENCOUNTER — Ambulatory Visit (INDEPENDENT_AMBULATORY_CARE_PROVIDER_SITE_OTHER): Payer: 59 | Admitting: Family

## 2016-10-25 VITALS — BP 132/80 | HR 114 | Temp 99.3°F | Resp 16 | Ht 62.0 in | Wt 228.6 lb

## 2016-10-25 DIAGNOSIS — R21 Rash and other nonspecific skin eruption: Secondary | ICD-10-CM

## 2016-10-25 DIAGNOSIS — M5442 Lumbago with sciatica, left side: Secondary | ICD-10-CM | POA: Diagnosis not present

## 2016-10-25 MED ORDER — TRIAMCINOLONE ACETONIDE 0.025 % EX CREA: 1.0000 "application " | TOPICAL_CREAM | Freq: Two times a day (BID) | CUTANEOUS | 1 refills | Status: DC

## 2016-10-25 MED ORDER — DULOXETINE HCL 30 MG PO CPEP: ORAL_CAPSULE | ORAL | 3 refills | Status: DC

## 2016-10-25 NOTE — Assessment & Plan Note (Signed)
Chronic. Trial Cymbalta to see if this helps with symptoms. Also referral to Dr. Sharlet Salina for second opinion for low back pain. Discontinued Lyrica. Follow-up in 6-8 weeks.

## 2016-10-25 NOTE — Progress Notes (Signed)
Subjective:    Patient ID: Sandra Gray, female    DOB: February 16, 1970, 46 y.o.   MRN: [id number]  CC: Sandra Gray is a 46 y.o. female who presents today for follow up.   HPI: Chronic low back pain , unchanged. Would like BMI form filled out for work.  Has seen orthopedics since our last visit and feels frustrated by lack of progress.    Has recently seen an neurosurgery, Dr Trenton Gammon, who stated surgical intervention was not indicated.   No longer doing PT or following chiropractor.   Lyrica isnt helping. Using tens unit with ice with relief.  Using walker.   Also reports periodic itchy, dry scaly rash left foot. Has resolved at this time. Resolve a coconut oil. History of eczema.        HISTORY:  Past Medical History:  Diagnosis Date  . Asthma 2008  . Bronchitis   . Chronic kidney disease    "declining kidney, but it's stable right now"  . Fibroids   . Hypertension 2013   Started on lisinopril  . Pneumonia    Past Surgical History:  Procedure Laterality Date  . DILATION AND CURETTAGE OF UTERUS  08/2011  . HYSTEROSCOPY  02/15/2012   Fibroid Removal  . HYSTEROSCOPY  may 2014  . ROBOTIC ASSISTED SALPINGO OOPHERECTOMY Bilateral 07/28/2015   Procedure: XI ROBOTIC ASSISTED BILATERAL SALPINGO OOPHORECTOMY AND LYSIS OF ADHESIONS;  Surgeon: Everitt Amber, MD;  Location: [facility];  Service: Gynecology;  Laterality: Bilateral;  . SUPRACERVICAL ABDOMINAL HYSTERECTOMY N/A 08/31/2014   Procedure: HYSTERECTOMY SUPRACERVICAL ABDOMINAL;  Surgeon: [name], MD;  Location: [facility];  Service: Gynecology;  Laterality: N/A;  Dr. Elonda Husky will assist   Family History  Problem Relation Age of Onset  . Hypertension Mother   . Hypertension Father   . Diabetes Father   . Kidney disease Maternal Grandmother   . Cancer Maternal Grandfather        Lung - Asbestos exposure  . Diabetes Paternal Grandmother   . Heart disease Paternal Grandfather   . Breast cancer Neg Hx     Allergies:  Gadolinium derivatives Current Outpatient Prescriptions on File Prior to Visit  Medication Sig Dispense Refill  . albuterol (PROVENTIL HFA;VENTOLIN HFA) 108 (90 Base) MCG/ACT inhaler Inhale 2 puffs into the lungs every 6 (six) hours as needed for wheezing. 1 Inhaler 2  . albuterol (PROVENTIL) (2.5 MG/3ML) 0.083% nebulizer solution Take 3 mLs (2.5 mg total) by nebulization every 6 (six) hours as needed for wheezing or shortness of breath. 150 mL 1  . amLODipine (NORVASC) 5 MG tablet Take 1 tablet by mouth  daily 90 tablet 3  . cyclobenzaprine (FLEXERIL) 10 MG tablet Take 1 tablet (10 mg total) by mouth 3 (three) times daily as needed for muscle spasms. 15 tablet 0  . fluticasone (FLOVENT HFA) 110 MCG/ACT inhaler Inhale 1 puff into the lungs 2 (two) times daily. 1 Inhaler 0  . lidocaine (LIDODERM) 5 % Place 1 patch onto the skin daily. Remove & Discard patch within 12 hours. 30 patch 0  . lisinopril (PRINIVIL,ZESTRIL) 5 MG tablet Take 1 tablet by mouth  daily 90 tablet 2  . norethindrone (AYGESTIN) 5 MG tablet Take 1 tablet (5 mg total) by mouth daily. 90 tablet 2  . tobramycin (TOBREX) 0.3 % ophthalmic solution Place 1 drop into the right eye every 4 (four) hours while awake. 5 mL 0   No current facility-administered medications on file prior to visit.  Social History  Substance Use Topics  . Smoking status: Never Smoker  . Smokeless tobacco: Never Used  . Alcohol use 0.6 oz/week    1 Standard drinks or equivalent per week     Comment: Rarely    Review of Systems  Constitutional: Negative for chills and fever.  Respiratory: Negative for cough.   Cardiovascular: Negative for chest pain and palpitations.  Gastrointestinal: Negative for nausea and vomiting.  Musculoskeletal: Positive for back pain.  Neurological: Positive for numbness. Negative for weakness.      Objective:    BP 132/80 (BP Location: Left Arm, Patient Position: Sitting, Cuff Size: Large)   Pulse (!) 114   Temp  99.3 F (37.4 C) (Oral)   Resp 16   Ht 5\' 2"  (1.575 m)   Wt 228 lb 9.6 oz (103.7 kg)   LMP 08/02/2014   SpO2 96%   BMI 41.81 kg/m  BP Readings from Last 3 Encounters:  10/25/16 132/80  09/24/16 136/78  06/18/16 138/84   Wt Readings from Last 3 Encounters:  10/25/16 228 lb 9.6 oz (103.7 kg)  09/24/16 228 lb 3.2 oz (103.5 kg)  06/18/16 220 lb 6.4 oz (100 kg)    Physical Exam  Constitutional: She appears well-developed and well-nourished.  Eyes: Conjunctivae are normal.  Cardiovascular: Normal rate, regular rhythm, normal heart sounds and normal pulses.   Pulmonary/Chest: Effort normal and breath sounds normal. She has no wheezes. She has no rhonchi. She has no rales.  Neurological: She is alert.  Skin: Skin is warm and dry.  Psychiatric: She has a normal mood and affect. Her speech is normal and behavior is normal. Thought content normal.  Vitals reviewed.      Assessment & Plan:   Problem List Items Addressed This Visit      Musculoskeletal and Integument   Rash    No rash today to evaluate. Symptoms of dry scaly, itchy support either dry skin or eczema. Giving patient trial of topical corticosteroid. Patient will let me know if not better.      Relevant Medications   triamcinolone (KENALOG) 0.025 % cream     Other   Acute midline low back pain with left-sided sciatica - Primary    Chronic. Trial Cymbalta to see if this helps with symptoms. Also referral to Dr. Sharlet Salina for second opinion for low back pain. Discontinued Lyrica. Follow-up in 6-8 weeks.      Relevant Medications   DULoxetine (CYMBALTA) 30 MG capsule   Other Relevant Orders   Ambulatory referral to Orthopedic Surgery       I have discontinued Ms. Laurian Edrington. I am also having her start on DULoxetine and triamcinolone. Additionally, I am having her maintain her albuterol, albuterol, fluticasone, cyclobenzaprine, tobramycin, lidocaine, lisinopril, norethindrone, and amLODipine.   Meds ordered  this encounter  Medications  . DULoxetine (CYMBALTA) 30 MG capsule    Sig: Take one 30 mg tablet by mouth once a day for the first week. Then increase to two 30 mg tablets ( total 60mg ) by mouth once daily.    Dispense:  60 capsule    Refill:  3    Order Specific Question:   Supervising Provider    Answer:   Deborra Medina L [[id number]]  . triamcinolone (KENALOG) 0.025 % cream    Sig: Apply 1 application topically 2 (two) times daily.    Dispense:  80 g    Refill:  1    Order Specific Question:   Supervising Provider  Answer:   Crecencio Mc [[id number]]    Return precautions given.   Risks, benefits, and alternatives of the medications and treatment plan prescribed today were discussed, and patient expressed understanding.   Education regarding symptom management and diagnosis given to patient on AVS.  Continue to follow with Burnard Hawthorne, FNP for routine health maintenance.   Anne Ng and I agreed with plan.   Mable Paris, FNP

## 2016-10-25 NOTE — Patient Instructions (Signed)
Pleasure always seeing you.  Use cream if rash reappears on foot.   Referral to dr Sharlet Salina  Trial cymbalta  F/u 6-8 weeks

## 2016-10-25 NOTE — Assessment & Plan Note (Signed)
No rash today to evaluate. Symptoms of dry scaly, itchy support either dry skin or eczema. Giving patient trial of topical corticosteroid. Patient will let me know if not better.

## 2016-10-26 ENCOUNTER — Other Ambulatory Visit: Payer: Self-pay

## 2016-10-26 ENCOUNTER — Encounter: Payer: Self-pay | Admitting: Family

## 2016-10-26 DIAGNOSIS — R21 Rash and other nonspecific skin eruption: Secondary | ICD-10-CM

## 2016-10-26 DIAGNOSIS — M5442 Lumbago with sciatica, left side: Secondary | ICD-10-CM

## 2016-10-26 MED ORDER — TRIAMCINOLONE ACETONIDE 0.025 % EX CREA: 1.0000 "application " | TOPICAL_CREAM | Freq: Two times a day (BID) | CUTANEOUS | 1 refills | Status: DC

## 2016-10-26 MED ORDER — DULOXETINE HCL 30 MG PO CPEP: ORAL_CAPSULE | ORAL | 3 refills | Status: DC

## 2016-10-26 NOTE — Telephone Encounter (Signed)
RX has been resent to correct pharmacy.

## 2016-11-06 ENCOUNTER — Ambulatory Visit: Payer: Self-pay | Admitting: Family

## 2016-11-26 ENCOUNTER — Encounter: Payer: Self-pay | Admitting: Family

## 2016-12-13 ENCOUNTER — Ambulatory Visit: Payer: Self-pay | Admitting: Family

## 2016-12-14 ENCOUNTER — Other Ambulatory Visit: Payer: Self-pay

## 2016-12-14 ENCOUNTER — Ambulatory Visit: Payer: 59 | Attending: Family

## 2016-12-25 ENCOUNTER — Encounter: Payer: Self-pay | Admitting: Family

## 2016-12-26 NOTE — Telephone Encounter (Signed)
fmla completed
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
## 2017-01-08 ENCOUNTER — Encounter: Payer: Self-pay | Admitting: Family

## 2017-01-14 ENCOUNTER — Encounter: Payer: Self-pay | Admitting: Family

## 2017-01-15 NOTE — Telephone Encounter (Signed)
Copied from [place] [id number]. Topic: Quick Communication - See Telephone Encounter >> Jan 15, 2017  2:29 PM Robina Ade, Helene Kelp D wrote: CRM for notification. See Telephone encounter for: 01/15/17. Patient is calling regarding paperwork she faxed over. She wanted to know the status of them. Please call patient back, thanks.

## 2017-03-15 DIAGNOSIS — Z029 Encounter for administrative examinations, unspecified: Secondary | ICD-10-CM

## 2017-03-31 ENCOUNTER — Other Ambulatory Visit: Payer: Self-pay | Admitting: Family

## 2017-04-19 ENCOUNTER — Other Ambulatory Visit: Payer: Self-pay | Admitting: Family

## 2017-04-19 DIAGNOSIS — M5442 Lumbago with sciatica, left side: Secondary | ICD-10-CM

## 2017-06-10 ENCOUNTER — Ambulatory Visit: Payer: 59 | Admitting: Family

## 2017-06-10 ENCOUNTER — Encounter: Payer: Self-pay | Admitting: Family

## 2017-06-10 VITALS — BP 114/82 | HR 95 | Temp 99.0°F | Wt 230.0 lb

## 2017-06-10 DIAGNOSIS — I10 Essential (primary) hypertension: Secondary | ICD-10-CM

## 2017-06-10 DIAGNOSIS — N6320 Unspecified lump in the left breast, unspecified quadrant: Secondary | ICD-10-CM

## 2017-06-10 DIAGNOSIS — M545 Low back pain: Secondary | ICD-10-CM | POA: Diagnosis not present

## 2017-06-10 DIAGNOSIS — Z1239 Encounter for other screening for malignant neoplasm of breast: Secondary | ICD-10-CM

## 2017-06-10 DIAGNOSIS — Z1231 Encounter for screening mammogram for malignant neoplasm of breast: Secondary | ICD-10-CM | POA: Diagnosis not present

## 2017-06-10 DIAGNOSIS — G8929 Other chronic pain: Secondary | ICD-10-CM

## 2017-06-10 MED ORDER — GABAPENTIN 100 MG PO CAPS: 300.0000 mg | ORAL_CAPSULE | Freq: Every day | ORAL | 1 refills | Status: DC

## 2017-06-10 NOTE — Assessment & Plan Note (Signed)
Pending Left DIAG and annual mammogram. We scheduled for patient today.

## 2017-06-10 NOTE — Patient Instructions (Signed)
Pleasure seeing you  Ensure you have diagnostic mammogram AND regular screening mammogram  Pap is due this year

## 2017-06-10 NOTE — Assessment & Plan Note (Signed)
At goal. Continue current regimen. 

## 2017-06-10 NOTE — Progress Notes (Signed)
Subjective:    Patient ID: Sandra Gray, female    DOB: 08/24/1970, 47 y.o.   MRN: [id number]  CC: Sandra Gray is a 47 y.o. female who presents today for follow up.   HPI: Low back pain- improved. taking gabapentin at bedtime and helps pain. Needs refill.  hasnt needed muscle relaxant.  Has been to [facility] for acupuncture, with relief. Not using cane. Tens helps too.  Has started going to gym for stationary bike.  Needs FMLA paperwork completed again for [employer].   Due for mammogram  Follows with OB GYN  HTN- compliant with medication. Denies exertional chest pain or pressure, numbness or tingling radiating to left arm or jaw, palpitations, dizziness, frequent headaches, changes in vision, or shortness of breath.     HISTORY:  Past Medical History:  Diagnosis Date  . Asthma 2008  . Bronchitis   . Chronic kidney disease    "declining kidney, but it's stable right now"  . Fibroids   . Hypertension 2013   Started on lisinopril  . Pneumonia    Past Surgical History:  Procedure Laterality Date  . DILATION AND CURETTAGE OF UTERUS  08/2011  . HYSTEROSCOPY  02/15/2012   Fibroid Removal  . HYSTEROSCOPY  may 2014  . ROBOTIC ASSISTED SALPINGO OOPHERECTOMY Bilateral 07/28/2015   Procedure: XI ROBOTIC ASSISTED BILATERAL SALPINGO OOPHORECTOMY AND LYSIS OF ADHESIONS;  Surgeon: Everitt Amber, MD;  Location: [facility];  Service: Gynecology;  Laterality: Bilateral;  . SUPRACERVICAL ABDOMINAL HYSTERECTOMY N/A 08/31/2014   Procedure: HYSTERECTOMY SUPRACERVICAL ABDOMINAL;  Surgeon: [name], MD;  Location: [facility];  Service: Gynecology;  Laterality: N/A;  Dr. Elonda Husky will assist   Family History  Problem Relation Age of Onset  . Hypertension Mother   . Hypertension Father   . Diabetes Father   . Kidney disease Maternal Grandmother   . Cancer Maternal Grandfather        Lung - Asbestos exposure  . Diabetes Paternal Grandmother   . Heart disease Paternal Grandfather   . Breast  cancer Neg Hx     Allergies: Gadolinium derivatives Current Outpatient Medications on File Prior to Visit  Medication Sig Dispense Refill  . albuterol (PROVENTIL HFA;VENTOLIN HFA) 108 (90 Base) MCG/ACT inhaler Inhale 2 puffs into the lungs every 6 (six) hours as needed for wheezing. 1 Inhaler 2  . albuterol (PROVENTIL) (2.5 MG/3ML) 0.083% nebulizer solution Take 3 mLs (2.5 mg total) by nebulization every 6 (six) hours as needed for wheezing or shortness of breath. 150 mL 1  . amLODipine (NORVASC) 5 MG tablet Take 1 tablet by mouth  daily 90 tablet 3  . cyclobenzaprine (FLEXERIL) 10 MG tablet Take 1 tablet (10 mg total) by mouth 3 (three) times daily as needed for muscle spasms. 15 tablet 0  . fluticasone (FLOVENT HFA) 110 MCG/ACT inhaler Inhale 1 puff into the lungs 2 (two) times daily. 1 Inhaler 0  . lidocaine (LIDODERM) 5 % UNWRAP AND APPLY 1 PATCH TO SKIN DAILY, REMOVE AND DISCARD PATCH WITHIN 12 HOURS 30 patch 1  . lisinopril (PRINIVIL,ZESTRIL) 5 MG tablet TAKE 1 TABLET BY MOUTH  DAILY 90 tablet 1  . omega-3 acid ethyl esters (LOVAZA) 1 g capsule Take by mouth 2 (two) times daily.    Marland Kitchen triamcinolone (KENALOG) 0.025 % cream Apply 1 application topically 2 (two) times daily. 80 g 1  . Turmeric 500 MG TABS Take by mouth daily.    . norethindrone (AYGESTIN) 5 MG tablet TAKE 1 TABLET BY  MOUTH  DAILY (Patient not taking: Reported on 06/10/2017) 90 tablet 1  . tobramycin (TOBREX) 0.3 % ophthalmic solution Place 1 drop into the right eye every 4 (four) hours while awake. (Patient not taking: Reported on 06/10/2017) 5 mL 0   No current facility-administered medications on file prior to visit.     Social History   Tobacco Use  . Smoking status: Never Smoker  . Smokeless tobacco: Never Used  Substance Use Topics  . Alcohol use: Yes    Alcohol/week: 0.6 oz    Types: 1 Standard drinks or equivalent per week    Comment: Rarely  . Drug use: No    Review of Systems  Constitutional: Negative for  chills and fever.  Respiratory: Negative for cough.   Cardiovascular: Negative for chest pain and palpitations.  Gastrointestinal: Negative for nausea and vomiting.  Musculoskeletal: Positive for back pain (chronic).      Objective:    BP 114/82 (BP Location: Left Arm, Patient Position: Sitting, Cuff Size: Large)   Pulse 95   Temp 99 F (37.2 C) (Oral)   Wt 230 lb (104.3 kg)   LMP 08/02/2014   SpO2 97%   BMI 42.07 kg/m  BP Readings from Last 3 Encounters:  06/10/17 114/82  10/25/16 132/80  09/24/16 136/78   Wt Readings from Last 3 Encounters:  06/10/17 230 lb (104.3 kg)  10/25/16 228 lb 9.6 oz (103.7 kg)  09/24/16 228 lb 3.2 oz (103.5 kg)    Physical Exam  Constitutional: She appears well-developed and well-nourished.  Eyes: Conjunctivae are normal.  Cardiovascular: Normal rate, regular rhythm, normal heart sounds and normal pulses.  Pulmonary/Chest: Effort normal and breath sounds normal. She has no wheezes. She has no rhonchi. She has no rales.  Neurological: She is alert.  Skin: Skin is warm and dry.  Psychiatric: She has a normal mood and affect. Her speech is normal and behavior is normal. Thought content normal.  Vitals reviewed.      Assessment & Plan:   Problem List Items Addressed This Visit      Cardiovascular and Mediastinum   Hypertension (Chronic)    At goal. Continue current regimen.       Relevant Medications   omega-3 acid ethyl esters (LOVAZA) 1 g capsule     Other   Screening for breast cancer    Pending Left DIAG and annual mammogram. We scheduled for patient today.       Chronic low back pain - Primary    Very happy that back pain has improved.  Patient is starting to exercise again and no longer using cane.  Will continue current therapy with gabapentin at bedtime.  Will follow.  FMLA forms completed today      Relevant Medications   gabapentin (NEURONTIN) 100 MG capsule    Other Visit Diagnoses    Left breast mass       Relevant  Orders   MM Digital Diagnostic Unilat L   MM DIGITAL SCREENING BILATERAL       I have discontinued Joelene Millin Beckers's DULoxetine. I have also changed her gabapentin. Additionally, I am having her maintain her albuterol, albuterol, fluticasone, cyclobenzaprine, tobramycin, amLODipine, triamcinolone, norethindrone, lisinopril, lidocaine, omega-3 acid ethyl esters, and Turmeric.   Meds ordered this encounter  Medications  . gabapentin (NEURONTIN) 100 MG capsule    Sig: Take 3 capsules (300 mg total) by mouth at bedtime.    Dispense:  90 capsule    Refill:  1  Order Specific Question:   Supervising Provider    Answer:   Crecencio Mc [[id number]]    Return precautions given.   Risks, benefits, and alternatives of the medications and treatment plan prescribed today were discussed, and patient expressed understanding.   Education regarding symptom management and diagnosis given to patient on AVS.  Continue to follow with Burnard Hawthorne, FNP for routine health maintenance.   Anne Ng and I agreed with plan.   Mable Paris, FNP

## 2017-06-10 NOTE — Assessment & Plan Note (Signed)
Very happy that back pain has improved.  Patient is starting to exercise again and no longer using cane.  Will continue current therapy with gabapentin at bedtime.  Will follow.  FMLA forms completed today

## 2017-06-11 ENCOUNTER — Telehealth: Payer: Self-pay

## 2017-06-11 NOTE — Telephone Encounter (Signed)
Copied from [place] [id number]. Topic: Inquiry >> Jun 11, 2017  7:32 AM Pricilla Handler wrote: Reason for CRM: Patient called requesting a return call from [place]. Patient would like to speak with Lorriane Shire this morning. Patient would not elaborate as to why she wants Lorriane Shire to call.       Thank You!!!

## 2017-06-11 NOTE — Telephone Encounter (Signed)
I spoke with the patient she will be back into the office to have forms filled out .

## 2017-06-14 ENCOUNTER — Telehealth: Payer: Self-pay

## 2017-06-14 DIAGNOSIS — R928 Other abnormal and inconclusive findings on diagnostic imaging of breast: Secondary | ICD-10-CM

## 2017-06-17 NOTE — Telephone Encounter (Signed)
Corrected orders

## 2017-06-18 ENCOUNTER — Telehealth: Payer: Self-pay

## 2017-06-18 NOTE — Telephone Encounter (Signed)
Appointment scheduled for mammogram 07/05/17@11 :00am . Left message for patient to call

## 2017-06-28 ENCOUNTER — Telehealth: Payer: Self-pay

## 2017-06-28 NOTE — Telephone Encounter (Signed)
Copied from [place] [id number]. Topic: Quick Communication - Office Called Patient >> Jun 28, 2017  8:55 AM Synthia Innocent wrote: Office called patient

## 2017-06-28 NOTE — Telephone Encounter (Signed)
LMTCB

## 2017-07-01 NOTE — Telephone Encounter (Signed)
Patient advised of appointment for mammogram and disability paperwork complete

## 2017-07-05 ENCOUNTER — Other Ambulatory Visit: Payer: Self-pay

## 2017-07-19 ENCOUNTER — Other Ambulatory Visit: Payer: Self-pay

## 2017-07-24 ENCOUNTER — Other Ambulatory Visit: Payer: Self-pay | Admitting: Family

## 2017-07-24 ENCOUNTER — Encounter: Payer: Self-pay | Admitting: Family

## 2017-07-24 DIAGNOSIS — G8929 Other chronic pain: Secondary | ICD-10-CM

## 2017-07-24 DIAGNOSIS — M545 Low back pain: Principal | ICD-10-CM

## 2017-07-24 MED ORDER — CYCLOBENZAPRINE HCL 5 MG PO TABS: 5.0000 mg | ORAL_TABLET | Freq: Every evening | ORAL | 0 refills | Status: DC | PRN

## 2017-07-24 NOTE — Telephone Encounter (Signed)
Left message to call.

## 2017-07-24 NOTE — Telephone Encounter (Signed)
Called patient back , advised she would need appointment with MD.  I offered appointment with NP tomorrow she states she is unable to get off work midday. She needs late pm appointment . She prefers only to see you since you have been dealing with her issues.  You only have hospital follow up appointment open on Friday at 11:30 pm. Please advise.
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
## 2017-08-09 ENCOUNTER — Ambulatory Visit
Admission: RE | Admit: 2017-08-09 | Discharge: 2017-08-09 | Disposition: A | Discharge status: Home / Self Care | Payer: 59 | Source: Ambulatory Visit | Attending: Family | Admitting: Family

## 2017-08-09 DIAGNOSIS — R928 Other abnormal and inconclusive findings on diagnostic imaging of breast: Secondary | ICD-10-CM | POA: Diagnosis present

## 2017-08-09 DIAGNOSIS — N6320 Unspecified lump in the left breast, unspecified quadrant: Secondary | ICD-10-CM | POA: Diagnosis not present

## 2017-09-05 ENCOUNTER — Other Ambulatory Visit: Payer: Self-pay | Admitting: Family

## 2017-09-08 ENCOUNTER — Encounter: Payer: Self-pay | Admitting: Family

## 2017-09-08 ENCOUNTER — Other Ambulatory Visit: Payer: Self-pay | Admitting: Family

## 2017-09-08 DIAGNOSIS — M545 Low back pain: Principal | ICD-10-CM

## 2017-09-08 DIAGNOSIS — G8929 Other chronic pain: Secondary | ICD-10-CM

## 2017-09-09 ENCOUNTER — Other Ambulatory Visit: Payer: Self-pay | Admitting: Family

## 2017-09-09 DIAGNOSIS — M545 Low back pain: Principal | ICD-10-CM

## 2017-09-09 DIAGNOSIS — G8929 Other chronic pain: Secondary | ICD-10-CM

## 2017-09-09 NOTE — Progress Notes (Signed)
close

## 2017-09-27 ENCOUNTER — Ambulatory Visit: Payer: 59 | Admitting: Family

## 2017-10-09 ENCOUNTER — Ambulatory Visit: Payer: Self-pay | Admitting: Family

## 2017-10-09 ENCOUNTER — Ambulatory Visit: Payer: 59 | Admitting: Family

## 2017-10-11 ENCOUNTER — Ambulatory Visit: Payer: 59 | Admitting: Family

## 2017-10-11 ENCOUNTER — Telehealth: Payer: Self-pay | Admitting: Family

## 2017-10-11 VITALS — BP 128/84 | HR 108 | Temp 98.6°F | Ht 62.0 in | Wt 233.4 lb

## 2017-10-11 DIAGNOSIS — G8929 Other chronic pain: Secondary | ICD-10-CM

## 2017-10-11 DIAGNOSIS — M545 Low back pain: Secondary | ICD-10-CM | POA: Diagnosis not present

## 2017-10-11 DIAGNOSIS — E118 Type 2 diabetes mellitus with unspecified complications: Secondary | ICD-10-CM

## 2017-10-11 DIAGNOSIS — E785 Hyperlipidemia, unspecified: Secondary | ICD-10-CM

## 2017-10-11 DIAGNOSIS — I10 Essential (primary) hypertension: Secondary | ICD-10-CM | POA: Diagnosis not present

## 2017-10-11 MED ORDER — ROSUVASTATIN CALCIUM 5 MG PO TABS: 5.0000 mg | ORAL_TABLET | Freq: Every day | ORAL | 3 refills | Status: DC

## 2017-10-11 MED ORDER — METFORMIN HCL ER 500 MG PO TB24: ORAL_TABLET | ORAL | 3 refills | Status: DC

## 2017-10-11 MED ORDER — DULOXETINE HCL 30 MG PO CPEP: ORAL_CAPSULE | ORAL | 3 refills | Status: DC

## 2017-10-11 NOTE — Assessment & Plan Note (Signed)
Chronic, worsening.  Will trial Cymbalta.  Referral to pain management.  She is on 300 mg gabapentin at bedtime.  She has not noticed a big difference on this medication and since there is potential for side effect with Cymbalta and gabapentin, we will discuss coming off the gabapentin at follow-up.  Close f/u

## 2017-10-11 NOTE — Assessment & Plan Note (Signed)
New.  Will start metformin.  She politely declines nutrition referral at this time.  Will follow a1c  in 3 months
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
## 2017-10-11 NOTE — Assessment & Plan Note (Signed)
CVD risk 5.4%. LDL > 70. Start statin

## 2017-10-11 NOTE — Telephone Encounter (Signed)
Copied from [place] [id number]. Topic: Quick Communication - See Telephone Encounter >> Oct 11, 2017 12:02 PM Mylinda Latina, NT wrote: CRM for notification. See Telephone encounter for: 10/11/17. Patient called  states that the RX DULoxetine (CYMBALTA) 30 MG capsule ,metFORMIN (GLUCOPHAGE XR) 500 MG 24 hr tablet ,rosuvastatin (CRESTOR) 5 MG tablet can't be filled by [facility]. Can it be resent to   [facility] [id number] - [place], [place] - [place] [phone] (Phone) [phone] (Fax)

## 2017-10-11 NOTE — Telephone Encounter (Signed)
Medications resent to [facility] on [address] in [place], [place] as requested.

## 2017-10-11 NOTE — Patient Instructions (Addendum)
Start metformin, cymbalta, crestor  Im thinking of you.

## 2017-10-11 NOTE — Assessment & Plan Note (Signed)
Blood pressure well controlled.  Will continue current regimen.

## 2017-10-18 ENCOUNTER — Ambulatory Visit: Payer: 59 | Admitting: Family

## 2017-10-18 ENCOUNTER — Encounter: Payer: Self-pay | Admitting: Family

## 2017-10-18 ENCOUNTER — Ambulatory Visit (INDEPENDENT_AMBULATORY_CARE_PROVIDER_SITE_OTHER): Payer: 59

## 2017-10-18 VITALS — BP 118/90 | HR 108 | Temp 98.1°F | Resp 15 | Ht 62.0 in | Wt 226.1 lb

## 2017-10-18 DIAGNOSIS — G8929 Other chronic pain: Secondary | ICD-10-CM | POA: Diagnosis not present

## 2017-10-18 DIAGNOSIS — M545 Low back pain: Secondary | ICD-10-CM | POA: Diagnosis not present

## 2017-10-18 DIAGNOSIS — E118 Type 2 diabetes mellitus with unspecified complications: Secondary | ICD-10-CM

## 2017-10-18 DIAGNOSIS — M79641 Pain in right hand: Secondary | ICD-10-CM

## 2017-10-18 DIAGNOSIS — Z23 Encounter for immunization: Secondary | ICD-10-CM

## 2017-10-18 DIAGNOSIS — M25511 Pain in right shoulder: Secondary | ICD-10-CM

## 2017-10-18 DIAGNOSIS — M19011 Primary osteoarthritis, right shoulder: Secondary | ICD-10-CM | POA: Diagnosis not present

## 2017-10-18 NOTE — Assessment & Plan Note (Addendum)
Chronic.  Suspect OA.  Pending x-ray, rheumatoid serologies.  Will follow

## 2017-10-18 NOTE — Assessment & Plan Note (Signed)
Suspect improvement with lifestyle changes, weight loss. a1c 09/2017 8.  Will repeat A1c in 3 months

## 2017-10-18 NOTE — Patient Instructions (Addendum)
Please return in November for a1c for blood sugar.   congrats on weight loss!!!    This is  Dr. Lupita Dawn  example of a  "Low GI"  Diet:  It will allow you to lose 4 to 8  lbs  per month if you follow it carefully.  Your goal with   Please have Dr Sharlet Salina also look at Functional Assessment as well- he will be better qualitfied at this.  Today we discussed referrals, orders.Dr Sharlet Salina   I have placed these orders in the system for you.  Please be sure to give [place] a call if you have not heard from our office regarding scheduling a test or regarding referral in a timely manner.  It is very important that you let me know as soon as possible.    exercise is a minimum of 30 minutes of aerobic exercise 5 days per week (Walking does not count once it becomes easy!)    All of the foods can be found at grocery stores and in bulk at [employer].  The Atkins protein bars and shakes are available in more varieties at [employer], [employer] and [place].     7 AM Breakfast:  Choose from the following:  Low carbohydrate Protein  Shakes (I recommend the  Premier Protein chocolate shakes,  EAS AdvantEdge "Carb Control" shakes  Or the Atkins shakes all are under 3 net carbs)     a scrambled egg/bacon/cheese burrito made with Mission's "carb balance" whole wheat tortilla  (about 10 net carbs )  Regulatory affairs officer (basically a quiche without the pastry crust) that is eaten cold and very convenient way to get your eggs.  8 carbs)  If you make your own protein shakes, avoid bananas and pineapple,  And use low carb greek yogurt or original /unsweetened almond or soy milk    Avoid cereal and bananas, oatmeal and cream of wheat and grits. They are loaded with carbohydrates!   10 AM: high protein snack:  Protein bar by Atkins (the snack size, under 200 cal, usually < 6 net carbs).    A stick of cheese:  Around 1 carb,  100 cal     Dannon Light n Fit [place] Yogurt  (80 cal, 8 carbs)  Other so called  "protein bars" and Greek yogurts tend to be loaded with carbohydrates.  Remember, in food advertising, the word "energy" is synonymous for " carbohydrate."  Lunch:   A Sandwich using the bread choices listed, Can use any  Eggs,  lunchmeat, grilled meat or canned tuna), avocado, regular mayo/mustard  and cheese.  A Salad using blue cheese, ranch,  Goddess or vinagrette,  Avoid taco shells, croutons or "confetti" and no "candied nuts" but regular nuts OK.   No pretzels, nabs  or chips.  Pickles and miniature sweet peppers are a good low carb alternative that provide a "crunch"  The bread is the only source of carbohydrate in a sandwich and  can be decreased by trying some of the attached alternatives to traditional loaf bread   Avoid "Low fat dressings, as well as Anderson dressings They are loaded with sugar!   3 PM/ Mid day  Snack:  Consider  1 ounce of  almonds, walnuts, pistachios, pecans, peanuts,  Macadamia nuts or a nut medley.  Avoid "granola and granola bars "  Mixed nuts are ok in moderation as long as there are no raisins,  cranberries or dried fruit.  KIND bars are OK if you get the low glycemic index variety   Try the prosciutto/mozzarella cheese sticks by Fior[employer]ci  In deli /backery section   High protein      6 PM  Dinner:     Meat/fowl/fish with a green salad, and either broccoli, cauliflower, green beans, spinach, brussel sprouts or  Lima beans. DO NOT BREAD THE PROTEIN!!      There is a low carb pasta by Dreamfield's that is acceptable and tastes great: only 5 digestible carbs/serving.( All grocery stores but BJs carry it ) Several ready made meals are available low carb:   Try Michel Angelo's chicken piccata or chicken or eggplant parm over low carb pasta.(Lowes and BJs)   Marjory Lies Sanchez's "Carnitas" (pulled pork, no sauce,  0 carbs) or his beef pot roast to make a dinner burrito (at [employer]J's)  Pesto over low carb pasta (bj's sells a good quality pesto in the  center refrigerated section of the deli   Try satueeing  Cheral Marker with mushroooms as a good side   [employer] Giant makes a mashed cauliflower that tastes like mashed potatoes  Whole wheat pasta is still full of digestible carbs and  Not as low in glycemic index as Dreamfield's.   Brown rice is still rice,  So skip the rice and noodles if you eat [place] or [place] (or at least limit to 1/2 cup)  9 PM snack :   Breyer's "low carb" fudgsicle or  ice cream bar (Carb Smart line), or  Weight Watcher's ice cream bar , or another "no sugar added" ice cream;  a serving of fresh berries/cherries with whipped cream   Cheese or DANNON'S LlGHT N FIT GREEK YOGURT  8 ounces of [place] unsweetened almond/cococunut milk    Treat yourself to a parfait made with whipped cream blueberiies, walnuts and vanilla greek yogurt  Avoid bananas, pineapple, grapes  and watermelon on a regular basis because they are high in sugar.  THINK OF THEM AS DESSERT  Remember that snack Substitutions should be less than 10 NET carbs per serving and meals < 20 carbs. Remember to subtract fiber grams to get the "net carbs."  @TULLOBREADPACKAGE @

## 2017-10-18 NOTE — Assessment & Plan Note (Addendum)
Chronic, unchanged.  Considering mild impingement syndrome.  Pending x-ray. Will follow

## 2017-10-18 NOTE — Assessment & Plan Note (Addendum)
Some improvement on Cymbalta.  Will continue. Referral to [facility]; awaiting also on appointment with Pain Management.  Completed functional limitation form for [employer] however advised patient this to be better performed under pain management, physical medicine.  She is aware of this.

## 2017-10-18 NOTE — Progress Notes (Signed)
Subjective:    Patient ID: Sandra Gray, female    DOB: 1971-01-26, 47 y.o.   MRN: [id number]  CC: Sandra Gray is a 47 y.o. female who presents today for follow up.   HPI: Here today as would like forms completed for [employer]   Started trial Cymbalta last week and is noticed some improvement in low back pain, mood. She plans to Increase medication today as prescribed.  Complains of right hand MCP pain across all 5 MCP joints.  No swelling, erythema, increased warmth.  No fever.  Describes as a "soreness".  No numbness tingling.  Right shoulder pain-chronic per the year.  Pain with lifting, raising arm.  No numbness tingling.  Diabetes-compliant with metformin.  Focusing on weight loss, has stopped sodas.       XR 2017 right hand normal HISTORY:  Past Medical History:  Diagnosis Date  . Asthma 2008  . Bronchitis   . Chronic kidney disease    "declining kidney, but it's stable right now"  . Fibroids   . Hypertension 2013   Started on lisinopril  . Pneumonia    Past Surgical History:  Procedure Laterality Date  . DILATION AND CURETTAGE OF UTERUS  08/2011  . HYSTEROSCOPY  02/15/2012   Fibroid Removal  . HYSTEROSCOPY  may 2014  . ROBOTIC ASSISTED SALPINGO OOPHERECTOMY Bilateral 07/28/2015   Procedure: XI ROBOTIC ASSISTED BILATERAL SALPINGO OOPHORECTOMY AND LYSIS OF ADHESIONS;  Surgeon: Everitt Amber, MD;  Location: [facility];  Service: Gynecology;  Laterality: Bilateral;  . SUPRACERVICAL ABDOMINAL HYSTERECTOMY N/A 08/31/2014   Procedure: HYSTERECTOMY SUPRACERVICAL ABDOMINAL;  Surgeon: [name], MD;  Location: [facility];  Service: Gynecology;  Laterality: N/A;  Dr. Elonda Husky will assist   Family History  Problem Relation Age of Onset  . Hypertension Mother   . Hypertension Father   . Diabetes Father   . Kidney disease Maternal Grandmother   . Cancer Maternal Grandfather        Lung - Asbestos exposure  . Diabetes Paternal Grandmother   . Heart disease Paternal  Grandfather   . Breast cancer Neg Hx     Allergies: Gadolinium derivatives Current Outpatient Medications on File Prior to Visit  Medication Sig Dispense Refill  . albuterol (PROVENTIL HFA;VENTOLIN HFA) 108 (90 Base) MCG/ACT inhaler Inhale 2 puffs into the lungs every 6 (six) hours as needed for wheezing. 1 Inhaler 2  . albuterol (PROVENTIL) (2.5 MG/3ML) 0.083% nebulizer solution Take 3 mLs (2.5 mg total) by nebulization every 6 (six) hours as needed for wheezing or shortness of breath. 150 mL 1  . amLODipine (NORVASC) 5 MG tablet TAKE 1 TABLET BY MOUTH  DAILY 90 tablet 1  . cyclobenzaprine (FLEXERIL) 5 MG tablet Take 1 tablet (5 mg total) by mouth at bedtime as needed for muscle spasms. 90 tablet 0  . DULoxetine (CYMBALTA) 30 MG capsule Take one 30 mg tablet by mouth once a day for the first week. Then increase to two 30 mg tablets ( total 60mg ) by mouth once daily. 60 capsule 3  . fluticasone (FLOVENT HFA) 110 MCG/ACT inhaler Inhale 1 puff into the lungs 2 (two) times daily. 1 Inhaler 0  . gabapentin (NEURONTIN) 100 MG capsule TAKE 3 CAPSULES BY MOUTH AT BEDTIME 180 capsule 1  . lidocaine (LIDODERM) 5 % UNWRAP AND APPLY 1 PATCH TO SKIN DAILY, REMOVE AND DISCARD PATCH WITHIN 12 HOURS 30 patch 1  . lisinopril (PRINIVIL,ZESTRIL) 5 MG tablet TAKE 1 TABLET BY MOUTH  DAILY 90  tablet 0  . metFORMIN (GLUCOPHAGE XR) 500 MG 24 hr tablet Start 500mg  PO qpm. 90 tablet 3  . norethindrone (AYGESTIN) 5 MG tablet TAKE 1 TABLET BY MOUTH  DAILY 90 tablet 1  . omega-3 acid ethyl esters (LOVAZA) 1 g capsule Take by mouth 2 (two) times daily.    . rosuvastatin (CRESTOR) 5 MG tablet Take 1 tablet (5 mg total) by mouth daily. 90 tablet 3  . triamcinolone (KENALOG) 0.025 % cream Apply 1 application topically 2 (two) times daily. 80 g 1  . Turmeric 500 MG TABS Take by mouth daily.     No current facility-administered medications on file prior to visit.     Social History   Tobacco Use  . Smoking status: Never  Smoker  . Smokeless tobacco: Never Used  Substance Use Topics  . Alcohol use: Yes    Alcohol/week: 1.0 standard drinks    Types: 1 Standard drinks or equivalent per week    Comment: Rarely  . Drug use: No    Review of Systems  Constitutional: Negative for chills and fever.  Respiratory: Negative for cough.   Cardiovascular: Negative for chest pain and palpitations.  Gastrointestinal: Negative for nausea and vomiting.  Musculoskeletal: Positive for arthralgias and back pain.  Neurological: Negative for dizziness and numbness.      Objective:    BP 118/90 (BP Location: Left Arm, Patient Position: Sitting, Cuff Size: Large)   Pulse (!) 108   Temp 98.1 F (36.7 C) (Oral)   Resp 15   Ht 5\' 2"  (1.575 m)   Wt 226 lb 2 oz (102.6 kg)   LMP 08/02/2014   SpO2 98%   BMI 41.36 kg/m  BP Readings from Last 3 Encounters:  10/18/17 118/90  10/11/17 128/84  06/10/17 114/82   Wt Readings from Last 3 Encounters:  10/18/17 226 lb 2 oz (102.6 kg)  10/11/17 233 lb 6 oz (105.9 kg)  06/10/17 230 lb (104.3 kg)    Physical Exam  Constitutional: She appears well-developed and well-nourished.  Eyes: Conjunctivae are normal.  Cardiovascular: Normal rate, regular rhythm, normal heart sounds and normal pulses.  Pulmonary/Chest: Effort normal and breath sounds normal. She has no wheezes. She has no rhonchi. She has no rales.  Musculoskeletal:       Right shoulder: She exhibits tenderness, pain and spasm. She exhibits normal range of motion, no bony tenderness and no swelling.       Right wrist: She exhibits normal range of motion, no tenderness and no bony tenderness.       Arms:      Right hand: She exhibits normal range of motion, no tenderness, no bony tenderness, no laceration and no swelling. Normal sensation noted. Normal strength noted.       Hands: Tenderness over right trapezius.  No gross abnormality. Right Shoulder:   No asymmetry of shoulders when comparing right and left.No pain  with palpation over glenohumeral joint lines, Palisade joint, AC joint, or bicipital groove. No pain with internal and external rotation. Mild pain with resisted lateral extension .    Strength and sensation normal BUE's.  Right hand: Patient reported tenderness as marked on diagram; not elicited on exam over MCP. No pain, edema, erythema over DIP, PIP   Neurological: She is alert.  Skin: Skin is warm and dry.  Psychiatric: She has a normal mood and affect. Her speech is normal and behavior is normal. Thought content normal.  Vitals reviewed.      Assessment &  Plan:   Problem List Items Addressed This Visit      Endocrine   Type 2 diabetes mellitus with complication, without long-term current use of insulin ([place])    Suspect improvement with lifestyle changes, weight loss. a1c 09/2017 8.  Will repeat A1c in 3 months         Other   Chronic low back pain - Primary    Some improvement on Cymbalta.  Will continue. Referral to [facility]; awaiting also on appointment with Pain Management.  Completed functional limitation form for [employer] however advised patient this to be better performed under pain management, physical medicine.  She is aware of this.      Relevant Orders   Ambulatory referral to Orthopedic Surgery   DG Hand Complete Right   ANA   C-reactive protein   CYCLIC CITRUL PEPTIDE ANTIBODY, IGG/IGA   Rheumatoid factor   Sedimentation rate   Chronic right shoulder pain    Chronic, unchanged.  Considering mild impingement syndrome.  Pending x-ray. Will follow      Relevant Orders   DG Shoulder Right   Right hand pain    Chronic.  Suspect OA.  Pending x-ray, rheumatoid serologies.  Will follow          I am having Anne Ng "Maudie Mercury" maintain her albuterol, albuterol, fluticasone, triamcinolone, lidocaine, omega-3 acid ethyl esters, Turmeric, cyclobenzaprine, lisinopril, norethindrone, gabapentin, amLODipine, metFORMIN, rosuvastatin, and DULoxetine.   No orders  of the defined types were placed in this encounter.   Return precautions given.   Risks, benefits, and alternatives of the medications and treatment plan prescribed today were discussed, and patient expressed understanding.   Education regarding symptom management and diagnosis given to patient on AVS.  Continue to follow with Burnard Hawthorne, FNP for routine health maintenance.   Anne Ng and I agreed with plan.   Mable Paris, FNP

## 2017-10-20 LAB — C-REACTIVE PROTEIN: CRP: 10 mg/L (ref 0–10)

## 2017-10-20 LAB — RHEUMATOID FACTOR

## 2017-10-20 LAB — SEDIMENTATION RATE: Sed Rate: 102 mm/hr — ABNORMAL HIGH (ref 0–32)

## 2017-10-20 LAB — ANA: ANA: NEGATIVE

## 2017-10-20 LAB — CYCLIC CITRUL PEPTIDE ANTIBODY, IGG/IGA: Cyclic Citrullin Peptide Ab: 2 units (ref 0–19)

## 2017-10-23 ENCOUNTER — Telehealth: Payer: Self-pay

## 2017-10-23 NOTE — Telephone Encounter (Signed)
Copied from [place] [id number]. Topic: Referral - Status >> Oct 23, 2017 10:02 AM Sandra Gray wrote: Reason for CRM: [facility] called to state that they will see pt but they cannot do the disability paperwork, contact [phone]

## 2017-10-23 NOTE — Telephone Encounter (Signed)
Noted I already completed fmla

## 2017-10-28 ENCOUNTER — Other Ambulatory Visit: Payer: Self-pay | Admitting: Family

## 2017-10-28 DIAGNOSIS — G8929 Other chronic pain: Secondary | ICD-10-CM

## 2017-10-28 DIAGNOSIS — M545 Low back pain: Principal | ICD-10-CM

## 2017-10-30 DIAGNOSIS — G8929 Other chronic pain: Secondary | ICD-10-CM | POA: Diagnosis not present

## 2017-10-30 DIAGNOSIS — M5441 Lumbago with sciatica, right side: Secondary | ICD-10-CM | POA: Diagnosis not present

## 2017-10-30 DIAGNOSIS — M5442 Lumbago with sciatica, left side: Secondary | ICD-10-CM | POA: Diagnosis not present

## 2017-10-30 DIAGNOSIS — M5136 Other intervertebral disc degeneration, lumbar region: Secondary | ICD-10-CM | POA: Diagnosis not present

## 2017-10-30 DIAGNOSIS — M5416 Radiculopathy, lumbar region: Secondary | ICD-10-CM | POA: Diagnosis not present

## 2017-10-30 DIAGNOSIS — M545 Low back pain: Secondary | ICD-10-CM | POA: Diagnosis not present

## 2017-10-31 ENCOUNTER — Other Ambulatory Visit: Payer: Self-pay | Admitting: Family

## 2017-11-04 ENCOUNTER — Telehealth: Payer: Self-pay

## 2017-11-04 NOTE — Telephone Encounter (Signed)
Copied from [place] [id number] [id number]. Topic: General - Other >> Nov 04, 2017  3:55 PM Sheran Luz wrote: Reason for CRM: Pt is requesting a call back from [name] about paper work. Pt would not disclose anything else. Please advise.  Cb# [phone]

## 2017-11-05 ENCOUNTER — Encounter: Payer: Self-pay | Admitting: Family

## 2017-11-05 NOTE — Telephone Encounter (Signed)
Returned patient's call wanting to know if the physician would fill out her paper work from [employer] regarding physiatry. Physiatry physician's office informed her that specialist do not fill out FMLA paper work.

## 2017-11-05 NOTE — Telephone Encounter (Signed)
Paperwork placed in your folder for completion  

## 2017-11-14 ENCOUNTER — Telehealth: Payer: Self-pay

## 2017-11-14 NOTE — Telephone Encounter (Signed)
Copied from [place] [id number]. Topic: General - Other >> Nov 14, 2017  4:41 PM Sheran Luz wrote: Reason for CRM: Pt requesting call back from Dr. Roseanne Kaufman nurse. Would not disclose anything other than it was about paperwork. Please advise. Spoke with patient she states that [employer] contacted her , and they are requested  Information from Dr [employer] office  , she wants [place] to notate when she previously saw  Dr Annette Stable he  informed her there was a annular tear from MRI , severe lumbar ligamentous tear  in back, muscles and tendons are strained .   Due to her recent fall  Has continuous pain.

## 2017-11-20 ENCOUNTER — Ambulatory Visit: Payer: Self-pay | Admitting: Family

## 2017-11-20 NOTE — Telephone Encounter (Signed)
Call pt  I am not sure what she is requesting here Sounds like she needs to speak with chasnis office regarding release of medical information  I am unable to see notes from Dr Trenton Gammon. I would also advise she ask his office to send over office notes for my review to that I am UTD as well

## 2017-11-28 ENCOUNTER — Encounter: Payer: Self-pay | Admitting: Family

## 2017-11-29 ENCOUNTER — Ambulatory Visit: Payer: 59 | Admitting: Family

## 2017-11-29 ENCOUNTER — Encounter: Payer: Self-pay | Admitting: Family

## 2017-11-29 VITALS — BP 124/84 | HR 110 | Temp 98.8°F | Resp 16 | Ht 62.0 in | Wt 223.0 lb

## 2017-11-29 DIAGNOSIS — J45909 Unspecified asthma, uncomplicated: Secondary | ICD-10-CM | POA: Diagnosis not present

## 2017-11-29 DIAGNOSIS — M545 Low back pain: Secondary | ICD-10-CM

## 2017-11-29 DIAGNOSIS — G8929 Other chronic pain: Secondary | ICD-10-CM | POA: Diagnosis not present

## 2017-11-29 MED ORDER — ALBUTEROL SULFATE HFA 108 (90 BASE) MCG/ACT IN AERS: 2.0000 | INHALATION_SPRAY | Freq: Four times a day (QID) | RESPIRATORY_TRACT | 2 refills | Status: DC | PRN

## 2017-11-29 NOTE — Progress Notes (Signed)
Subjective:    Patient ID: Sandra Gray, female    DOB: 1970/10/15, 47 y.o.   MRN: [id number]  CC: Sandra Gray is a 47 y.o. female who presents today for follow up.   HPI: Here for paperwork for [employer] for Chronic LBP. Needs to be seen monthly.   Having pain today which suspects has elevated HR. No CP.  Currently out of work. Doesn't desire long term disability.  No longer following with Dr Trenton Gammon , neurosurgery, as not a candidate for surgery. Advised to heal and make 'one year.' Doesn't want to pursue any injections.   Using cane today. Using walker at home.   Fell on 10/04/17 and told a muscle spasm after falling down steps. Was hit by bottom on back where 'already had   Would like refill of albuterol for asthma due to season change . No wheezing, SOB   Physical medicaine - 10/30/17 HISTORY:  Past Medical History:  Diagnosis Date  . Asthma 2008  . Bronchitis   . Chronic kidney disease    "declining kidney, but it's stable right now"  . Fibroids   . Hypertension 2013   Started on lisinopril  . Pneumonia    Past Surgical History:  Procedure Laterality Date  . DILATION AND CURETTAGE OF UTERUS  08/2011  . HYSTEROSCOPY  02/15/2012   Fibroid Removal  . HYSTEROSCOPY  may 2014  . ROBOTIC ASSISTED SALPINGO OOPHERECTOMY Bilateral 07/28/2015   Procedure: XI ROBOTIC ASSISTED BILATERAL SALPINGO OOPHORECTOMY AND LYSIS OF ADHESIONS;  Surgeon: Everitt Amber, MD;  Location: [facility];  Service: Gynecology;  Laterality: Bilateral;  . SUPRACERVICAL ABDOMINAL HYSTERECTOMY N/A 08/31/2014   Procedure: HYSTERECTOMY SUPRACERVICAL ABDOMINAL;  Surgeon: [name], MD;  Location: [facility];  Service: Gynecology;  Laterality: N/A;  Dr. Elonda Husky will assist   Family History  Problem Relation Age of Onset  . Hypertension Mother   . Hypertension Father   . Diabetes Father   . Kidney disease Maternal Grandmother   . Cancer Maternal Grandfather        Lung - Asbestos exposure  . Diabetes Paternal  Grandmother   . Heart disease Paternal Grandfather   . Breast cancer Neg Hx     Allergies: Gadolinium derivatives Current Outpatient Medications on File Prior to Visit  Medication Sig Dispense Refill  . albuterol (PROVENTIL) (2.5 MG/3ML) 0.083% nebulizer solution Take 3 mLs (2.5 mg total) by nebulization every 6 (six) hours as needed for wheezing or shortness of breath. 150 mL 1  . amLODipine (NORVASC) 5 MG tablet TAKE 1 TABLET BY MOUTH  DAILY 90 tablet 1  . cyclobenzaprine (FLEXERIL) 5 MG tablet Take 1 tablet (5 mg total) by mouth at bedtime as needed for muscle spasms. 90 tablet 0  . DULoxetine (CYMBALTA) 30 MG capsule Take one 30 mg tablet by mouth once a day for the first week. Then increase to two 30 mg tablets ( total 60mg ) by mouth once daily. 60 capsule 3  . fluticasone (FLOVENT HFA) 110 MCG/ACT inhaler Inhale 1 puff into the lungs 2 (two) times daily. 1 Inhaler 0  . gabapentin (NEURONTIN) 100 MG capsule TAKE 3 CAPSULES BY MOUTH AT BEDTIME 270 capsule 1  . lisinopril (PRINIVIL,ZESTRIL) 5 MG tablet TAKE 1 TABLET BY MOUTH  DAILY 90 tablet 1  . metFORMIN (GLUCOPHAGE XR) 500 MG 24 hr tablet Start 500mg  PO qpm. 90 tablet 3  . norethindrone (AYGESTIN) 5 MG tablet TAKE 1 TABLET BY MOUTH  DAILY 90 tablet 1  .  rosuvastatin (CRESTOR) 5 MG tablet Take 1 tablet (5 mg total) by mouth daily. 90 tablet 3  . triamcinolone (KENALOG) 0.025 % cream Apply 1 application topically 2 (two) times daily. 80 g 1  . lidocaine (LIDODERM) 5 % UNWRAP AND APPLY 1 PATCH TO SKIN DAILY, REMOVE AND DISCARD PATCH WITHIN 12 HOURS (Patient not taking: Reported on 11/29/2017) 30 patch 1  . Turmeric 500 MG TABS Take by mouth daily.     No current facility-administered medications on file prior to visit.     Social History   Tobacco Use  . Smoking status: Never Smoker  . Smokeless tobacco: Never Used  Substance Use Topics  . Alcohol use: Yes    Alcohol/week: 1.0 standard drinks    Types: 1 Standard drinks or  equivalent per week    Comment: Rarely  . Drug use: No    Review of Systems  Constitutional: Negative for chills and fever.  Respiratory: Negative for cough, shortness of breath and wheezing.   Cardiovascular: Negative for chest pain and palpitations.  Gastrointestinal: Negative for nausea and vomiting.  Musculoskeletal: Positive for back pain (chronic).      Objective:    BP 124/84 (BP Location: Left Arm, Patient Position: Sitting, Cuff Size: Large)   Pulse (!) 110   Temp 98.8 F (37.1 C) (Oral)   Resp 16   Ht 5\' 2"  (1.575 m)   Wt 223 lb (101.2 kg)   LMP 08/02/2014   SpO2 98%   BMI 40.79 kg/m  BP Readings from Last 3 Encounters:  11/29/17 124/84  10/18/17 118/90  10/11/17 128/84   Wt Readings from Last 3 Encounters:  11/29/17 223 lb (101.2 kg)  10/18/17 226 lb 2 oz (102.6 kg)  10/11/17 233 lb 6 oz (105.9 kg)    Physical Exam  Constitutional: She appears well-developed and well-nourished.  Eyes: Conjunctivae are normal.  Cardiovascular: Normal rate, regular rhythm, normal heart sounds and normal pulses.  Pulmonary/Chest: Effort normal and breath sounds normal. She has no wheezes. She has no rhonchi. She has no rales.  Neurological: She is alert.  Skin: Skin is warm and dry.  Psychiatric: She has a normal mood and affect. Her speech is normal and behavior is normal. Thought content normal.  Vitals reviewed.      Assessment & Plan:   Problem List Items Addressed This Visit      Respiratory   Asthma with bronchitis   Relevant Medications   albuterol (PROVENTIL HFA;VENTOLIN HFA) 108 (90 Base) MCG/ACT inhaler     Other   Chronic low back pain - Primary    Acute on chronic.  Discussed with patient potential for increasing gabapentin however I am discussing this with pharmacist since patient is also on Cymbalta.  Also discussed that she would benefit from a consult with pain management, referral has been placed.  Completed FMLA paperwork today.  Advised patient I  am happy to see her monthly for close follow-up.  She will let me know if any new or worsening symptoms.      Relevant Orders   Ambulatory referral to Pain Clinic       I have discontinued Anne Ng "Kim"'s omega-3 acid ethyl esters. I am also having her maintain her albuterol, fluticasone, triamcinolone, lidocaine, Turmeric, cyclobenzaprine, norethindrone, amLODipine, metFORMIN, rosuvastatin, DULoxetine, gabapentin, lisinopril, and albuterol.   Meds ordered this encounter  Medications  . albuterol (PROVENTIL HFA;VENTOLIN HFA) 108 (90 Base) MCG/ACT inhaler    Sig: Inhale 2 puffs into the lungs  every 6 (six) hours as needed for wheezing.    Dispense:  1 Inhaler    Refill:  2    Return precautions given.   Risks, benefits, and alternatives of the medications and treatment plan prescribed today were discussed, and patient expressed understanding.   Education regarding symptom management and diagnosis given to patient on AVS.  Continue to follow with Burnard Hawthorne, FNP for routine health maintenance.   Anne Ng and I agreed with plan.   Mable Paris, FNP

## 2017-11-29 NOTE — Telephone Encounter (Signed)
Patient made aware at office visit 11/29/17 and discussed

## 2017-11-29 NOTE — Patient Instructions (Addendum)
May take tyleonol 8 hour arthritis for pain 1-2 tablets q8hrs prn for pain that is moderate to severe.   I will get back to you regarding increase of gabapentin after speaking with pharmacist.   Today we discussed referrals, orders. Pain management: Dr Robb Matar Franscisco  [phone]     I have placed these orders in the system for you.  Please be sure to give [place] a call if you have not heard from our office regarding this. We should hear from [place] within ONE week with information regarding your appointment. If not, please let me know immediately.

## 2017-12-02 ENCOUNTER — Encounter: Payer: Self-pay | Admitting: Family

## 2017-12-02 NOTE — Assessment & Plan Note (Addendum)
Acute on chronic.  Discussed with patient potential for increasing gabapentin however I am discussing this with pharmacist since patient is also on Cymbalta.  Also discussed that she would benefit from a consult with pain management, referral has been placed.  Completed FMLA paperwork today.  Advised patient I am happy to see her monthly for close follow-up.  She will let me know if any new or worsening symptoms.

## 2018-01-03 ENCOUNTER — Encounter: Payer: Self-pay | Admitting: Family

## 2018-01-03 ENCOUNTER — Other Ambulatory Visit: Payer: Self-pay | Admitting: Family

## 2018-01-03 DIAGNOSIS — M5442 Lumbago with sciatica, left side: Secondary | ICD-10-CM

## 2018-01-03 DIAGNOSIS — M545 Low back pain: Secondary | ICD-10-CM

## 2018-01-03 DIAGNOSIS — G8929 Other chronic pain: Secondary | ICD-10-CM

## 2018-01-03 MED ORDER — DULOXETINE HCL 30 MG PO CPEP: ORAL_CAPSULE | ORAL | 3 refills | Status: DC

## 2018-01-03 NOTE — Telephone Encounter (Signed)
Left message for patient to call.  Called  Pharmacy and advised them to disregard script sent in for Cymbalt 30 mg 2 tablets daily.

## 2018-01-03 NOTE — Telephone Encounter (Signed)
Patient called back advised script called in for Cymbalta 60mg .

## 2018-01-03 NOTE — Telephone Encounter (Signed)
   Call pt  See below phone note that I sent her   [name],   So that you are aware, I sent in the cymbalta 60mg  tablets. You can take one a day.  I wanted to ensure that was the dose you were on? Appears you had been taking TWO of the 30mg  tablets?  Sandra Gray

## 2018-01-08 ENCOUNTER — Ambulatory Visit (INDEPENDENT_AMBULATORY_CARE_PROVIDER_SITE_OTHER): Payer: 59

## 2018-01-08 ENCOUNTER — Ambulatory Visit: Payer: 59 | Admitting: Family

## 2018-01-08 ENCOUNTER — Encounter: Payer: Self-pay | Admitting: Family

## 2018-01-08 VITALS — BP 130/80 | HR 104 | Wt 218.4 lb

## 2018-01-08 DIAGNOSIS — E785 Hyperlipidemia, unspecified: Secondary | ICD-10-CM

## 2018-01-08 DIAGNOSIS — I10 Essential (primary) hypertension: Secondary | ICD-10-CM

## 2018-01-08 DIAGNOSIS — M79605 Pain in left leg: Secondary | ICD-10-CM | POA: Diagnosis not present

## 2018-01-08 DIAGNOSIS — M545 Low back pain, unspecified: Secondary | ICD-10-CM

## 2018-01-08 DIAGNOSIS — R000 Tachycardia, unspecified: Secondary | ICD-10-CM | POA: Diagnosis not present

## 2018-01-08 DIAGNOSIS — F419 Anxiety disorder, unspecified: Secondary | ICD-10-CM | POA: Diagnosis not present

## 2018-01-08 DIAGNOSIS — F32A Depression, unspecified: Secondary | ICD-10-CM

## 2018-01-08 DIAGNOSIS — M79604 Pain in right leg: Secondary | ICD-10-CM

## 2018-01-08 DIAGNOSIS — F329 Major depressive disorder, single episode, unspecified: Secondary | ICD-10-CM

## 2018-01-08 DIAGNOSIS — G8929 Other chronic pain: Secondary | ICD-10-CM

## 2018-01-08 DIAGNOSIS — E118 Type 2 diabetes mellitus with unspecified complications: Secondary | ICD-10-CM

## 2018-01-08 NOTE — Patient Instructions (Addendum)
Today we discussed referrals, orders. Pain management   I have placed these orders in the system for you.  Please be sure to give [place] a call if you have not heard from our office regarding this. We should hear from [place] within ONE week with information regarding your appointment. If not, please let me know immediately.   Please return for complete physical exam.   Let me know if you need anything else.

## 2018-01-08 NOTE — Assessment & Plan Note (Signed)
Declines referral to counseling or augmentation with wellbutrin. Doing well cymbalta. WIll continue.

## 2018-01-08 NOTE — Assessment & Plan Note (Signed)
Stable , some improvement. Declines repeat MRI.

## 2018-01-08 NOTE — Progress Notes (Signed)
Subjective:    Patient ID: Sandra Gray, female    DOB: [birth year], 47 y.o.   MRN: [id number]  CC: Sandra Gray is a 47 y.o. female who presents today for follow up.   HPI: Left leg pain since this morning.   Rates 6/10.   [place] this morning in the closet on carpet onto pile of clothes. States knee gave out. No numbness going down left leg. No knee pain, swelling.  She would like x-rays of both  legs  CLBP- Has improved on medications. Pain 'is not as sharp.' Taking gabapentin 370m qhs with relief. Cannot take during the day due to sedation.  Dr PTrenton Gammon( neurosurgery), Dr CSharlet Salina( physical medicine).  NO urinary or bowel incontinuence.   HTN- compliant with medication. Doesn't check BP at home. Denies exertional chest pain or pressure, numbness or tingling radiating to left arm or jaw, palpitations, dizziness, frequent headaches, changes in vision, or shortness of breath.   Depression- doing well on cymbalta. Some fatigue. No si/hi.   HLD - on crestor  DM- on metformin.      HISTORY:  Past Medical History:  Diagnosis Date  . Asthma 2008  . Bronchitis   . Chronic kidney disease    "declining kidney, but it's stable right now"  . Fibroids   . Hypertension 2013   Started on lisinopril  . Pneumonia    Past Surgical History:  Procedure Laterality Date  . DILATION AND CURETTAGE OF UTERUS  08/2011  . HYSTEROSCOPY  02/15/2012   Fibroid Removal  . HYSTEROSCOPY  may 2014  . ROBOTIC ASSISTED SALPINGO OOPHERECTOMY Bilateral 07/28/2015   Procedure: XI ROBOTIC ASSISTED BILATERAL SALPINGO OOPHORECTOMY AND LYSIS OF ADHESIONS;  Surgeon: EEveritt Amber MD;  Location: [facility];  Service: Gynecology;  Laterality: Bilateral;  . SUPRACERVICAL ABDOMINAL HYSTERECTOMY N/A 08/31/2014   Procedure: HYSTERECTOMY SUPRACERVICAL ABDOMINAL;  Surgeon: JJonnie Kind MD;  Location: [facility];  Service: Gynecology;  Laterality: N/A;  Dr. EElonda Huskywill assist   Family History  Problem Relation Age of Onset    . Hypertension Mother   . Hypertension Father   . Diabetes Father   . Kidney disease Maternal Grandmother   . Cancer Maternal Grandfather        Lung - Asbestos exposure  . Diabetes Paternal Grandmother   . Heart disease Paternal Grandfather   . Breast cancer Neg Hx     Allergies: Gadolinium derivatives Current Outpatient Medications on File Prior to Visit  Medication Sig Dispense Refill  . albuterol (PROVENTIL HFA;VENTOLIN HFA) 108 (90 Base) MCG/ACT inhaler Inhale 2 puffs into the lungs every 6 (six) hours as needed for wheezing. 1 Inhaler 2  . albuterol (PROVENTIL) (2.5 MG/3ML) 0.083% nebulizer solution Take 3 mLs (2.5 mg total) by nebulization every 6 (six) hours as needed for wheezing or shortness of breath. 150 mL 1  . amLODipine (NORVASC) 5 MG tablet TAKE 1 TABLET BY MOUTH  DAILY 90 tablet 1  . cyclobenzaprine (FLEXERIL) 5 MG tablet Take 1 tablet (5 mg total) by mouth at bedtime as needed for muscle spasms. 90 tablet 0  . DULoxetine (CYMBALTA) 60 MG capsule Take 1 capsule (60 mg total) by mouth daily. 90 capsule 1  . fluticasone (FLOVENT HFA) 110 MCG/ACT inhaler Inhale 1 puff into the lungs 2 (two) times daily. 1 Inhaler 0  . gabapentin (NEURONTIN) 100 MG capsule TAKE 3 CAPSULES BY MOUTH AT BEDTIME 270 capsule 1  . lidocaine (LIDODERM) 5 % UNWRAP AND APPLY 1 PATCH  TO SKIN DAILY, REMOVE AND DISCARD PATCH WITHIN 12 HOURS 30 patch 1  . lisinopril (PRINIVIL,ZESTRIL) 5 MG tablet TAKE 1 TABLET BY MOUTH  DAILY 90 tablet 1  . metFORMIN (GLUCOPHAGE XR) 500 MG 24 hr tablet Start 558m PO qpm. 90 tablet 3  . norethindrone (AYGESTIN) 5 MG tablet TAKE 1 TABLET BY MOUTH  DAILY 90 tablet 1  . rosuvastatin (CRESTOR) 5 MG tablet Take 1 tablet (5 mg total) by mouth daily. 90 tablet 3  . triamcinolone (KENALOG) 0.025 % cream Apply 1 application topically 2 (two) times daily. 80 g 1  . Turmeric 500 MG TABS Take by mouth daily.     No current facility-administered medications on file prior to visit.      Social History   Tobacco Use  . Smoking status: Never Smoker  . Smokeless tobacco: Never Used  Substance Use Topics  . Alcohol use: Yes    Alcohol/week: 1.0 standard drinks    Types: 1 Standard drinks or equivalent per week    Comment: Rarely  . Drug use: No    Review of Systems  Constitutional: Negative for chills and fever.  Respiratory: Negative for cough.   Cardiovascular: Negative for chest pain and palpitations.  Gastrointestinal: Negative for nausea and vomiting.  Musculoskeletal: Positive for arthralgias and back pain.  Neurological: Negative for numbness.  Psychiatric/Behavioral: Negative for suicidal ideas. The patient is not nervous/anxious.       Objective:    BP 130/80 (Cuff Size: Large)   Pulse (!) 104   Wt 218 lb 6.4 oz (99.1 kg)   LMP 08/02/2014   SpO2 100%   BMI 39.95 kg/m  BP Readings from Last 3 Encounters:  01/08/18 130/80  11/29/17 124/84  10/18/17 118/90   Wt Readings from Last 3 Encounters:  01/08/18 218 lb 6.4 oz (99.1 kg)  11/29/17 223 lb (101.2 kg)  10/18/17 226 lb 2 oz (102.6 kg)    Physical Exam  Constitutional: She appears well-developed and well-nourished.  Eyes: Conjunctivae are normal.  Cardiovascular: Normal rate, regular rhythm, normal heart sounds and normal pulses.  Pulmonary/Chest: Effort normal and breath sounds normal. She has no wheezes. She has no rhonchi. She has no rales.  Musculoskeletal:       Right upper leg: She exhibits no tenderness, no bony tenderness, no swelling and no deformity.       Left upper leg: She exhibits no tenderness, no bony tenderness, no swelling and no deformity.       Right lower leg: She exhibits no tenderness, no bony tenderness and no deformity.       Left lower leg: She exhibits no tenderness, no bony tenderness and no edema.  Sensation intact bilateral lower extremities.   Neurological: She is alert.  Skin: Skin is warm and dry.  Psychiatric: She has a normal mood and affect. Her  speech is normal and behavior is normal. Thought content normal.  Vitals reviewed.      Assessment & Plan:   Problem List Items Addressed This Visit      Cardiovascular and Mediastinum   Hypertension (Chronic)    Mildly elevated today, suspect pain contributory.  No changes to regimen today.  Will follow        Endocrine   Type 2 diabetes mellitus with complication, without long-term current use of insulin (HCC)   Relevant Orders   Comp Met (CMET)   CBC w/Diff (Completed)   HgB A1c (Completed)     Other   Anxiety  and depression    Declines referral to counseling or augmentation with wellbutrin. Doing well cymbalta. WIll continue.       Chronic low back pain    Stable , some improvement. Declines repeat MRI.       Hyperlipidemia   Relevant Orders   Lipid panel (Completed)   Pain in both lower extremities - Primary    Normal exam.  Pending x-rays.      Relevant Orders   DG FEMUR MIN 2 VIEWS LEFT (Completed)   DG Tibia/Fibula Left (Completed)   DG FEMUR, MIN 2 VIEWS RIGHT (Completed)   DG Tibia/Fibula Right (Completed)   Tachycardia    Suspect pain contributing to elevated heart rate.  Pending labs for further evaluation.  Close follow-up      Relevant Orders   Comp Met (CMET)   CBC w/Diff (Completed)   TSH (Completed)       I am having Sandra Gray "Kim" maintain her albuterol, fluticasone, triamcinolone, lidocaine, Turmeric, cyclobenzaprine, norethindrone, amLODipine, metFORMIN, rosuvastatin, gabapentin, lisinopril, albuterol, and DULoxetine.   No orders of the defined types were placed in this encounter.   Return precautions given.   Risks, benefits, and alternatives of the medications and treatment plan prescribed today were discussed, and patient expressed understanding.   Education regarding symptom management and diagnosis given to patient on AVS.  Continue to follow with Burnard Hawthorne, FNP for routine health maintenance.   Sandra Gray and I agreed with plan.   Mable Paris, FNP

## 2018-01-10 LAB — CBC WITH DIFFERENTIAL/PLATELET
BASOS ABS: 0.1 10*3/uL (ref 0.0–0.2)
Basos: 0 %
EOS (ABSOLUTE): 0.3 10*3/uL (ref 0.0–0.4)
Eos: 2 %
HEMOGLOBIN: 12.8 g/dL (ref 11.1–15.9)
Hematocrit: 40 % (ref 34.0–46.6)
IMMATURE GRANS (ABS): 0 10*3/uL (ref 0.0–0.1)
IMMATURE GRANULOCYTES: 0 %
LYMPHS: 28 %
Lymphocytes Absolute: 3.2 10*3/uL — ABNORMAL HIGH (ref 0.7–3.1)
MCH: 25.3 pg — AB (ref 26.6–33.0)
MCHC: 32 g/dL (ref 31.5–35.7)
MCV: 79 fL (ref 79–97)
MONOCYTES: 7 %
Monocytes Absolute: 0.8 10*3/uL (ref 0.1–0.9)
NEUTROS ABS: 7.3 10*3/uL — AB (ref 1.4–7.0)
NEUTROS PCT: 63 %
PLATELETS: 318 10*3/uL (ref 150–450)
RBC: 5.05 x10E6/uL (ref 3.77–5.28)
RDW: 13.4 % (ref 12.3–15.4)
WBC: 11.6 10*3/uL — ABNORMAL HIGH (ref 3.4–10.8)

## 2018-01-10 LAB — TSH: TSH: 2.08 u[IU]/mL (ref 0.450–4.500)

## 2018-01-10 LAB — LIPID PANEL
Chol/HDL Ratio: 2.6 ratio (ref 0.0–4.4)
Cholesterol, Total: 126 mg/dL (ref 100–199)
HDL: 48 mg/dL (ref >39)
LDL Calculated: 63 mg/dL (ref 0–99)
TRIGLYCERIDES: 73 mg/dL (ref 0–149)
VLDL Cholesterol Cal: 15 mg/dL (ref 5–40)

## 2018-01-10 LAB — HEMOGLOBIN A1C
Est. average glucose Bld gHb Est-mCnc: 140 mg/dL
Hgb A1c MFr Bld: 6.5 % — ABNORMAL HIGH (ref 4.8–5.6)

## 2018-01-13 DIAGNOSIS — M79605 Pain in left leg: Secondary | ICD-10-CM | POA: Insufficient documentation

## 2018-01-13 DIAGNOSIS — M79604 Pain in right leg: Secondary | ICD-10-CM | POA: Insufficient documentation

## 2018-01-13 DIAGNOSIS — R000 Tachycardia, unspecified: Secondary | ICD-10-CM | POA: Insufficient documentation

## 2018-01-13 NOTE — Assessment & Plan Note (Signed)
Suspect pain contributing to elevated heart rate.  Pending labs for further evaluation.  Close follow-up

## 2018-01-13 NOTE — Assessment & Plan Note (Signed)
Mildly elevated today, suspect pain contributory.  No changes to regimen today.  Will follow

## 2018-01-13 NOTE — Assessment & Plan Note (Signed)
Normal exam.  Pending x-rays.

## 2018-01-14 ENCOUNTER — Other Ambulatory Visit: Payer: Self-pay | Admitting: Family

## 2018-01-14 DIAGNOSIS — R7989 Other specified abnormal findings of blood chemistry: Secondary | ICD-10-CM

## 2018-01-14 NOTE — Progress Notes (Signed)
[facility] has called her to try to schedule. I sent her a mychart message with the phone number to call to schedule.

## 2018-01-17 ENCOUNTER — Ambulatory Visit: Payer: Self-pay | Admitting: Family

## 2018-01-27 ENCOUNTER — Other Ambulatory Visit: Payer: Self-pay | Admitting: Family

## 2018-01-29 ENCOUNTER — Encounter: Payer: Self-pay | Admitting: Family

## 2018-01-30 ENCOUNTER — Other Ambulatory Visit: Payer: Self-pay

## 2018-01-31 ENCOUNTER — Ambulatory Visit: Payer: 59 | Admitting: Family

## 2018-01-31 ENCOUNTER — Encounter: Payer: Self-pay | Admitting: Family

## 2018-01-31 ENCOUNTER — Other Ambulatory Visit: Payer: Self-pay

## 2018-01-31 VITALS — BP 120/84 | HR 96 | Wt 216.8 lb

## 2018-01-31 DIAGNOSIS — M5442 Lumbago with sciatica, left side: Secondary | ICD-10-CM

## 2018-01-31 DIAGNOSIS — M545 Low back pain: Secondary | ICD-10-CM | POA: Diagnosis not present

## 2018-01-31 DIAGNOSIS — M25562 Pain in left knee: Secondary | ICD-10-CM

## 2018-01-31 DIAGNOSIS — G8929 Other chronic pain: Secondary | ICD-10-CM

## 2018-01-31 DIAGNOSIS — M25561 Pain in right knee: Secondary | ICD-10-CM | POA: Diagnosis not present

## 2018-01-31 MED ORDER — DULOXETINE HCL 60 MG PO CPEP: 60.0000 mg | ORAL_CAPSULE | Freq: Every day | ORAL | 0 refills | Status: DC

## 2018-01-31 MED ORDER — DULOXETINE HCL 60 MG PO CPEP: 60.0000 mg | ORAL_CAPSULE | Freq: Every day | ORAL | 1 refills | Status: DC

## 2018-01-31 MED ORDER — MELOXICAM 7.5 MG PO TABS: 7.5000 mg | ORAL_TABLET | Freq: Every day | ORAL | 0 refills | Status: DC | PRN

## 2018-01-31 NOTE — Assessment & Plan Note (Signed)
Improved on Cymbalta.  Will continue gabapentin, and as needed Flexeril.  Advised strong discretion with Flexeril since can be sedating.  She verbalized understanding of this.  Continue to await appointment with pain management.

## 2018-01-31 NOTE — Assessment & Plan Note (Addendum)
Reviewed prior x-ray films with patient today in office.  X-ray support arthritic changes of bilateral knees.  Based on presentation, I suspect meniscal etiology as well.  Discussed conservative therapy including as needed meloxicam, ice therapy, neoprene sleeve.  Patient politely declines orthopedic referral at this time for knee pain.  She will do exercises at home, declines formal physical therapy at this time.  Close follow-up.  Advised short-term short use of meloxicam, with food.

## 2018-01-31 NOTE — Patient Instructions (Addendum)
Start mobic as needed. Try not to use daily. This is an NSAID.  Please do not take other anti-inflammatory such as Aleve, ibuprofen  Take mobic with food.   Limit use of flexeril particularly since on gabapentin.   Ice ice ice of the knees  Happy Holiday!
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
## 2018-02-01 LAB — CBC WITH DIFFERENTIAL/PLATELET
BASOS ABS: 0.1 10*3/uL (ref 0.0–0.2)
BASOS: 1 %
EOS (ABSOLUTE): 0.3 10*3/uL (ref 0.0–0.4)
Eos: 2 %
Hematocrit: 39.8 % (ref 34.0–46.6)
Hemoglobin: 12.4 g/dL (ref 11.1–15.9)
IMMATURE GRANS (ABS): 0 10*3/uL (ref 0.0–0.1)
IMMATURE GRANULOCYTES: 0 %
LYMPHS: 27 %
Lymphocytes Absolute: 3.5 10*3/uL — ABNORMAL HIGH (ref 0.7–3.1)
MCH: 25.2 pg — ABNORMAL LOW (ref 26.6–33.0)
MCHC: 31.2 g/dL — ABNORMAL LOW (ref 31.5–35.7)
MCV: 81 fL (ref 79–97)
Monocytes Absolute: 0.8 10*3/uL (ref 0.1–0.9)
Monocytes: 7 %
NEUTROS PCT: 63 %
Neutrophils Absolute: 8 10*3/uL — ABNORMAL HIGH (ref 1.4–7.0)
PLATELETS: 320 10*3/uL (ref 150–450)
RBC: 4.93 x10E6/uL (ref 3.77–5.28)
RDW: 13 % (ref 12.3–15.4)
WBC: 12.6 10*3/uL — ABNORMAL HIGH (ref 3.4–10.8)

## 2018-02-02 ENCOUNTER — Other Ambulatory Visit: Payer: Self-pay | Admitting: Family

## 2018-02-02 DIAGNOSIS — D72829 Elevated white blood cell count, unspecified: Secondary | ICD-10-CM

## 2018-02-07 DIAGNOSIS — D72829 Elevated white blood cell count, unspecified: Secondary | ICD-10-CM | POA: Insufficient documentation

## 2018-02-07 NOTE — Progress Notes (Deleted)
[place]  [phone] Fax:(336) [phone]  ID: Sandra Gray OB: [birth year]  MR#: [id number]  UXL#:[id number]  Patient Care Team: Burnard Hawthorne, FNP as PCP - General (Family Medicine) Rey, Latina Craver, NP (Adult Health Nurse Practitioner)  CHIEF COMPLAINT: Leukocytosis.  INTERVAL HISTORY: ***  REVIEW OF SYSTEMS:   ROS  As per HPI. Otherwise, a complete review of systems is negative.  PAST MEDICAL HISTORY: Past Medical History:  Diagnosis Date  . Asthma 2008  . Bronchitis   . Chronic kidney disease    "declining kidney, but it's stable right now"  . Fibroids   . Hypertension 2013   Started on lisinopril  . Pneumonia     PAST SURGICAL HISTORY: Past Surgical History:  Procedure Laterality Date  . DILATION AND CURETTAGE OF UTERUS  08/2011  . HYSTEROSCOPY  02/15/2012   Fibroid Removal  . HYSTEROSCOPY  may 2014  . ROBOTIC ASSISTED SALPINGO OOPHERECTOMY Bilateral 07/28/2015   Procedure: XI ROBOTIC ASSISTED BILATERAL SALPINGO OOPHORECTOMY AND LYSIS OF ADHESIONS;  Surgeon: Everitt Amber, MD;  Location: [facility];  Service: Gynecology;  Laterality: Bilateral;  . SUPRACERVICAL ABDOMINAL HYSTERECTOMY N/A 08/31/2014   Procedure: HYSTERECTOMY SUPRACERVICAL ABDOMINAL;  Surgeon: [name], MD;  Location: [facility];  Service: Gynecology;  Laterality: N/A;  Dr. Elonda Husky will assist    FAMILY HISTORY: Family History  Problem Relation Age of Onset  . Hypertension Mother   . Hypertension Father   . Diabetes Father   . Kidney disease Maternal Grandmother   . Cancer Maternal Grandfather        Lung - Asbestos exposure  . Diabetes Paternal Grandmother   . Heart disease Paternal Grandfather   . Breast cancer Neg Hx     ADVANCED DIRECTIVES (Y/N):  N  HEALTH MAINTENANCE: Social History   Tobacco Use  . Smoking status: Never Smoker  . Smokeless tobacco: Never Used  Substance Use Topics  . Alcohol use: Yes    Alcohol/week: 1.0 standard drinks   Types: 1 Standard drinks or equivalent per week    Comment: Rarely  . Drug use: No     Colonoscopy:  PAP:  Bone density:  Lipid panel:  Allergies  Allergen Reactions  . Gadolinium Derivatives Nausea Only and Other (See Comments)    Dry heaves    Current Outpatient Medications  Medication Sig Dispense Refill  . albuterol (PROVENTIL HFA;VENTOLIN HFA) 108 (90 Base) MCG/ACT inhaler Inhale 2 puffs into the lungs every 6 (six) hours as needed for wheezing. 1 Inhaler 2  . albuterol (PROVENTIL) (2.5 MG/3ML) 0.083% nebulizer solution Take 3 mLs (2.5 mg total) by nebulization every 6 (six) hours as needed for wheezing or shortness of breath. 150 mL 1  . amLODipine (NORVASC) 5 MG tablet TAKE 1 TABLET BY MOUTH  DAILY 90 tablet 1  . cyclobenzaprine (FLEXERIL) 5 MG tablet Take 1 tablet (5 mg total) by mouth at bedtime as needed for muscle spasms. 90 tablet 0  . DULoxetine (CYMBALTA) 60 MG capsule Take 1 capsule (60 mg total) by mouth daily. 90 capsule 0  . fluticasone (FLOVENT HFA) 110 MCG/ACT inhaler Inhale 1 puff into the lungs 2 (two) times daily. 1 Inhaler 0  . gabapentin (NEURONTIN) 100 MG capsule TAKE 3 CAPSULES BY MOUTH AT BEDTIME 270 capsule 1  . lidocaine (LIDODERM) 5 % UNWRAP AND APPLY 1 PATCH TO SKIN DAILY, REMOVE AND DISCARD PATCH WITHIN 12 HOURS 30 patch 1  . lisinopril (PRINIVIL,ZESTRIL) 5 MG tablet TAKE 1 TABLET BY  MOUTH  DAILY 90 tablet 1  . meloxicam (MOBIC) 7.5 MG tablet Take 1 tablet (7.5 mg total) by mouth daily as needed for pain. 90 tablet 0  . metFORMIN (GLUCOPHAGE XR) 500 MG 24 hr tablet Start 500mg  PO qpm. 90 tablet 3  . norethindrone (AYGESTIN) 5 MG tablet TAKE 1 TABLET BY MOUTH  DAILY 90 tablet 1  . rosuvastatin (CRESTOR) 5 MG tablet Take 1 tablet (5 mg total) by mouth daily. 90 tablet 3  . triamcinolone (KENALOG) 0.025 % cream Apply 1 application topically 2 (two) times daily. 80 g 1  . Turmeric 500 MG TABS Take by mouth daily.     No current facility-administered  medications for this visit.     OBJECTIVE: There were no vitals filed for this visit.   There is no height or weight on file to calculate BMI.    ECOG FS:{CHL ONC [id number]  General: Well-developed, well-nourished, no acute distress. Eyes: Pink conjunctiva, anicteric sclera. HEENT: Normocephalic, moist mucous membranes, clear oropharnyx. Lungs: Clear to auscultation bilaterally. Heart: Regular rate and rhythm. No rubs, murmurs, or gallops. Abdomen: Soft, nontender, nondistended. No organomegaly noted, normoactive bowel sounds. Musculoskeletal: No edema, cyanosis, or clubbing. Neuro: Alert, answering all questions appropriately. Cranial nerves grossly intact. Skin: No rashes or petechiae noted. Psych: Normal affect. Lymphatics: No cervical, calvicular, axillary or inguinal LAD.   LAB RESULTS:  Lab Results  Component Value Date   NA 139 09/24/2016   K 3.8 09/24/2016   CL 106 09/24/2016   CO2 25 09/24/2016   GLUCOSE 108 (H) 09/24/2016   BUN 11 09/24/2016   CREATININE 1.20 09/24/2016   CALCIUM 9.7 09/24/2016   PROT 8.4 (H) 04/25/2016   ALBUMIN 4.2 04/25/2016   AST 13 04/25/2016   ALT 17 04/25/2016   ALKPHOS 81 04/25/2016   BILITOT 0.5 04/25/2016   GFRNONAA 55 (L) 07/22/2015   GFRAA >60 07/22/2015    Lab Results  Component Value Date   WBC 12.6 (H) 01/31/2018   NEUTROABS 8.0 (H) 01/31/2018   HGB 12.4 01/31/2018   HCT 39.8 01/31/2018   MCV 81 01/31/2018   PLT 320 01/31/2018     STUDIES: No results found.  ASSESSMENT: Leukocytosis  PLAN:    1. Leukocytosis:  Patient expressed understanding and was in agreement with this plan. She also understands that She can call clinic at any time with any questions, concerns, or complaints.   Cancer Staging No matching staging information was found for the patient.  Lloyd Huger, MD   02/07/2018 11:29 AM
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
## 2018-02-13 ENCOUNTER — Inpatient Hospital Stay: Payer: 59 | Admitting: Oncology

## 2018-02-14 NOTE — Progress Notes (Signed)
[place]  Telephone:(336) [phone] Fax:(336) [phone]  ID: Anne Ng OB: [birth year]  MR#: [id number]  UVO#:[id number]  Patient Care Team: Burnard Hawthorne, FNP as PCP - General (Family Medicine) Rey, Latina Craver, NP (Adult Health Nurse Practitioner)  CHIEF COMPLAINT: Leukocytosis.  INTERVAL HISTORY: Patient is a 48 year old female who was noted to have a persistent leukocytosis on routine blood work.  She currently feels well and is asymptomatic.  She has no neurologic complaints.  She denies any recent fevers or illnesses.  She has no new medications.  She has a good appetite and denies weight loss.  She has no chest pain or shortness of breath.  She denies any nausea, vomiting, constipation, or diarrhea.  She has no urinary complaints.  Patient feels at her baseline offers no specific complaints today.  REVIEW OF SYSTEMS:   Review of Systems  Constitutional: Negative.  Negative for fever, malaise/fatigue and weight loss.  Respiratory: Negative.  Negative for cough, hemoptysis and shortness of breath.   Cardiovascular: Negative.  Negative for chest pain and leg swelling.  Gastrointestinal: Negative.  Negative for abdominal pain.  Genitourinary: Negative.  Negative for dysuria.  Musculoskeletal: Negative.  Negative for back pain.  Skin: Negative.  Negative for rash.  Neurological: Negative.  Negative for focal weakness, weakness and headaches.  Psychiatric/Behavioral: Negative.  The patient is not nervous/anxious.     As per HPI. Otherwise, a complete review of systems is negative.  PAST MEDICAL HISTORY: Past Medical History:  Diagnosis Date  . Asthma 2008  . Bronchitis   . Chronic kidney disease    "declining kidney, but it's stable right now"  . Fibroids   . Hypertension 2013   Started on lisinopril  . Pneumonia     PAST SURGICAL HISTORY: Past Surgical History:  Procedure Laterality Date  . DILATION AND CURETTAGE OF UTERUS  08/2011  .  HYSTEROSCOPY  02/15/2012   Fibroid Removal  . HYSTEROSCOPY  may 2014  . ROBOTIC ASSISTED SALPINGO OOPHERECTOMY Bilateral 07/28/2015   Procedure: XI ROBOTIC ASSISTED BILATERAL SALPINGO OOPHORECTOMY AND LYSIS OF ADHESIONS;  Surgeon: Everitt Amber, MD;  Location: [facility];  Service: Gynecology;  Laterality: Bilateral;  . SUPRACERVICAL ABDOMINAL HYSTERECTOMY N/A 08/31/2014   Procedure: HYSTERECTOMY SUPRACERVICAL ABDOMINAL;  Surgeon: [name], MD;  Location: [facility];  Service: Gynecology;  Laterality: N/A;  Dr. Elonda Husky will assist    FAMILY HISTORY: Family History  Problem Relation Age of Onset  . Hypertension Mother   . Hypertension Father   . Diabetes Father   . Kidney disease Maternal Grandmother   . Cancer Maternal Grandfather        Lung - Asbestos exposure  . Diabetes Paternal Grandmother   . Heart disease Paternal Grandfather   . Breast cancer Neg Hx     ADVANCED DIRECTIVES (Y/N):  N  HEALTH MAINTENANCE: Social History   Tobacco Use  . Smoking status: Never Smoker  . Smokeless tobacco: Never Used  Substance Use Topics  . Alcohol use: Yes    Alcohol/week: 1.0 standard drinks    Types: 1 Standard drinks or equivalent per week    Comment: Rarely  . Drug use: No     Colonoscopy:  PAP:  Bone density:  Lipid panel:  Allergies  Allergen Reactions  . Gadolinium Derivatives Nausea Only and Other (See Comments)    Dry heaves    Current Outpatient Medications  Medication Sig Dispense Refill  . albuterol (PROVENTIL HFA;VENTOLIN HFA) 108 (90 Base) MCG/ACT inhaler Inhale  2 puffs into the lungs every 6 (six) hours as needed for wheezing. 1 Inhaler 2  . albuterol (PROVENTIL) (2.5 MG/3ML) 0.083% nebulizer solution Take 3 mLs (2.5 mg total) by nebulization every 6 (six) hours as needed for wheezing or shortness of breath. 150 mL 1  . amLODipine (NORVASC) 5 MG tablet TAKE 1 TABLET BY MOUTH  DAILY 90 tablet 1  . cyclobenzaprine (FLEXERIL) 5 MG tablet Take 1 tablet (5 mg total) by  mouth at bedtime as needed for muscle spasms. 90 tablet 0  . DULoxetine (CYMBALTA) 60 MG capsule Take 1 capsule (60 mg total) by mouth daily. 90 capsule 0  . fluticasone (FLOVENT HFA) 110 MCG/ACT inhaler Inhale 1 puff into the lungs 2 (two) times daily. 1 Inhaler 0  . gabapentin (NEURONTIN) 100 MG capsule TAKE 3 CAPSULES BY MOUTH AT BEDTIME 270 capsule 1  . lidocaine (LIDODERM) 5 % UNWRAP AND APPLY 1 PATCH TO SKIN DAILY, REMOVE AND DISCARD PATCH WITHIN 12 HOURS 30 patch 1  . lisinopril (PRINIVIL,ZESTRIL) 5 MG tablet TAKE 1 TABLET BY MOUTH  DAILY 90 tablet 1  . meloxicam (MOBIC) 7.5 MG tablet Take 1 tablet (7.5 mg total) by mouth daily as needed for pain. 90 tablet 0  . metFORMIN (GLUCOPHAGE XR) 500 MG 24 hr tablet Start 58m PO qpm. 90 tablet 3  . norethindrone (AYGESTIN) 5 MG tablet TAKE 1 TABLET BY MOUTH  DAILY 90 tablet 1  . omega-3 acid ethyl esters (LOVAZA) 1 g capsule Take 1 capsule by mouth.    . rosuvastatin (CRESTOR) 5 MG tablet Take 1 tablet (5 mg total) by mouth daily. 90 tablet 3  . triamcinolone (KENALOG) 0.025 % cream Apply 1 application topically 2 (two) times daily. 80 g 1  . Turmeric 500 MG TABS Take by mouth daily.     No current facility-administered medications for this visit.     OBJECTIVE: Vitals:   02/17/18 1126  BP: (!) 144/90  Pulse: (!) 110  Temp: 98.3 F (36.8 C)     Body mass index is 39.32 kg/m.    ECOG FS:0 - Asymptomatic  General: Well-developed, well-nourished, no acute distress. Eyes: Pink conjunctiva, anicteric sclera. HEENT: Normocephalic, moist mucous membranes, clear oropharnyx. Lungs: Clear to auscultation bilaterally. Heart: Regular rate and rhythm. No rubs, murmurs, or gallops. Abdomen: Soft, nontender, nondistended. No organomegaly noted, normoactive bowel sounds. Musculoskeletal: No edema, cyanosis, or clubbing. Neuro: Alert, answering all questions appropriately. Cranial nerves grossly intact. Skin: No rashes or petechiae noted. Psych:  Normal affect. Lymphatics: No cervical, calvicular, axillary or inguinal LAD.   LAB RESULTS:  Lab Results  Component Value Date   NA 139 09/24/2016   K 3.8 09/24/2016   CL 106 09/24/2016   CO2 25 09/24/2016   GLUCOSE 108 (H) 09/24/2016   BUN 11 09/24/2016   CREATININE 1.20 09/24/2016   CALCIUM 9.7 09/24/2016   PROT 8.4 (H) 04/25/2016   ALBUMIN 4.2 04/25/2016   AST 13 04/25/2016   ALT 17 04/25/2016   ALKPHOS 81 04/25/2016   BILITOT 0.5 04/25/2016   GFRNONAA 55 (L) 07/22/2015   GFRAA >60 07/22/2015    Lab Results  Component Value Date   WBC 13.1 (H) 02/17/2018   NEUTROABS 8.1 (H) 02/17/2018   HGB 11.9 (L) 02/17/2018   HCT 39.7 02/17/2018   MCV 82.0 02/17/2018   PLT 315 02/17/2018     STUDIES: No results found.  ASSESSMENT: Leukocytosis  PLAN:    1. Leukocytosis: Patient's white blood cell count remains  mildly elevated at 13.1.  The remainder of her laboratory work is either negative or within normal limits.  Peripheral blood flow cytometry and BCR-ABL mutation are pending at time of dictation.  No intervention is needed at this time.  Patient does not require bone marrow biopsy.  Return to clinic in 3 months with repeat laboratory work and further evaluation. 2.  Anemia: Mild, iron stores are within normal limits.  I spent a total of 45 minutes face-to-face with the patient of which greater than 50% of the visit was spent in counseling and coordination of care as detailed above.   Patient expressed understanding and was in agreement with this plan. She also understands that She can call clinic at any time with any questions, concerns, or complaints.    Lloyd Huger, MD   02/19/2018 6:48 AM

## 2018-02-17 ENCOUNTER — Telehealth: Payer: Self-pay

## 2018-02-17 ENCOUNTER — Inpatient Hospital Stay: Payer: 59 | Attending: Oncology | Admitting: Oncology

## 2018-02-17 ENCOUNTER — Inpatient Hospital Stay: Payer: 59

## 2018-02-17 ENCOUNTER — Other Ambulatory Visit: Payer: Self-pay

## 2018-02-17 VITALS — BP 144/90 | HR 110 | Temp 98.3°F | Ht 62.0 in | Wt 215.0 lb

## 2018-02-17 DIAGNOSIS — N189 Chronic kidney disease, unspecified: Secondary | ICD-10-CM | POA: Insufficient documentation

## 2018-02-17 DIAGNOSIS — J45909 Unspecified asthma, uncomplicated: Secondary | ICD-10-CM | POA: Insufficient documentation

## 2018-02-17 DIAGNOSIS — D72829 Elevated white blood cell count, unspecified: Secondary | ICD-10-CM

## 2018-02-17 DIAGNOSIS — Z7984 Long term (current) use of oral hypoglycemic drugs: Secondary | ICD-10-CM

## 2018-02-17 DIAGNOSIS — D649 Anemia, unspecified: Secondary | ICD-10-CM | POA: Diagnosis not present

## 2018-02-17 DIAGNOSIS — Z791 Long term (current) use of non-steroidal anti-inflammatories (NSAID): Secondary | ICD-10-CM | POA: Diagnosis not present

## 2018-02-17 DIAGNOSIS — I129 Hypertensive chronic kidney disease with stage 1 through stage 4 chronic kidney disease, or unspecified chronic kidney disease: Secondary | ICD-10-CM | POA: Diagnosis not present

## 2018-02-17 DIAGNOSIS — Z79899 Other long term (current) drug therapy: Secondary | ICD-10-CM | POA: Diagnosis not present

## 2018-02-17 LAB — CBC WITH DIFFERENTIAL/PLATELET
Abs Immature Granulocytes: 0.04 10*3/uL (ref 0.00–0.07)
Basophils Absolute: 0 10*3/uL (ref 0.0–0.1)
Basophils Relative: 0 %
Eosinophils Absolute: 0.3 10*3/uL (ref 0.0–0.5)
Eosinophils Relative: 2 %
HCT: 39.7 % (ref 36.0–46.0)
HEMOGLOBIN: 11.9 g/dL — AB (ref 12.0–15.0)
IMMATURE GRANULOCYTES: 0 %
Lymphocytes Relative: 28 %
Lymphs Abs: 3.7 10*3/uL (ref 0.7–4.0)
MCH: 24.6 pg — ABNORMAL LOW (ref 26.0–34.0)
MCHC: 30 g/dL (ref 30.0–36.0)
MCV: 82 fL (ref 80.0–100.0)
Monocytes Absolute: 1 10*3/uL (ref 0.1–1.0)
Monocytes Relative: 7 %
NEUTROS ABS: 8.1 10*3/uL — AB (ref 1.7–7.7)
NEUTROS PCT: 63 %
Platelets: 315 10*3/uL (ref 150–400)
RBC: 4.84 MIL/uL (ref 3.87–5.11)
RDW: 13.8 % (ref 11.5–15.5)
WBC: 13.1 10*3/uL — ABNORMAL HIGH (ref 4.0–10.5)
nRBC: 0 % (ref 0.0–0.2)

## 2018-02-17 LAB — IRON AND TIBC
Iron: 50 ug/dL (ref 28–170)
SATURATION RATIOS: 13 % (ref 10.4–31.8)
TIBC: 389 ug/dL (ref 250–450)
UIBC: 340 ug/dL

## 2018-02-17 LAB — FERRITIN: Ferritin: 25 ng/mL (ref 11–307)

## 2018-02-17 NOTE — Telephone Encounter (Signed)
Copied from [place] [phone]. Topic: General - Other >> Feb 13, 2018  3:42 PM Leward Quan A wrote: Reason for CRM: Ayesha Rumpf with [facility] calling to request office notes for patients visit on 01/01/18 to be faxed to. CB# [phone] ext [phone]      Fax [phone]

## 2018-02-17 NOTE — Progress Notes (Signed)
Patient is here today as a new patient for Leukocytosis. Patient stated that she had been doing well over all.

## 2018-02-19 LAB — COMP PANEL: LEUKEMIA/LYMPHOMA

## 2018-02-20 LAB — BCR-ABL1, CML/ALL, PCR, QUANT

## 2018-02-28 ENCOUNTER — Ambulatory Visit: Payer: 59 | Admitting: Family

## 2018-02-28 ENCOUNTER — Encounter: Payer: Self-pay | Admitting: Family

## 2018-02-28 VITALS — BP 106/78 | HR 96 | Temp 98.0°F | Wt 217.6 lb

## 2018-02-28 DIAGNOSIS — M5442 Lumbago with sciatica, left side: Secondary | ICD-10-CM

## 2018-02-28 DIAGNOSIS — M545 Low back pain: Secondary | ICD-10-CM | POA: Diagnosis not present

## 2018-02-28 DIAGNOSIS — M25562 Pain in left knee: Secondary | ICD-10-CM

## 2018-02-28 DIAGNOSIS — E118 Type 2 diabetes mellitus with unspecified complications: Secondary | ICD-10-CM | POA: Diagnosis not present

## 2018-02-28 DIAGNOSIS — I10 Essential (primary) hypertension: Secondary | ICD-10-CM

## 2018-02-28 DIAGNOSIS — M25561 Pain in right knee: Secondary | ICD-10-CM

## 2018-02-28 DIAGNOSIS — G8929 Other chronic pain: Secondary | ICD-10-CM

## 2018-02-28 DIAGNOSIS — Z23 Encounter for immunization: Secondary | ICD-10-CM

## 2018-02-28 MED ORDER — DULOXETINE HCL 60 MG PO CPEP: 60.0000 mg | ORAL_CAPSULE | Freq: Every day | ORAL | 2 refills | Status: DC

## 2018-02-28 NOTE — Patient Instructions (Signed)
Think you are doing great.   Continue to increase walking and focus on diet.   Let me know if you need anything at all.

## 2018-02-28 NOTE — Assessment & Plan Note (Signed)
Compliant with medication.   will continue

## 2018-02-28 NOTE — Progress Notes (Signed)
Subjective:    Patient ID: Sandra Gray, female    DOB: 10-01-1970, 48 y.o.   MRN: [id number]  CC: Sandra Gray is a 48 y.o. female who presents today for follow up.   HPI: Chronic low back pain- overall improved.  Flexeril as needed, gabapentin is very helpful.   Knee pain- improved. No pain today. mobic is 'awesome.' Limits use.   GYN appt is coming up Feb. 2020 for annual.   HTN- Compliant with regimen. No CP.   Suspect elevated heart rate today related to 'rushing in and pain' today.   HLD- compliant with crestor  DM- complaint with metformin  Due for eye exam  Leukocytosis-    HISTORY:  Past Medical History:  Diagnosis Date  . Asthma 2008  . Bronchitis   . Chronic kidney disease    "declining kidney, but it's stable right now"  . Fibroids   . Hypertension 2013   Started on lisinopril  . Pneumonia    Past Surgical History:  Procedure Laterality Date  . DILATION AND CURETTAGE OF UTERUS  08/2011  . HYSTEROSCOPY  02/15/2012   Fibroid Removal  . HYSTEROSCOPY  may 2014  . ROBOTIC ASSISTED SALPINGO OOPHERECTOMY Bilateral 07/28/2015   Procedure: XI ROBOTIC ASSISTED BILATERAL SALPINGO OOPHORECTOMY AND LYSIS OF ADHESIONS;  Surgeon: Everitt Amber, MD;  Location: [facility];  Service: Gynecology;  Laterality: Bilateral;  . SUPRACERVICAL ABDOMINAL HYSTERECTOMY N/A 08/31/2014   Procedure: HYSTERECTOMY SUPRACERVICAL ABDOMINAL;  Surgeon: [name], MD;  Location: [facility];  Service: Gynecology;  Laterality: N/A;  Dr. Elonda Husky will assist   Family History  Problem Relation Age of Onset  . Hypertension Mother   . Hypertension Father   . Diabetes Father   . Kidney disease Maternal Grandmother   . Cancer Maternal Grandfather        Lung - Asbestos exposure  . Diabetes Paternal Grandmother   . Heart disease Paternal Grandfather   . Breast cancer Neg Hx     Allergies: Gadolinium derivatives Current Outpatient Medications on File Prior to Visit  Medication Sig Dispense  Refill  . albuterol (PROVENTIL HFA;VENTOLIN HFA) 108 (90 Base) MCG/ACT inhaler Inhale 2 puffs into the lungs every 6 (six) hours as needed for wheezing. 1 Inhaler 2  . albuterol (PROVENTIL) (2.5 MG/3ML) 0.083% nebulizer solution Take 3 mLs (2.5 mg total) by nebulization every 6 (six) hours as needed for wheezing or shortness of breath. 150 mL 1  . amLODipine (NORVASC) 5 MG tablet TAKE 1 TABLET BY MOUTH  DAILY 90 tablet 1  . cyclobenzaprine (FLEXERIL) 5 MG tablet Take 1 tablet (5 mg total) by mouth at bedtime as needed for muscle spasms. 90 tablet 0  . fluticasone (FLOVENT HFA) 110 MCG/ACT inhaler Inhale 1 puff into the lungs 2 (two) times daily. 1 Inhaler 0  . gabapentin (NEURONTIN) 100 MG capsule TAKE 3 CAPSULES BY MOUTH AT BEDTIME 270 capsule 1  . lidocaine (LIDODERM) 5 % UNWRAP AND APPLY 1 PATCH TO SKIN DAILY, REMOVE AND DISCARD PATCH WITHIN 12 HOURS 30 patch 1  . lisinopril (PRINIVIL,ZESTRIL) 5 MG tablet TAKE 1 TABLET BY MOUTH  DAILY 90 tablet 1  . meloxicam (MOBIC) 7.5 MG tablet Take 1 tablet (7.5 mg total) by mouth daily as needed for pain. 90 tablet 0  . metFORMIN (GLUCOPHAGE XR) 500 MG 24 hr tablet Start 500mg  PO qpm. 90 tablet 3  . norethindrone (AYGESTIN) 5 MG tablet TAKE 1 TABLET BY MOUTH  DAILY 90 tablet 1  . omega-3  acid ethyl esters (LOVAZA) 1 g capsule Take 1 capsule by mouth.    . rosuvastatin (CRESTOR) 5 MG tablet Take 1 tablet (5 mg total) by mouth daily. 90 tablet 3  . triamcinolone (KENALOG) 0.025 % cream Apply 1 application topically 2 (two) times daily. 80 g 1  . Turmeric 500 MG TABS Take by mouth daily.     No current facility-administered medications on file prior to visit.     Social History   Tobacco Use  . Smoking status: Never Smoker  . Smokeless tobacco: Never Used  Substance Use Topics  . Alcohol use: Yes    Alcohol/week: 1.0 standard drinks    Types: 1 Standard drinks or equivalent per week    Comment: Rarely  . Drug use: No    Review of Systems    Constitutional: Negative for chills and fever.  Respiratory: Negative for cough, shortness of breath and wheezing.   Cardiovascular: Positive for palpitations. Negative for chest pain.  Gastrointestinal: Negative for nausea and vomiting.  Musculoskeletal: Positive for arthralgias and back pain.  Neurological: Negative for weakness and numbness.      Objective:    BP 106/78 (BP Location: Left Arm, Patient Position: Sitting, Cuff Size: Large)   Pulse 96   Temp 98 F (36.7 C)   Wt 217 lb 9.6 oz (98.7 kg)   LMP 08/02/2014   SpO2 98%   BMI 39.80 kg/m  BP Readings from Last 3 Encounters:  02/28/18 106/78  02/17/18 (!) 144/90  01/31/18 120/84   Wt Readings from Last 3 Encounters:  02/28/18 217 lb 9.6 oz (98.7 kg)  02/17/18 215 lb (97.5 kg)  01/31/18 216 lb 12.8 oz (98.3 kg)    Physical Exam Vitals signs reviewed.  Constitutional:      Appearance: She is well-developed.  Eyes:     Conjunctiva/sclera: Conjunctivae normal.  Cardiovascular:     Rate and Rhythm: Normal rate and regular rhythm.     Pulses: Normal pulses.     Heart sounds: Normal heart sounds.  Pulmonary:     Effort: Pulmonary effort is normal.     Breath sounds: Normal breath sounds. No wheezing, rhonchi or rales.  Skin:    General: Skin is warm and dry.  Neurological:     Mental Status: She is alert.  Psychiatric:        Speech: Speech normal.        Behavior: Behavior normal.        Thought Content: Thought content normal.        Assessment & Plan:   Problem List Items Addressed This Visit      Cardiovascular and Mediastinum   Hypertension (Chronic)    Doing well on current regimen, will continue        Endocrine   Type 2 diabetes mellitus with complication, without long-term current use of insulin (HCC)    Compliant with medication.   will continue        Other   Chronic low back pain    Overall improved.  Doing well on Cymbalta, gabapentin.  PRN Flexeril, meloxicam.  Will continue  regimen.  Encouraged increase of walking program.      Relevant Medications   DULoxetine (CYMBALTA) 60 MG capsule   Chronic pain of both knees    Doing well on current regimen.  Reemphasized the importance of icing regimen, increase walking program      Relevant Medications   DULoxetine (CYMBALTA) 60 MG capsule    Other  Visit Diagnoses    Need for 23-polyvalent pneumococcal polysaccharide vaccine    -  Primary   Relevant Orders   Pneumococcal polysaccharide vaccine 23-valent greater than or equal to 2yo subcutaneous/IM (Completed)   Acute midline low back pain with left-sided sciatica       Relevant Medications   DULoxetine (CYMBALTA) 60 MG capsule       I am having Sandra Gray "Kim" maintain her albuterol, fluticasone, triamcinolone, lidocaine, Turmeric, cyclobenzaprine, norethindrone, metFORMIN, rosuvastatin, gabapentin, lisinopril, albuterol, amLODipine, meloxicam, omega-3 acid ethyl esters, and DULoxetine.   Meds ordered this encounter  Medications  . DULoxetine (CYMBALTA) 60 MG capsule    Sig: Take 1 capsule (60 mg total) by mouth daily.    Dispense:  90 capsule    Refill:  2    Order Specific Question:   Supervising Provider    Answer:   Crecencio Mc [[id number]]    Return precautions given.   Risks, benefits, and alternatives of the medications and treatment plan prescribed today were discussed, and patient expressed understanding.   Education regarding symptom management and diagnosis given to patient on AVS.  Continue to follow with Sandra Hawthorne, FNP for routine health maintenance.   Sandra Gray and I agreed with plan.   Mable Paris, FNP

## 2018-02-28 NOTE — Assessment & Plan Note (Signed)
Doing well on current regimen.  Reemphasized the importance of icing regimen, increase walking program

## 2018-02-28 NOTE — Assessment & Plan Note (Signed)
Doing well on current regimen, will continue 

## 2018-02-28 NOTE — Assessment & Plan Note (Signed)
Overall improved.  Doing well on Cymbalta, gabapentin.  PRN Flexeril, meloxicam.  Will continue regimen.  Encouraged increase of walking program.

## 2018-03-13 ENCOUNTER — Encounter: Payer: Self-pay | Admitting: Family

## 2018-03-14 ENCOUNTER — Telehealth: Payer: Self-pay | Admitting: *Deleted

## 2018-03-14 NOTE — Telephone Encounter (Signed)
Copied from [place] [phone]. Topic: General - Other >> Mar 13, 2018  4:39 PM Sandra Gray wrote: Reason for CRM:   Pt states she needs to speak with Sandra Gray's nurse because there is incorrect information that was sent into [employer] on a form for her.  States that there is no date of return on the paperwork - and that should be 04/15/2018.  Also states that extension paperwork was supposed to be completed and it seems like it hasn't been done.   Pt can be reached at [phone] >> Mar 14, 2018  8:47 AM Sandra Gray wrote: Patient  Is requesting a call back in regards to paperwork. The patient stated The Sandra Gray is needing correct dates. She stated this urgent.

## 2018-03-14 NOTE — Telephone Encounter (Signed)
I spoke with patient & have updated return to work date. I faxed back to [employer].

## 2018-03-20 ENCOUNTER — Other Ambulatory Visit: Payer: 59 | Admitting: Obstetrics and Gynecology

## 2018-03-31 ENCOUNTER — Encounter: Payer: Self-pay | Admitting: Family

## 2018-03-31 ENCOUNTER — Ambulatory Visit (INDEPENDENT_AMBULATORY_CARE_PROVIDER_SITE_OTHER): Payer: 59

## 2018-03-31 ENCOUNTER — Ambulatory Visit: Payer: 59 | Admitting: Family

## 2018-03-31 VITALS — BP 138/74 | HR 123 | Temp 98.3°F | Wt 220.5 lb

## 2018-03-31 DIAGNOSIS — F32A Depression, unspecified: Secondary | ICD-10-CM

## 2018-03-31 DIAGNOSIS — M79651 Pain in right thigh: Secondary | ICD-10-CM | POA: Diagnosis not present

## 2018-03-31 DIAGNOSIS — S79911A Unspecified injury of right hip, initial encounter: Secondary | ICD-10-CM | POA: Diagnosis not present

## 2018-03-31 DIAGNOSIS — F419 Anxiety disorder, unspecified: Secondary | ICD-10-CM | POA: Diagnosis not present

## 2018-03-31 DIAGNOSIS — S79921A Unspecified injury of right thigh, initial encounter: Secondary | ICD-10-CM | POA: Diagnosis not present

## 2018-03-31 DIAGNOSIS — F329 Major depressive disorder, single episode, unspecified: Secondary | ICD-10-CM | POA: Diagnosis not present

## 2018-03-31 NOTE — Assessment & Plan Note (Signed)
Anxiety of late as she approaches resigning from work.  Long discussion regard from this.  No changes made today however will follow.

## 2018-03-31 NOTE — Assessment & Plan Note (Addendum)
Improving. Suspect musculoskeletal strain, sprain.  Pending xr imaging today.  Patient will let me know if does not continue to improve

## 2018-03-31 NOTE — Progress Notes (Signed)
Subjective:    Patient ID: Sandra Gray, female    DOB: 03/19/1970, 48 y.o.   MRN: [id number]  CC: [name] is a 48 y.o. female who presents today for an acute visit.    HPI: Right thigh pain , underneath thigh, 5 days after fall,  a little better today.   Was feeding cat with son and right foot slipped out by cat bowl, heard popping sound by right hip.   "I didn't hurt my back at all.'   Took flexeril first 2 days without much relief. Mobic is helping. Able to weight bear however painful if opens legs wide.   No trouble urinating, numbness in legs.   Increased anxiety and panic attack, when [employer] calls regarding disability paperwork.has decided not return to [place]. Would like return to work letter so she can resign.  Will not have insurance. No cp, sob. No si/hi.      HISTORY:  Past Medical History:  Diagnosis Date  . Asthma 2008  . Bronchitis   . Chronic kidney disease    "declining kidney, but it's stable right now"  . Fibroids   . Hypertension 2013   Started on lisinopril  . Pneumonia    Past Surgical History:  Procedure Laterality Date  . DILATION AND CURETTAGE OF UTERUS  08/2011  . HYSTEROSCOPY  02/15/2012   Fibroid Removal  . HYSTEROSCOPY  may 2014  . ROBOTIC ASSISTED SALPINGO OOPHERECTOMY Bilateral 07/28/2015   Procedure: XI ROBOTIC ASSISTED BILATERAL SALPINGO OOPHORECTOMY AND LYSIS OF ADHESIONS;  Surgeon: Everitt Amber, MD;  Location: [facility];  Service: Gynecology;  Laterality: Bilateral;  . SUPRACERVICAL ABDOMINAL HYSTERECTOMY N/A 08/31/2014   Procedure: HYSTERECTOMY SUPRACERVICAL ABDOMINAL;  Surgeon: [name], MD;  Location: [facility];  Service: Gynecology;  Laterality: N/A;  Dr. Elonda Husky will assist   Family History  Problem Relation Age of Onset  . Hypertension Mother   . Hypertension Father   . Diabetes Father   . Kidney disease Maternal Grandmother   . Cancer Maternal Grandfather        Lung - Asbestos exposure  . Diabetes Paternal  Grandmother   . Heart disease Paternal Grandfather   . Breast cancer Neg Hx     Allergies: Gadolinium derivatives Current Outpatient Medications on File Prior to Visit  Medication Sig Dispense Refill  . albuterol (PROVENTIL HFA;VENTOLIN HFA) 108 (90 Base) MCG/ACT inhaler Inhale 2 puffs into the lungs every 6 (six) hours as needed for wheezing. 1 Inhaler 2  . albuterol (PROVENTIL) (2.5 MG/3ML) 0.083% nebulizer solution Take 3 mLs (2.5 mg total) by nebulization every 6 (six) hours as needed for wheezing or shortness of breath. 150 mL 1  . amLODipine (NORVASC) 5 MG tablet TAKE 1 TABLET BY MOUTH  DAILY 90 tablet 1  . cyclobenzaprine (FLEXERIL) 5 MG tablet Take 1 tablet (5 mg total) by mouth at bedtime as needed for muscle spasms. 90 tablet 0  . DULoxetine (CYMBALTA) 60 MG capsule Take 1 capsule (60 mg total) by mouth daily. 90 capsule 2  . fluticasone (FLOVENT HFA) 110 MCG/ACT inhaler Inhale 1 puff into the lungs 2 (two) times daily. 1 Inhaler 0  . gabapentin (NEURONTIN) 100 MG capsule TAKE 3 CAPSULES BY MOUTH AT BEDTIME 270 capsule 1  . lidocaine (LIDODERM) 5 % UNWRAP AND APPLY 1 PATCH TO SKIN DAILY, REMOVE AND DISCARD PATCH WITHIN 12 HOURS 30 patch 1  . lisinopril (PRINIVIL,ZESTRIL) 5 MG tablet TAKE 1 TABLET BY MOUTH  DAILY 90 tablet 1  .  meloxicam (MOBIC) 7.5 MG tablet Take 1 tablet (7.5 mg total) by mouth daily as needed for pain. 90 tablet 0  . metFORMIN (GLUCOPHAGE XR) 500 MG 24 hr tablet Start 500mg  PO qpm. 90 tablet 3  . norethindrone (AYGESTIN) 5 MG tablet TAKE 1 TABLET BY MOUTH  DAILY 90 tablet 1  . omega-3 acid ethyl esters (LOVAZA) 1 g capsule Take 1 capsule by mouth.    . rosuvastatin (CRESTOR) 5 MG tablet Take 1 tablet (5 mg total) by mouth daily. 90 tablet 3  . triamcinolone (KENALOG) 0.025 % cream Apply 1 application topically 2 (two) times daily. 80 g 1  . Turmeric 500 MG TABS Take by mouth daily.     No current facility-administered medications on file prior to visit.      Social History   Tobacco Use  . Smoking status: Never Smoker  . Smokeless tobacco: Never Used  Substance Use Topics  . Alcohol use: Yes    Alcohol/week: 1.0 standard drinks    Types: 1 Standard drinks or equivalent per week    Comment: Rarely  . Drug use: No    Review of Systems  Constitutional: Negative for chills and fever.  Respiratory: Negative for cough.   Cardiovascular: Negative for chest pain and palpitations.  Gastrointestinal: Negative for nausea and vomiting.  Genitourinary: Negative for difficulty urinating.  Musculoskeletal: Positive for arthralgias (right thigh) and back pain (chronic).  Neurological: Negative for dizziness, numbness and headaches.      Objective:    BP 138/74 (BP Location: Left Arm, Patient Position: Sitting, Cuff Size: Large)   Pulse (!) 123   Temp 98.3 F (36.8 C)   Wt 220 lb 8 oz (100 kg)   LMP 08/02/2014   SpO2 99%   BMI 40.33 kg/m    Physical Exam Vitals signs reviewed.  Constitutional:      Appearance: She is well-developed.  Eyes:     Conjunctiva/sclera: Conjunctivae normal.  Cardiovascular:     Rate and Rhythm: Normal rate and regular rhythm.     Pulses: Normal pulses.     Heart sounds: Normal heart sounds.  Pulmonary:     Effort: Pulmonary effort is normal.     Breath sounds: Normal breath sounds. No wheezing, rhonchi or rales.  Musculoskeletal:     Right hip: She exhibits decreased strength and tenderness. She exhibits normal range of motion, no swelling, no deformity and no laceration.     Left hip: She exhibits normal range of motion, normal strength, no tenderness and no swelling.       Legs:     Comments: Tenderness as marked posterior right thigh.   No ecchymosis, edema appreciated.  Right Hip: Slight limp.  Full ROM with flexion and hip rotation in flexion.   pain of lateral hip with  (flexion-abduction-external rotation) test.   No pain with deep palpation of greater trochanter.    Skin:    General:  Skin is warm and dry.  Neurological:     Mental Status: She is alert.  Psychiatric:        Speech: Speech normal.        Behavior: Behavior normal.        Thought Content: Thought content normal.        Assessment & Plan:   Problem List Items Addressed This Visit      Other   Anxiety and depression    Anxiety of late as she approaches resigning from work.  Long discussion regard from  this.  No changes made today however will follow.       Right thigh pain - Primary    Improving. Suspect musculoskeletal strain, sprain.  Pending xr imaging today.  Patient will let me know if does not continue to improve      Relevant Orders   DG FEMUR, MIN 2 VIEWS RIGHT   DG HIP UNILAT W OR W/O PELVIS 2-3 VIEWS RIGHT     Of note, long discussion with patient about financial assistance with [facility], have also messaged our pharmacist in regards to getting medications most affordable for her.  Emphasized the importance of her staying on her medications and how we would work with her as she does not need to stop them when she loses insurance.  She verbalized understanding of this.   I am having Anne Ng "Maudie Mercury" maintain her albuterol, fluticasone, triamcinolone, lidocaine, Turmeric, cyclobenzaprine, norethindrone, metFORMIN, rosuvastatin, gabapentin, lisinopril, albuterol, amLODipine, meloxicam, omega-3 acid ethyl esters, and DULoxetine.   No orders of the defined types were placed in this encounter.   Return precautions given.   Risks, benefits, and alternatives of the medications and treatment plan prescribed today were discussed, and patient expressed understanding.   Education regarding symptom management and diagnosis given to patient on AVS.  Continue to follow with Burnard Hawthorne, FNP for routine health maintenance.   Anne Ng and I agreed with plan.   Mable Paris, FNP

## 2018-03-31 NOTE — Patient Instructions (Addendum)
May still come here if you are self pay, and we give very discounted prices.    I want you to stay on your medications- this is VERY important.   Please look for a phone for [place] regarding options for affordable medications from our pharmacist   Let me know if you would like to pursue imaging, and if pain certainly does not improve conservatively as we discussed today.   Thinking of you, let me know if there is anything I can do for you

## 2018-04-02 ENCOUNTER — Ambulatory Visit: Payer: 59 | Admitting: Family

## 2018-04-04 ENCOUNTER — Ambulatory Visit: Payer: 59 | Admitting: Family

## 2018-04-04 NOTE — Progress Notes (Signed)
Patient has decided to pursue LTD for now with employer. She will have insurance through them.

## 2018-04-07 ENCOUNTER — Encounter: Payer: Self-pay | Admitting: Family

## 2018-04-09 NOTE — Telephone Encounter (Signed)
Pt requesting a call back from Sarah. 

## 2018-04-11 ENCOUNTER — Telehealth: Payer: Self-pay

## 2018-04-11 ENCOUNTER — Telehealth: Payer: Self-pay | Admitting: Family

## 2018-04-11 NOTE — Telephone Encounter (Signed)
Lm that I did not have any paperwork from [employer] as of now. I also advised in message that we do not do LTD paperwork I this office & she may need to refer to ortho for this? I stated that she could call back or mychart with questions. Otherwise, we would see her 04/14/18.

## 2018-04-11 NOTE — Telephone Encounter (Signed)
Patient called to speak with Judson Roch.  Judson Roch was not available and patient asked to tell her to fax over the [facility] group paperwork and when patient comes  In next week they could talk about [place].  Please advise and call patient if you have any questions.  CB# [phone].

## 2018-04-11 NOTE — Telephone Encounter (Signed)
LMTCB if needed an appointment.I advised that we received paperwork & I was filling it out. When completed we would fax.

## 2018-04-11 NOTE — Telephone Encounter (Signed)
Call patient We do not do long-term disability claims from our office.  She may contact social security adMinistration. Her  Orthopedic may complete for her , and she will call their office as well. Please let her know that unless she needs  paperwork faxed to [place], we can shred

## 2018-04-14 ENCOUNTER — Ambulatory Visit: Payer: 59 | Admitting: Family

## 2018-04-19 ENCOUNTER — Other Ambulatory Visit: Payer: Self-pay | Admitting: Family

## 2018-04-30 ENCOUNTER — Other Ambulatory Visit: Payer: Self-pay | Admitting: Family

## 2018-04-30 DIAGNOSIS — G8929 Other chronic pain: Secondary | ICD-10-CM

## 2018-04-30 DIAGNOSIS — M545 Low back pain, unspecified: Secondary | ICD-10-CM

## 2018-04-30 DIAGNOSIS — M25561 Pain in right knee: Secondary | ICD-10-CM

## 2018-04-30 DIAGNOSIS — M5442 Lumbago with sciatica, left side: Secondary | ICD-10-CM

## 2018-04-30 DIAGNOSIS — M25562 Pain in left knee: Secondary | ICD-10-CM

## 2018-05-15 ENCOUNTER — Other Ambulatory Visit: Payer: Self-pay

## 2018-05-15 DIAGNOSIS — D72829 Elevated white blood cell count, unspecified: Secondary | ICD-10-CM

## 2018-05-19 ENCOUNTER — Inpatient Hospital Stay: Payer: Self-pay

## 2018-05-19 ENCOUNTER — Inpatient Hospital Stay: Payer: Self-pay | Admitting: Oncology

## 2018-10-19 ENCOUNTER — Encounter: Payer: Self-pay | Admitting: Family

## 2018-10-23 NOTE — Telephone Encounter (Signed)
The regular flu shot $98.00 ,pneumonia vaccine $ 255.00.

## 2019-06-22 ENCOUNTER — Ambulatory Visit: Payer: Self-pay | Admitting: Family

## 2019-06-26 ENCOUNTER — Ambulatory Visit (INDEPENDENT_AMBULATORY_CARE_PROVIDER_SITE_OTHER): Payer: Self-pay | Admitting: Family

## 2019-06-26 ENCOUNTER — Ambulatory Visit (INDEPENDENT_AMBULATORY_CARE_PROVIDER_SITE_OTHER): Payer: Self-pay

## 2019-06-26 ENCOUNTER — Telehealth: Payer: Self-pay

## 2019-06-26 ENCOUNTER — Encounter: Payer: Self-pay | Admitting: Family

## 2019-06-26 ENCOUNTER — Other Ambulatory Visit: Payer: Self-pay

## 2019-06-26 VITALS — BP 130/80 | HR 97 | Temp 97.9°F | Ht 62.0 in | Wt 240.0 lb

## 2019-06-26 DIAGNOSIS — I10 Essential (primary) hypertension: Secondary | ICD-10-CM

## 2019-06-26 DIAGNOSIS — M545 Low back pain: Secondary | ICD-10-CM

## 2019-06-26 DIAGNOSIS — G8929 Other chronic pain: Secondary | ICD-10-CM

## 2019-06-26 DIAGNOSIS — E118 Type 2 diabetes mellitus with unspecified complications: Secondary | ICD-10-CM

## 2019-06-26 LAB — CBC WITH DIFFERENTIAL/PLATELET
Basophils Absolute: 0.1 10*3/uL (ref 0.0–0.1)
Basophils Relative: 0.8 % (ref 0.0–3.0)
Eosinophils Absolute: 0.2 10*3/uL (ref 0.0–0.7)
Eosinophils Relative: 2.2 % (ref 0.0–5.0)
HCT: 37.8 % (ref 36.0–46.0)
Hemoglobin: 11.9 g/dL — ABNORMAL LOW (ref 12.0–15.0)
Lymphocytes Relative: 31.8 % (ref 12.0–46.0)
Lymphs Abs: 3.2 10*3/uL (ref 0.7–4.0)
MCHC: 31.3 g/dL (ref 30.0–36.0)
MCV: 79.8 fl (ref 78.0–100.0)
Monocytes Absolute: 0.7 10*3/uL (ref 0.1–1.0)
Monocytes Relative: 7.1 % (ref 3.0–12.0)
Neutro Abs: 5.9 10*3/uL (ref 1.4–7.7)
Neutrophils Relative %: 58.1 % (ref 43.0–77.0)
Platelets: 316 10*3/uL (ref 150.0–400.0)
RBC: 4.74 Mil/uL (ref 3.87–5.11)
RDW: 14 % (ref 11.5–15.5)
WBC: 10.2 10*3/uL (ref 4.0–10.5)

## 2019-06-26 LAB — COMPREHENSIVE METABOLIC PANEL
ALT: 18 U/L (ref 0–35)
AST: 18 U/L (ref 0–37)
Albumin: 4.4 g/dL (ref 3.5–5.2)
Alkaline Phosphatase: 83 U/L (ref 39–117)
BUN: 16 mg/dL (ref 6–23)
CO2: 26 mEq/L (ref 19–32)
Calcium: 9.8 mg/dL (ref 8.4–10.5)
Chloride: 105 mEq/L (ref 96–112)
Creatinine, Ser: 1.05 mg/dL (ref 0.40–1.20)
GFR: 68.12 mL/min (ref >60.00)
Glucose, Bld: 116 mg/dL — ABNORMAL HIGH (ref 70–99)
Potassium: 4.1 mEq/L (ref 3.5–5.1)
Sodium: 138 mEq/L (ref 135–145)
Total Bilirubin: 0.3 mg/dL (ref 0.2–1.2)
Total Protein: 8.1 g/dL (ref 6.0–8.3)

## 2019-06-26 LAB — LIPID PANEL
Cholesterol: 212 mg/dL — ABNORMAL HIGH (ref 0–200)
HDL: 52.3 mg/dL (ref >39.00)
LDL Cholesterol: 138 mg/dL — ABNORMAL HIGH (ref 0–99)
NonHDL: 159.61
Total CHOL/HDL Ratio: 4
Triglycerides: 110 mg/dL (ref 0.0–149.0)
VLDL: 22 mg/dL (ref 0.0–40.0)

## 2019-06-26 LAB — VITAMIN D 25 HYDROXY (VIT D DEFICIENCY, FRACTURES): VITD: 28.57 ng/mL — ABNORMAL LOW (ref 30.00–100.00)

## 2019-06-26 LAB — MICROALBUMIN / CREATININE URINE RATIO
Creatinine,U: 146.6 mg/dL
Microalb Creat Ratio: 0.8 mg/g (ref 0.0–30.0)
Microalb, Ur: 1.1 mg/dL (ref 0.0–1.9)

## 2019-06-26 LAB — TSH: TSH: 3.22 u[IU]/mL (ref 0.35–4.50)

## 2019-06-26 LAB — HEMOGLOBIN A1C: Hgb A1c MFr Bld: 7.3 % — ABNORMAL HIGH (ref 4.6–6.5)

## 2019-06-26 NOTE — Assessment & Plan Note (Signed)
Improved as patient rested in exam room. Suspect pain very much contributory. Emphasized the importance of closer follow up and monitoring bp at home as we can adjust medications if needed. F/u 3 months.

## 2019-06-26 NOTE — Telephone Encounter (Signed)
LMTCB for lab results & need to know if she [place] okay with increasing metformin as well as starting Crestor.

## 2019-06-26 NOTE — Assessment & Plan Note (Signed)
Unchanged to worsened. Discussed patients poor quality of life and advised that we consult neurosurgery and pain management. She declines pain management referral at this time however willing to see Dr Annette Stable again. Referral placed. Close follow up.

## 2019-06-26 NOTE — Progress Notes (Signed)
Subjective:    Patient ID: Sandra Gray, female    DOB: Aug 01, 1970, 49 y.o.   MRN: [id number]  CC: Sandra Gray is a 49 y.o. female who presents today for follow up.   HPI: Continues to low back pain , more left sided. This pain has been on going since 2018. Working with an Forensic psychologist in regards disability. Taking cymbalta, gabapentin, flexeril with short term relief. No constipation, dysuria.   Using walker.   Resigned from work 04/2019.  Follows with GYN, Dr Glo Herring.    HTN- compliant with medications. Doesn't check blood pressure at home. Denies exertional chest pain or pressure, numbness or tingling radiating to left arm or jaw, palpitations, dizziness, frequent headaches, changes in vision, or shortness of breath.    DM- compliant with medication  Due for pap, mammogram   MRI lumbar spine 05/2016.  Consulted with Dr Sharlet Salina [phone].  Mammogram Due  HISTORY:  Past Medical History:  Diagnosis Date  . Asthma 2008  . Bronchitis   . Chronic kidney disease    "declining kidney, but it's stable right now"  . Fibroids   . Hypertension 2013   Started on lisinopril  . Pneumonia    Past Surgical History:  Procedure Laterality Date  . DILATION AND CURETTAGE OF UTERUS  08/2011  . HYSTEROSCOPY  02/15/2012   Fibroid Removal  . HYSTEROSCOPY  may 2014  . ROBOTIC ASSISTED SALPINGO OOPHERECTOMY Bilateral 07/28/2015   Procedure: XI ROBOTIC ASSISTED BILATERAL SALPINGO OOPHORECTOMY AND LYSIS OF ADHESIONS;  Surgeon: Everitt Amber, MD;  Location: [facility];  Service: Gynecology;  Laterality: Bilateral;  . SUPRACERVICAL ABDOMINAL HYSTERECTOMY N/A 08/31/2014   Procedure: HYSTERECTOMY SUPRACERVICAL ABDOMINAL;  Surgeon: [name], MD;  Location: [facility];  Service: Gynecology;  Laterality: N/A;  Dr. Elonda Husky will assist   Family History  Problem Relation Age of Onset  . Hypertension Mother   . Hypertension Father   . Diabetes Father   . Kidney disease Maternal Grandmother   . Cancer  Maternal Grandfather        Lung - Asbestos exposure  . Diabetes Paternal Grandmother   . Heart disease Paternal Grandfather   . Breast cancer Neg Hx     Allergies: Gadolinium derivatives Current Outpatient Medications on File Prior to Visit  Medication Sig Dispense Refill  . albuterol (PROVENTIL HFA;VENTOLIN HFA) 108 (90 Base) MCG/ACT inhaler Inhale 2 puffs into the lungs every 6 (six) hours as needed for wheezing. 1 Inhaler 2  . albuterol (PROVENTIL) (2.5 MG/3ML) 0.083% nebulizer solution Take 3 mLs (2.5 mg total) by nebulization every 6 (six) hours as needed for wheezing or shortness of breath. 150 mL 1  . amLODipine (NORVASC) 5 MG tablet TAKE 1 TABLET BY MOUTH  DAILY 90 tablet 1  . cyclobenzaprine (FLEXERIL) 5 MG tablet Take 1 tablet (5 mg total) by mouth at bedtime as needed for muscle spasms. 90 tablet 0  . DULoxetine (CYMBALTA) 60 MG capsule Take 1 capsule (60 mg total) by mouth daily. 90 capsule 2  . fluticasone (FLOVENT HFA) 110 MCG/ACT inhaler Inhale 1 puff into the lungs 2 (two) times daily. 1 Inhaler 0  . gabapentin (NEURONTIN) 100 MG capsule TAKE 3 CAPSULES BY MOUTH AT BEDTIME 270 capsule 0  . lidocaine (LIDODERM) 5 % UNWRAP AND APPLY 1 PATCH TO SKIN DAILY, REMOVE AND DISCARD PATCH WITHIN 12 HOURS 30 patch 1  . lisinopril (PRINIVIL,ZESTRIL) 5 MG tablet TAKE 1 TABLET BY MOUTH  DAILY 90 tablet 0  . meloxicam (MOBIC)  7.5 MG tablet TAKE 1 TABLET BY MOUTH DAILY AS NEEDED FOR PAIN 90 tablet 0  . metFORMIN (GLUCOPHAGE XR) 500 MG 24 hr tablet Start 500mg  PO qpm. 90 tablet 3  . norethindrone (AYGESTIN) 5 MG tablet TAKE 1 TABLET BY MOUTH  DAILY 90 tablet 1  . omega-3 acid ethyl esters (LOVAZA) 1 g capsule Take 1 capsule by mouth.    . rosuvastatin (CRESTOR) 5 MG tablet Take 1 tablet (5 mg total) by mouth daily. 90 tablet 3  . triamcinolone (KENALOG) 0.025 % cream Apply 1 application topically 2 (two) times daily. 80 g 1  . Turmeric 500 MG TABS Take by mouth daily.     No current  facility-administered medications on file prior to visit.    Social History   Tobacco Use  . Smoking status: Never Smoker  . Smokeless tobacco: Never Used  Substance Use Topics  . Alcohol use: Yes    Alcohol/week: 1.0 standard drinks    Types: 1 Standard drinks or equivalent per week    Comment: Rarely  . Drug use: No    Review of Systems  Constitutional: Negative for chills and fever.  Respiratory: Negative for cough.   Cardiovascular: Negative for chest pain and palpitations.  Gastrointestinal: Negative for nausea and vomiting.  Musculoskeletal: Positive for back pain and gait problem.  Neurological: Positive for weakness (lower extremities). Negative for numbness.      Objective:    BP 130/80 (BP Location: Left Arm, Cuff Size: Large)   Pulse 97   Temp 97.9 F (36.6 C) (Temporal)   Ht 5\' 2"  (1.575 m)   Wt 240 lb (108.9 kg)   LMP 08/02/2014   SpO2 98%   BMI 43.90 kg/m  BP Readings from Last 3 Encounters:  06/26/19 130/80  03/31/18 138/74  02/28/18 106/78   Wt Readings from Last 3 Encounters:  06/26/19 240 lb (108.9 kg)  03/31/18 220 lb 8 oz (100 kg)  02/28/18 217 lb 9.6 oz (98.7 kg)    Physical Exam Vitals reviewed.  Constitutional:      Appearance: She is well-developed.  Eyes:     Conjunctiva/sclera: Conjunctivae normal.  Cardiovascular:     Rate and Rhythm: Normal rate and regular rhythm.     Pulses: Normal pulses.     Heart sounds: Normal heart sounds.  Pulmonary:     Effort: Pulmonary effort is normal.     Breath sounds: Normal breath sounds. No wheezing, rhonchi or rales.  Musculoskeletal:     Thoracic back: No edema, signs of trauma or tenderness. Normal range of motion.     Lumbar back: Tenderness present. No swelling, edema, spasms or bony tenderness. Normal range of motion.       Back:     Comments: Unable to assess range of motion due to patient's discomfort today.  No bony tenderness. Pain with palpation over left lower back as marked on  diagram.  No pain, numbness, tingling elicited with single leg raise bilaterally.   Skin:    General: Skin is warm and dry.  Neurological:     Mental Status: She is alert.     Sensory: No sensory deficit.     Deep Tendon Reflexes:     Reflex Scores:      Patellar reflexes are 2+ on the right side and 2+ on the left side.    Comments: Sensation and strength intact bilateral lower extremities.  Psychiatric:        Speech: Speech normal.  Behavior: Behavior normal.        Thought Content: Thought content normal.        Assessment & Plan:   Problem List Items Addressed This Visit      Cardiovascular and Mediastinum   Hypertension (Chronic)    Improved as patient rested in exam room. Suspect pain very much contributory. Emphasized the importance of closer follow up and monitoring bp at home as we can adjust medications if needed. F/u 3 months.         Endocrine   Type 2 diabetes mellitus with complication, without long-term current use of insulin ([place])    Lost to follow up. Pending a1c      Relevant Orders   TSH   CBC with Differential/Platelet   Comprehensive metabolic panel   Hemoglobin A1c   VITAMIN D 25 Hydroxy (Vit-D Deficiency, Fractures)   Lipid panel   Microalbumin / creatinine urine ratio   MM 3D SCREEN BREAST BILATERAL     Other   Chronic low back pain - Primary    Unchanged to worsened. Discussed patients poor quality of life and advised that we consult neurosurgery and pain management. She declines pain management referral at this time however willing to see Dr Annette Stable again. Referral placed. Close follow up.       Relevant Orders   DG Thoracic Spine W/Swimmers   DG Lumbar Spine Complete   Ambulatory referral to Neurosurgery     Note: patient will call GYN to schedule f/u and PAP ;she will also call to schedule mammogram.   I am having Anne Ng "Maudie Mercury" maintain her albuterol, fluticasone, triamcinolone, lidocaine, Turmeric, cyclobenzaprine,  norethindrone, metFORMIN, rosuvastatin, albuterol, amLODipine, omega-3 acid ethyl esters, DULoxetine, lisinopril, meloxicam, and gabapentin.   No orders of the defined types were placed in this encounter.   Return precautions given.   Risks, benefits, and alternatives of the medications and treatment plan prescribed today were discussed, and patient expressed understanding.   Education regarding symptom management and diagnosis given to patient on AVS.  Continue to follow with Burnard Hawthorne, FNP for routine health maintenance.   Anne Ng and I agreed with plan.   Mable Paris, FNP

## 2019-06-26 NOTE — Assessment & Plan Note (Signed)
Lost to follow up. Pending a1c

## 2019-06-26 NOTE — Patient Instructions (Addendum)
Please call Dr Glo Herring to schedule follow up in regards to your pap smear.   Referral to Dr Earnie Larsson.  Let us know if you dont hear back within a week in regards to an appointment being scheduled.   It is imperative that you are seen AT least twice per year for labs and monitoring. Monitor blood pressure at home and me 5-6 reading on separate days. Goal is less than 120/80, based on newest guidelines, however we certainly want to be less than 130/80;  if persistently higher, please make sooner follow up appointment so we can recheck you blood pressure and manage/ adjust medications.  We need to maintain closer follow up in regards to her blood sugar, hypertension.  Please resume follow-up with me every 3 months

## 2019-06-29 ENCOUNTER — Encounter: Payer: Self-pay | Admitting: Family

## 2019-06-29 ENCOUNTER — Other Ambulatory Visit: Payer: Self-pay

## 2019-06-29 DIAGNOSIS — E118 Type 2 diabetes mellitus with unspecified complications: Secondary | ICD-10-CM

## 2019-06-29 DIAGNOSIS — E785 Hyperlipidemia, unspecified: Secondary | ICD-10-CM

## 2019-08-11 ENCOUNTER — Telehealth: Payer: Self-pay | Admitting: *Deleted

## 2019-08-11 DIAGNOSIS — E785 Hyperlipidemia, unspecified: Secondary | ICD-10-CM

## 2019-08-11 NOTE — Telephone Encounter (Signed)
Please place future orders for lab appt.   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

## 2019-08-14 ENCOUNTER — Other Ambulatory Visit: Payer: Self-pay

## 2019-08-14 ENCOUNTER — Other Ambulatory Visit (INDEPENDENT_AMBULATORY_CARE_PROVIDER_SITE_OTHER): Payer: Self-pay

## 2019-08-14 DIAGNOSIS — E785 Hyperlipidemia, unspecified: Secondary | ICD-10-CM

## 2019-08-14 LAB — HEPATIC FUNCTION PANEL
ALT: 17 U/L (ref 0–35)
AST: 15 U/L (ref 0–37)
Albumin: 4.4 g/dL (ref 3.5–5.2)
Alkaline Phosphatase: 83 U/L (ref 39–117)
Bilirubin, Direct: 0.1 mg/dL (ref 0.0–0.3)
Total Bilirubin: 0.3 mg/dL (ref 0.2–1.2)
Total Protein: 8.4 g/dL — ABNORMAL HIGH (ref 6.0–8.3)

## 2019-08-14 LAB — COMPREHENSIVE METABOLIC PANEL
ALT: 17 U/L (ref 0–35)
AST: 15 U/L (ref 0–37)
Albumin: 4.4 g/dL (ref 3.5–5.2)
Alkaline Phosphatase: 83 U/L (ref 39–117)
BUN: 20 mg/dL (ref 6–23)
CO2: 25 mEq/L (ref 19–32)
Calcium: 10.1 mg/dL (ref 8.4–10.5)
Chloride: 106 mEq/L (ref 96–112)
Creatinine, Ser: 1.22 mg/dL — ABNORMAL HIGH (ref 0.40–1.20)
GFR: 57.26 mL/min — ABNORMAL LOW (ref >60.00)
Glucose, Bld: 126 mg/dL — ABNORMAL HIGH (ref 70–99)
Potassium: 4.5 mEq/L (ref 3.5–5.1)
Sodium: 140 mEq/L (ref 135–145)
Total Bilirubin: 0.3 mg/dL (ref 0.2–1.2)
Total Protein: 8.4 g/dL — ABNORMAL HIGH (ref 6.0–8.3)

## 2019-08-20 ENCOUNTER — Other Ambulatory Visit: Payer: Self-pay | Admitting: Family

## 2019-08-20 ENCOUNTER — Encounter: Payer: Self-pay | Admitting: Family

## 2019-08-20 DIAGNOSIS — E118 Type 2 diabetes mellitus with unspecified complications: Secondary | ICD-10-CM

## 2019-08-20 MED ORDER — ROSUVASTATIN CALCIUM 10 MG PO TABS: 10.0000 mg | ORAL_TABLET | Freq: Every evening | ORAL | 3 refills | Status: DC

## 2019-08-20 NOTE — Telephone Encounter (Signed)
Ok to refill? Medication last refilled in 2019.

## 2019-08-20 NOTE — Telephone Encounter (Signed)
Pt called to inquire, Please call when completed

## 2019-08-24 ENCOUNTER — Other Ambulatory Visit: Payer: Self-pay

## 2019-08-24 ENCOUNTER — Other Ambulatory Visit: Payer: Self-pay | Admitting: Family

## 2019-08-24 DIAGNOSIS — E118 Type 2 diabetes mellitus with unspecified complications: Secondary | ICD-10-CM

## 2019-08-24 DIAGNOSIS — R779 Abnormality of plasma protein, unspecified: Secondary | ICD-10-CM

## 2019-08-24 MED ORDER — METFORMIN HCL ER 500 MG PO TB24: ORAL_TABLET | ORAL | 1 refills | Status: DC

## 2019-08-24 MED ORDER — AMLODIPINE BESYLATE 5 MG PO TABS: 5.0000 mg | ORAL_TABLET | Freq: Every day | ORAL | 1 refills | Status: DC

## 2019-09-01 ENCOUNTER — Telehealth: Payer: Self-pay | Admitting: Family

## 2019-09-01 NOTE — Telephone Encounter (Signed)
Call patient Received documentation for patient's attorney, Sandra Gray in regards to long-term disability. We do not complete forms for this however she may certainly call [facility] medical records and request all office visit notes which detail her chronic pain.

## 2019-09-01 NOTE — Telephone Encounter (Signed)
LMTCB

## 2019-09-01 NOTE — Telephone Encounter (Signed)
Pt was returning call 

## 2019-09-02 NOTE — Telephone Encounter (Signed)
Spoke with pt to let her know that our office does not fill out long term disability forms but that she can call [place] medical records and request her medical records.

## 2019-09-10 ENCOUNTER — Other Ambulatory Visit: Payer: Self-pay | Admitting: Neurosurgery

## 2019-09-10 ENCOUNTER — Other Ambulatory Visit (HOSPITAL_COMMUNITY): Payer: Self-pay | Admitting: Neurosurgery

## 2019-09-10 DIAGNOSIS — M546 Pain in thoracic spine: Secondary | ICD-10-CM | POA: Diagnosis not present

## 2019-09-10 DIAGNOSIS — M5137 Other intervertebral disc degeneration, lumbosacral region: Secondary | ICD-10-CM | POA: Diagnosis not present

## 2019-09-15 ENCOUNTER — Other Ambulatory Visit: Payer: Self-pay

## 2019-09-15 ENCOUNTER — Encounter: Payer: Self-pay | Admitting: Family

## 2019-09-15 MED ORDER — LISINOPRIL 5 MG PO TABS: 5.0000 mg | ORAL_TABLET | Freq: Every day | ORAL | 0 refills | Status: DC

## 2019-09-15 NOTE — Telephone Encounter (Signed)
Refilled Lisinopril for patient. I do not see the cholecalciferol on the patients chart.

## 2019-09-16 ENCOUNTER — Other Ambulatory Visit: Payer: Self-pay

## 2019-09-16 ENCOUNTER — Other Ambulatory Visit: Payer: Self-pay | Admitting: Family

## 2019-09-16 DIAGNOSIS — E559 Vitamin D deficiency, unspecified: Secondary | ICD-10-CM

## 2019-09-16 MED ORDER — CHOLECALCIFEROL 10 MCG (400 UNIT) PO CAPS: 800.0000 [IU] | ORAL_CAPSULE | Freq: Every day | ORAL | 1 refills | Status: DC

## 2019-09-16 MED ORDER — LISINOPRIL 5 MG PO TABS: 5.0000 mg | ORAL_TABLET | Freq: Every day | ORAL | 0 refills | Status: DC

## 2019-09-16 NOTE — Telephone Encounter (Signed)
Sandra Gray sent in a message stating that she goes to [facility] and not [facility] for her medication. Called [facility] in [place] and cancelled Lisinopril and Sent medication to the [facility] on patients chart.

## 2019-09-19 ENCOUNTER — Other Ambulatory Visit: Payer: Self-pay

## 2019-09-19 ENCOUNTER — Ambulatory Visit (HOSPITAL_COMMUNITY)
Admission: RE | Admit: 2019-09-19 | Discharge: 2019-09-19 | Disposition: A | Discharge status: Home / Self Care | Payer: Medicaid Other | Source: Ambulatory Visit | Attending: Neurosurgery | Admitting: Neurosurgery

## 2019-09-19 DIAGNOSIS — M546 Pain in thoracic spine: Secondary | ICD-10-CM | POA: Insufficient documentation

## 2019-09-23 ENCOUNTER — Other Ambulatory Visit: Payer: Self-pay

## 2019-09-23 ENCOUNTER — Other Ambulatory Visit (INDEPENDENT_AMBULATORY_CARE_PROVIDER_SITE_OTHER): Payer: Self-pay

## 2019-09-23 DIAGNOSIS — E8809 Other disorders of plasma-protein metabolism, not elsewhere classified: Secondary | ICD-10-CM

## 2019-09-23 DIAGNOSIS — R779 Abnormality of plasma protein, unspecified: Secondary | ICD-10-CM

## 2019-09-25 LAB — IFE AND PE, RANDOM URINE
% BETA, Urine: 0 %
ALBUMIN, U: 100 %
ALPHA 1 URINE: 0 %
ALPHA-2-GLOBULIN, U: 0 %
GAMMA GLOBULIN URINE: 0 %
Protein, Ur: 12.6 mg/dL

## 2019-09-28 LAB — MULTIPLE MYELOMA PANEL, SERUM
Albumin SerPl Elph-Mcnc: 4.1 g/dL (ref 2.9–4.4)
Albumin/Glob SerPl: 1.1 (ref 0.7–1.7)
Alpha 1: 0.2 g/dL (ref 0.0–0.4)
Alpha2 Glob SerPl Elph-Mcnc: 0.7 g/dL (ref 0.4–1.0)
B-Globulin SerPl Elph-Mcnc: 1.2 g/dL (ref 0.7–1.3)
Gamma Glob SerPl Elph-Mcnc: 1.7 g/dL (ref 0.4–1.8)
Globulin, Total: 3.8 g/dL (ref 2.2–3.9)
IgA/Immunoglobulin A, Serum: 392 mg/dL — ABNORMAL HIGH (ref 87–352)
IgG (Immunoglobin G), Serum: 1650 mg/dL — ABNORMAL HIGH (ref 586–1602)
IgM (Immunoglobulin M), Srm: 92 mg/dL (ref 26–217)

## 2019-09-28 LAB — PE AND FLC, SERUM
Ig Kappa Free Light Chain: 38.8 mg/L — ABNORMAL HIGH (ref 3.3–19.4)
Ig Lambda Free Light Chain: 22.7 mg/L (ref 5.7–26.3)
KAPPA/LAMBDA RATIO: 1.71 — ABNORMAL HIGH (ref 0.26–1.65)
Total Protein: 7.9 g/dL (ref 6.0–8.5)

## 2019-09-29 ENCOUNTER — Encounter: Payer: Self-pay | Admitting: Family

## 2019-10-02 ENCOUNTER — Other Ambulatory Visit: Payer: Self-pay | Admitting: Family

## 2019-10-02 DIAGNOSIS — R899 Unspecified abnormal finding in specimens from other organs, systems and tissues: Secondary | ICD-10-CM

## 2019-10-27 ENCOUNTER — Encounter: Payer: Self-pay | Admitting: Family

## 2019-10-27 ENCOUNTER — Telehealth (INDEPENDENT_AMBULATORY_CARE_PROVIDER_SITE_OTHER): Payer: Self-pay | Admitting: Family

## 2019-10-27 ENCOUNTER — Telehealth: Payer: Self-pay | Admitting: Family

## 2019-10-27 ENCOUNTER — Other Ambulatory Visit: Payer: Self-pay

## 2019-10-27 VITALS — Ht 62.0 in | Wt 240.0 lb

## 2019-10-27 DIAGNOSIS — G8929 Other chronic pain: Secondary | ICD-10-CM

## 2019-10-27 DIAGNOSIS — I10 Essential (primary) hypertension: Secondary | ICD-10-CM

## 2019-10-27 DIAGNOSIS — F32A Depression, unspecified: Secondary | ICD-10-CM

## 2019-10-27 DIAGNOSIS — F419 Anxiety disorder, unspecified: Secondary | ICD-10-CM

## 2019-10-27 DIAGNOSIS — E118 Type 2 diabetes mellitus with unspecified complications: Secondary | ICD-10-CM

## 2019-10-27 DIAGNOSIS — M5104 Intervertebral disc disorders with myelopathy, thoracic region: Secondary | ICD-10-CM | POA: Diagnosis not present

## 2019-10-27 DIAGNOSIS — M545 Low back pain: Secondary | ICD-10-CM

## 2019-10-27 DIAGNOSIS — E785 Hyperlipidemia, unspecified: Secondary | ICD-10-CM

## 2019-10-27 DIAGNOSIS — M5442 Lumbago with sciatica, left side: Secondary | ICD-10-CM

## 2019-10-27 DIAGNOSIS — F329 Major depressive disorder, single episode, unspecified: Secondary | ICD-10-CM

## 2019-10-27 MED ORDER — DULOXETINE HCL 60 MG PO CPEP: 60.0000 mg | ORAL_CAPSULE | Freq: Every day | ORAL | 2 refills | Status: DC

## 2019-10-27 NOTE — Assessment & Plan Note (Signed)
Lab Results  Component Value Date   HGBA1C 7.3 (H) 06/26/2019   Pending a1c, suspect improved.

## 2019-10-27 NOTE — Patient Instructions (Addendum)
Restart cymbalta.   You need labs, urine in 1-2 weeks.   Lab work shows diminished kidney function which appears stable from you baseline 3 months ago. Most important to avoid all over the counter antiinflammatories ( such as Aleve, Motrin, ibuprofen etc) as they can worsen kidney function. Our goal is to preserve your kidney function going forward.You may take tylenol.   Stay safe.

## 2019-10-27 NOTE — Telephone Encounter (Signed)
I called Dr. Irven [place] office & spoke with Caryl Pina. She stated that she had tried to get patient in for an urgent appointment to discuss MRI. She told patient that since she [place] self pay she had two options. She has to pay $50 for the appointment regardless. Since she is self pay if she pays the full amount of visit day of she gets a 30% discount or she can be billed for full amount then set up payment plans. She stated that patient was reluctant & did not really want to come in office to discuss. She told me that if patient could come up with the $50 that she could get her in a soon as today. I told Caryl Pina that I would call patient to see if she was willing & could come up with the $50. Ashley's ext. to reach is [phone].   After speaking with patient & explaining payment plan she was willing to be seen. She asked if I could get her an appointment for tomorrow & she could get her dad to bring her as well as help her with the $50. I told patient I would call back after appointment was made.  I called back Dr. Irven [place] office to speak with Caryl Pina,  but was unable to reach. I have LMTCB.
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
## 2019-10-27 NOTE — Assessment & Plan Note (Addendum)
Overall controlled. Suspect pain contributory as well. Deferred making changes today as no recent blood pressure in office. Advised to make follow up in office.

## 2019-10-27 NOTE — Assessment & Plan Note (Signed)
Exacerbated by chronic pain, poor QOL. Advised to restart cymbalta. Close follow up.

## 2019-10-27 NOTE — Assessment & Plan Note (Signed)
Compliant with crestor, pending lipid panel

## 2019-10-27 NOTE — Telephone Encounter (Signed)
Call Dr Mallie Mussel Poole's office and speak with nurse [phone]  Per patient, she spoke with Caryl Pina ( ? CMA) and she was told she needed to pay $50 to come in to review significant changes, severe spinal cord impingement seen on MRI thoracic spine done in August.   She doesn't have the $50 however asked if she start a payment plan.   Bottom line is patient needs URGENT appt to discuss options as it relates to MRI thoracic spine and associated low back pain, fecal incontinence, leg weakness.   How soon can she be seen? Can we make an urgent appointment?

## 2019-10-27 NOTE — Assessment & Plan Note (Signed)
Very concern with changes seen on thoracic MRI and constellation of symptoms. We have made an urgent appointment for patient tomorrow with Dr Annette Stable at 230p and she is aware. Advised to restart cymbalta. She will continue gabapentin, flexeril. Will follow.

## 2019-10-27 NOTE — Telephone Encounter (Signed)
Patient is aware of appointment tomorrow & will be attending appointment with father.

## 2019-11-03 ENCOUNTER — Telehealth: Payer: Self-pay | Admitting: Family

## 2019-11-03 NOTE — Telephone Encounter (Signed)
Patient Disability Determination form requesting records for DOS 10/09/2016 to present. Faxed form to [place] in Medical Records to release medical records to the above agency.

## 2019-11-13 ENCOUNTER — Telehealth: Payer: Self-pay | Admitting: Family

## 2019-11-13 NOTE — Telephone Encounter (Signed)
Call pt Just wanted to check on her How did appt with dr pool go?  We had lab orders in and wanted to ensure she was scheduled for that as well as follow up with me  Please schedule

## 2019-11-13 NOTE — Telephone Encounter (Signed)
I called patient & she stated that she was hurting bc she had fallen. Her mom had taken her someplace for care? She said that she would call us back.
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
## 2019-11-20 ENCOUNTER — Other Ambulatory Visit: Payer: Self-pay

## 2019-11-20 DIAGNOSIS — S22000K Wedge compression fracture of unspecified thoracic vertebra, subsequent encounter for fracture with nonunion: Secondary | ICD-10-CM

## 2019-11-20 NOTE — Telephone Encounter (Signed)
Signed.

## 2019-11-20 NOTE — Telephone Encounter (Signed)
Call pt Certainly understand how she feels  As an office we do not complete LTD  I didn't realize that the wanted prescriptions for cane, walker That is absolutely fine and please write , I can sign.

## 2019-11-20 NOTE — Telephone Encounter (Signed)
I have printed DME for rolling walker with seat & placed on desk. If you sign I will mail to patient.

## 2019-11-20 NOTE — Telephone Encounter (Signed)
After speaking with patient she decided that she would f/u as well as have labs done. I have scheduled her for both as well as flu shot. Pt asked if I would mail DME for walker to her & have put in mail to be sent. Pt also did not need a walker bc she does currently have one.

## 2019-11-23 ENCOUNTER — Telehealth: Payer: Self-pay | Admitting: *Deleted

## 2019-11-23 ENCOUNTER — Ambulatory Visit: Payer: Self-pay

## 2019-11-23 ENCOUNTER — Other Ambulatory Visit: Payer: Self-pay | Admitting: Family

## 2019-11-23 ENCOUNTER — Other Ambulatory Visit: Payer: Self-pay

## 2019-11-23 ENCOUNTER — Other Ambulatory Visit (INDEPENDENT_AMBULATORY_CARE_PROVIDER_SITE_OTHER): Payer: Self-pay

## 2019-11-23 DIAGNOSIS — I10 Essential (primary) hypertension: Secondary | ICD-10-CM

## 2019-11-23 DIAGNOSIS — E785 Hyperlipidemia, unspecified: Secondary | ICD-10-CM

## 2019-11-23 DIAGNOSIS — E118 Type 2 diabetes mellitus with unspecified complications: Secondary | ICD-10-CM

## 2019-11-23 DIAGNOSIS — Z23 Encounter for immunization: Secondary | ICD-10-CM

## 2019-11-23 NOTE — Telephone Encounter (Signed)
FYI placed in red folder.

## 2019-11-23 NOTE — Telephone Encounter (Signed)
Reviewed; scanned to chart

## 2019-11-23 NOTE — Telephone Encounter (Signed)
Pt dropped off records from [place]. Placed in [place] in pod.

## 2019-11-23 NOTE — Addendum Note (Signed)
Addended by: Leeanne Rio on: 11/23/2019 02:55 PM   Modules accepted: Orders

## 2019-11-24 LAB — LIPID PANEL
Chol/HDL Ratio: 2.7 ratio (ref 0.0–4.4)
Cholesterol, Total: 158 mg/dL (ref 100–199)
HDL: 58 mg/dL (ref >39)
LDL Chol Calc (NIH): 80 mg/dL (ref 0–99)
Triglycerides: 111 mg/dL (ref 0–149)
VLDL Cholesterol Cal: 20 mg/dL (ref 5–40)

## 2019-11-24 LAB — COMPREHENSIVE METABOLIC PANEL
ALT: 17 IU/L (ref 0–32)
AST: 16 IU/L (ref 0–40)
Albumin/Globulin Ratio: 1.2 (ref 1.2–2.2)
Albumin: 4.5 g/dL (ref 3.8–4.8)
Alkaline Phosphatase: 93 IU/L (ref 44–121)
BUN/Creatinine Ratio: 11 (ref 9–23)
BUN: 13 mg/dL (ref 6–24)
Bilirubin Total: 0.2 mg/dL (ref 0.0–1.2)
CO2: 20 mmol/L (ref 20–29)
Calcium: 9.8 mg/dL (ref 8.7–10.2)
Chloride: 107 mmol/L — ABNORMAL HIGH (ref 96–106)
Creatinine, Ser: 1.16 mg/dL — ABNORMAL HIGH (ref 0.57–1.00)
GFR calc Af Amer: 65 mL/min/{1.73_m2} (ref >59)
GFR calc non Af Amer: 57 mL/min/{1.73_m2} — ABNORMAL LOW (ref >59)
Globulin, Total: 3.7 g/dL (ref 1.5–4.5)
Glucose: 84 mg/dL (ref 65–99)
Potassium: 4.4 mmol/L (ref 3.5–5.2)
Sodium: 144 mmol/L (ref 134–144)
Total Protein: 8.2 g/dL (ref 6.0–8.5)

## 2019-11-24 LAB — HEMOGLOBIN A1C
Est. average glucose Bld gHb Est-mCnc: 157 mg/dL
Hgb A1c MFr Bld: 7.1 % — ABNORMAL HIGH (ref 4.8–5.6)

## 2019-11-25 ENCOUNTER — Other Ambulatory Visit: Payer: Self-pay

## 2019-11-25 ENCOUNTER — Telehealth: Payer: Self-pay | Admitting: Family

## 2019-11-25 MED ORDER — LISINOPRIL 5 MG PO TABS
5.0000 mg | ORAL_TABLET | Freq: Every day | ORAL | 1 refills | Status: DC
Start: 2019-11-25 — End: 2019-12-29

## 2019-11-25 MED ORDER — ROSUVASTATIN CALCIUM 20 MG PO TABS: 20.0000 mg | ORAL_TABLET | Freq: Every day | ORAL | 3 refills | Status: DC

## 2019-11-25 NOTE — Telephone Encounter (Signed)
I have faxed a add on sheet to our lab. As long as they still have the blood they can send it to [place]

## 2019-11-25 NOTE — Telephone Encounter (Signed)
I spoke with patient & she stated that she would have to talk to her mother to see when she could bring her for labs that were not drawn.

## 2019-11-25 NOTE — Telephone Encounter (Signed)
For some reason urine labs were not drawn when patient came in for lab.  Are yall able to see those ordered from 10/02/19 ( there are 3 studies there)  Patient needs to be called and scheduled for urine. Sarah, please apologize to her greatly that these were missed.

## 2019-11-26 NOTE — Telephone Encounter (Signed)
I heard back from the lab & they do not have the blood anymore. These test were ordered on two different days. Unless we are told to draw them all we typically don't because it looked like duplicate tests were ordered since she just had it done in August.

## 2019-12-25 ENCOUNTER — Other Ambulatory Visit: Payer: Self-pay

## 2019-12-28 NOTE — Progress Notes (Signed)
Subjective:    Patient ID: Sandra Gray, female    DOB: 04-23-1970, 49 y.o.   MRN: [id number]  CC: Sandra Gray is a 49 y.o. female who presents today for follow up.   HPI: Feels well today No new concerns.    Continues to have low back pain, this unchanged.  No recent falls. Uses walker, cane.  Takes gabapentin 300mg  qhs, flexeril prn with relief.  Dr Annette Stable recommended surgery however she is considering this.   DM - a1c 7.1 one month ago. Compliant with metformin 500mg   HTN- compliant amlodipine, ran out of lisinopril 3 days ago. No cp, sob.   HLD- LDL 80 1 month ago; she isnt sure if she increased crestor to 20mg .    Due colonoscopy, pap smear, mammogram; patient declines at this time.    HISTORY:  Past Medical History:  Diagnosis Date  . Asthma 2008  . Bronchitis   . Chronic kidney disease    "declining kidney, but it's stable right now"  . Fibroids   . Hypertension 2013   Started on lisinopril  . Pneumonia    Past Surgical History:  Procedure Laterality Date  . DILATION AND CURETTAGE OF UTERUS  08/2011  . HYSTEROSCOPY  02/15/2012   Fibroid Removal  . HYSTEROSCOPY  may 2014  . ROBOTIC ASSISTED SALPINGO OOPHERECTOMY Bilateral 07/28/2015   Procedure: XI ROBOTIC ASSISTED BILATERAL SALPINGO OOPHORECTOMY AND LYSIS OF ADHESIONS;  Surgeon: Everitt Amber, MD;  Location: [facility];  Service: Gynecology;  Laterality: Bilateral;  . SUPRACERVICAL ABDOMINAL HYSTERECTOMY N/A 08/31/2014   Procedure: HYSTERECTOMY SUPRACERVICAL ABDOMINAL;  Surgeon: [name], MD;  Location: [facility];  Service: Gynecology;  Laterality: N/A;  Dr. Elonda Husky will assist   Family History  Problem Relation Age of Onset  . Hypertension Mother   . Hypertension Father   . Diabetes Father   . Kidney disease Maternal Grandmother   . Cancer Maternal Grandfather        Lung - Asbestos exposure  . Diabetes Paternal Grandmother   . Heart disease Paternal Grandfather   . Breast cancer Neg Hx      Allergies: Cymbalta [duloxetine hcl] and Gadolinium derivatives Current Outpatient Medications on File Prior to Visit  Medication Sig Dispense Refill  . albuterol (PROVENTIL) (2.5 MG/3ML) 0.083% nebulizer solution Take 3 mLs (2.5 mg total) by nebulization every 6 (six) hours as needed for wheezing or shortness of breath. 150 mL 1  . amLODipine (NORVASC) 5 MG tablet Take 1 tablet (5 mg total) by mouth daily. 90 tablet 1  . Cholecalciferol 10 MCG (400 UNIT) CAPS Take 2 capsules (800 Units total) by mouth daily. 120 capsule 1  . cyclobenzaprine (FLEXERIL) 5 MG tablet Take 1 tablet (5 mg total) by mouth at bedtime as needed for muscle spasms. 90 tablet 0  . fluticasone (FLOVENT HFA) 110 MCG/ACT inhaler Inhale 1 puff into the lungs 2 (two) times daily. 1 Inhaler 0  . gabapentin (NEURONTIN) 100 MG capsule TAKE 3 CAPSULES BY MOUTH AT BEDTIME 270 capsule 0  . lidocaine (LIDODERM) 5 % UNWRAP AND APPLY 1 PATCH TO SKIN DAILY, REMOVE AND DISCARD PATCH WITHIN 12 HOURS 30 patch 1  . rosuvastatin (CRESTOR) 20 MG tablet Take 1 tablet (20 mg total) by mouth daily. 90 tablet 3  . triamcinolone (KENALOG) 0.025 % cream Apply 1 application topically 2 (two) times daily. 80 g 1  . Turmeric 500 MG TABS Take by mouth daily.    Marland Kitchen albuterol (PROVENTIL HFA;VENTOLIN HFA) 108 (90  Base) MCG/ACT inhaler Inhale 2 puffs into the lungs every 6 (six) hours as needed for wheezing. (Patient not taking: Reported on 12/29/2019) 1 Inhaler 2   No current facility-administered medications on file prior to visit.    Social History   Tobacco Use  . Smoking status: Never Smoker  . Smokeless tobacco: Never Used  Substance Use Topics  . Alcohol use: Yes    Alcohol/week: 1.0 standard drink    Types: 1 Standard drinks or equivalent per week    Comment: Rarely  . Drug use: No    Review of Systems  Constitutional: Negative for chills and fever.  Respiratory: Negative for cough.   Cardiovascular: Negative for chest pain and  palpitations.  Gastrointestinal: Negative for nausea and vomiting.  Musculoskeletal: Positive for back pain (chronic).  Psychiatric/Behavioral: Negative for suicidal ideas. The patient is not nervous/anxious.       Objective:    BP 140/78   Pulse 97   Temp 98.6 F (37 C)   Ht 5\' 2"  (1.575 m)   Wt 236 lb 9.6 oz (107.3 kg)   LMP 08/02/2014   SpO2 97%   BMI 43.27 kg/m  BP Readings from Last 3 Encounters:  12/29/19 140/78  06/26/19 130/80  03/31/18 138/74   Wt Readings from Last 3 Encounters:  12/29/19 236 lb 9.6 oz (107.3 kg)  10/27/19 240 lb (108.9 kg)  06/26/19 240 lb (108.9 kg)    Physical Exam Vitals reviewed.  Constitutional:      Appearance: She is well-developed.  Eyes:     Conjunctiva/sclera: Conjunctivae normal.  Cardiovascular:     Rate and Rhythm: Normal rate and regular rhythm.     Pulses: Normal pulses.     Heart sounds: Normal heart sounds.  Pulmonary:     Effort: Pulmonary effort is normal.     Breath sounds: Normal breath sounds. No wheezing, rhonchi or rales.  Skin:    General: Skin is warm and dry.  Neurological:     Mental Status: She is alert.  Psychiatric:        Speech: Speech normal.        Behavior: Behavior normal.        Thought Content: Thought content normal.        Assessment & Plan:   Problem List Items Addressed This Visit      Cardiovascular and Mediastinum   Hypertension (Chronic)    Elevated today however patient ran out of lisinpril 5mg . She is compliant with amlodipine 5mg ,continue.         Endocrine   Type 2 diabetes mellitus with complication, without long-term current use of insulin (HCC)    Uncontrolled. Maximize metformin to aid with weight loss. Increase metformin to 2000mg  qhs. Close follow up.      Relevant Medications   metFORMIN (GLUCOPHAGE XR) 500 MG 24 hr tablet   Other Relevant Orders   Comprehensive metabolic panel     Other   Chronic low back pain    Chronic, unchanged. Unfortunately she had  side effects on cymbalta. She will continue gabapentin 300mg  qhs and flexeril 5mg  prn. Advised to let me know if she decides to pursue surgery.      Hyperlipidemia    Presume Improved on crestor 20mg  ; continue       Other Visit Diagnoses    Abnormal laboratory test           I have discontinued Anne Ng "Kim"'s DULoxetine. I have also changed her metFORMIN. Additionally,  I am having her maintain her albuterol, fluticasone, triamcinolone, lidocaine, Turmeric, cyclobenzaprine, albuterol, gabapentin, amLODipine, Cholecalciferol, and rosuvastatin.   Meds ordered this encounter  Medications  . metFORMIN (GLUCOPHAGE XR) 500 MG 24 hr tablet    Sig: Take 4 tablets (2,000 mg total) by mouth every evening.    Dispense:  120 tablet    Refill:  3    Order Specific Question:   Supervising Provider    Answer:   Crecencio Mc [[id number]]    Return precautions given.   Risks, benefits, and alternatives of the medications and treatment plan prescribed today were discussed, and patient expressed understanding.   Education regarding symptom management and diagnosis given to patient on AVS.  Continue to follow with Burnard Hawthorne, FNP for routine health maintenance.   Anne Ng and I agreed with plan.   Mable Paris, FNP

## 2019-12-29 ENCOUNTER — Other Ambulatory Visit: Payer: Self-pay

## 2019-12-29 ENCOUNTER — Ambulatory Visit (INDEPENDENT_AMBULATORY_CARE_PROVIDER_SITE_OTHER): Payer: Self-pay | Admitting: Family

## 2019-12-29 ENCOUNTER — Encounter: Payer: Self-pay | Admitting: Family

## 2019-12-29 DIAGNOSIS — I10 Essential (primary) hypertension: Secondary | ICD-10-CM

## 2019-12-29 DIAGNOSIS — G8929 Other chronic pain: Secondary | ICD-10-CM

## 2019-12-29 DIAGNOSIS — E785 Hyperlipidemia, unspecified: Secondary | ICD-10-CM

## 2019-12-29 DIAGNOSIS — R899 Unspecified abnormal finding in specimens from other organs, systems and tissues: Secondary | ICD-10-CM | POA: Diagnosis not present

## 2019-12-29 DIAGNOSIS — E118 Type 2 diabetes mellitus with unspecified complications: Secondary | ICD-10-CM

## 2019-12-29 DIAGNOSIS — M545 Low back pain, unspecified: Secondary | ICD-10-CM

## 2019-12-29 LAB — COMPREHENSIVE METABOLIC PANEL
ALT: 14 U/L (ref 0–35)
AST: 13 U/L (ref 0–37)
Albumin: 4.5 g/dL (ref 3.5–5.2)
Alkaline Phosphatase: 90 U/L (ref 39–117)
BUN: 13 mg/dL (ref 6–23)
CO2: 28 mEq/L (ref 19–32)
Calcium: 10 mg/dL (ref 8.4–10.5)
Chloride: 102 mEq/L (ref 96–112)
Creatinine, Ser: 1.12 mg/dL (ref 0.40–1.20)
GFR: 58.76 mL/min — ABNORMAL LOW (ref >60.00)
Glucose, Bld: 118 mg/dL — ABNORMAL HIGH (ref 70–99)
Potassium: 4.5 mEq/L (ref 3.5–5.1)
Sodium: 137 mEq/L (ref 135–145)
Total Bilirubin: 0.4 mg/dL (ref 0.2–1.2)
Total Protein: 8.3 g/dL (ref 6.0–8.3)

## 2019-12-29 MED ORDER — LISINOPRIL 5 MG PO TABS
5.0000 mg | ORAL_TABLET | Freq: Every day | ORAL | 1 refills | Status: DC
Start: 2019-12-29 — End: 2020-06-20

## 2019-12-29 MED ORDER — METFORMIN HCL ER 500 MG PO TB24: 2000.0000 mg | ORAL_TABLET | Freq: Every evening | ORAL | 3 refills | Status: DC

## 2019-12-29 NOTE — Patient Instructions (Addendum)
Please take lisinopril when you get home and call if blood pressure not less than 120/80 as we may need to increase dose.   Please confirm that you are on 20mg  crestor when you get home Increase metformin to 2000mg  total taken at night; you may do this slowly by increasing 500mg  per week until you reach 2000mg  total per day Items due: Pap smear Mammogram Colonoscopy  Diabetic exam   Please call me when I can place referral for colonoscopy and order for mammogram   Nice to see you!

## 2019-12-29 NOTE — Assessment & Plan Note (Signed)
Uncontrolled. Maximize metformin to aid with weight loss. Increase metformin to 2000mg  qhs. Close follow up.

## 2019-12-29 NOTE — Assessment & Plan Note (Addendum)
Elevated today however patient ran out of lisinpril 5mg . She is compliant with amlodipine 5mg ,continue.

## 2019-12-29 NOTE — Assessment & Plan Note (Signed)
Presume Improved on crestor 20mg  ; continue

## 2019-12-29 NOTE — Assessment & Plan Note (Signed)
Chronic, unchanged. Unfortunately she had side effects on cymbalta. She will continue gabapentin 300mg  qhs and flexeril 5mg  prn. Advised to let me know if she decides to pursue surgery.

## 2019-12-30 ENCOUNTER — Telehealth: Payer: Self-pay | Admitting: Family

## 2019-12-30 NOTE — Telephone Encounter (Signed)
close

## 2019-12-31 LAB — MULTIPLE MYELOMA PANEL, SERUM
Albumin SerPl Elph-Mcnc: 4.1 g/dL (ref 2.9–4.4)
Albumin/Glob SerPl: 1.1 (ref 0.7–1.7)
Alpha 1: 0.2 g/dL (ref 0.0–0.4)
Alpha2 Glob SerPl Elph-Mcnc: 0.8 g/dL (ref 0.4–1.0)
B-Globulin SerPl Elph-Mcnc: 1.3 g/dL (ref 0.7–1.3)
Gamma Glob SerPl Elph-Mcnc: 1.8 g/dL (ref 0.4–1.8)
Globulin, Total: 4.1 g/dL — ABNORMAL HIGH (ref 2.2–3.9)
IgA/Immunoglobulin A, Serum: 438 mg/dL — ABNORMAL HIGH (ref 87–352)
IgG (Immunoglobin G), Serum: 1763 mg/dL — ABNORMAL HIGH (ref 586–1602)
IgM (Immunoglobulin M), Srm: 103 mg/dL (ref 26–217)

## 2019-12-31 LAB — PE AND FLC, SERUM
Ig Kappa Free Light Chain: 40.8 mg/L — ABNORMAL HIGH (ref 3.3–19.4)
Ig Lambda Free Light Chain: 23.4 mg/L (ref 5.7–26.3)
KAPPA/LAMBDA RATIO: 1.74 — ABNORMAL HIGH (ref 0.26–1.65)
Total Protein: 8.2 g/dL (ref 6.0–8.5)

## 2020-01-01 LAB — IFE AND PE, RANDOM URINE
% BETA, Urine: 26.8 %
ALBUMIN, U: 39.4 %
ALPHA 1 URINE: 2.7 %
ALPHA-2-GLOBULIN, U: 15.4 %
GAMMA GLOBULIN URINE: 15.7 %
Protein, Ur: 11 mg/dL

## 2020-02-03 ENCOUNTER — Encounter: Payer: Self-pay | Admitting: Family

## 2020-02-04 ENCOUNTER — Other Ambulatory Visit: Payer: Self-pay

## 2020-02-04 DIAGNOSIS — E559 Vitamin D deficiency, unspecified: Secondary | ICD-10-CM

## 2020-02-04 MED ORDER — ROSUVASTATIN CALCIUM 20 MG PO TABS: 20.0000 mg | ORAL_TABLET | Freq: Every day | ORAL | 3 refills | Status: AC

## 2020-02-04 MED ORDER — CHOLECALCIFEROL 10 MCG (400 UNIT) PO CAPS: 800.0000 [IU] | ORAL_CAPSULE | Freq: Every day | ORAL | 1 refills | Status: AC

## 2020-02-04 MED ORDER — AMLODIPINE BESYLATE 5 MG PO TABS
5.0000 mg | ORAL_TABLET | Freq: Every day | ORAL | 1 refills | Status: DC
Start: 2020-02-04 — End: 2020-08-22

## 2020-03-30 ENCOUNTER — Encounter: Payer: Self-pay | Admitting: Family

## 2020-04-12 ENCOUNTER — Other Ambulatory Visit: Payer: Self-pay

## 2020-04-12 DIAGNOSIS — E118 Type 2 diabetes mellitus with unspecified complications: Secondary | ICD-10-CM

## 2020-04-12 MED ORDER — METFORMIN HCL ER 500 MG PO TB24: 2000.0000 mg | ORAL_TABLET | Freq: Every evening | ORAL | 3 refills | Status: AC

## 2020-05-29 ENCOUNTER — Encounter (HOSPITAL_COMMUNITY): Payer: Self-pay

## 2020-05-29 ENCOUNTER — Other Ambulatory Visit: Payer: Self-pay

## 2020-05-29 ENCOUNTER — Emergency Department (HOSPITAL_COMMUNITY)
Admission: EM | Admit: 2020-05-29 | Discharge: 2020-05-29 | Disposition: A | Discharge status: Home / Self Care | Payer: Medicaid Other | Attending: Emergency Medicine | Admitting: Emergency Medicine

## 2020-05-29 ENCOUNTER — Emergency Department (HOSPITAL_COMMUNITY): Payer: Medicaid Other

## 2020-05-29 DIAGNOSIS — M79604 Pain in right leg: Secondary | ICD-10-CM | POA: Insufficient documentation

## 2020-05-29 DIAGNOSIS — I129 Hypertensive chronic kidney disease with stage 1 through stage 4 chronic kidney disease, or unspecified chronic kidney disease: Secondary | ICD-10-CM | POA: Diagnosis not present

## 2020-05-29 DIAGNOSIS — N189 Chronic kidney disease, unspecified: Secondary | ICD-10-CM | POA: Diagnosis not present

## 2020-05-29 DIAGNOSIS — J45909 Unspecified asthma, uncomplicated: Secondary | ICD-10-CM | POA: Diagnosis not present

## 2020-05-29 DIAGNOSIS — Z7984 Long term (current) use of oral hypoglycemic drugs: Secondary | ICD-10-CM | POA: Insufficient documentation

## 2020-05-29 DIAGNOSIS — Z79899 Other long term (current) drug therapy: Secondary | ICD-10-CM | POA: Insufficient documentation

## 2020-05-29 DIAGNOSIS — M25561 Pain in right knee: Secondary | ICD-10-CM | POA: Diagnosis not present

## 2020-05-29 DIAGNOSIS — E1122 Type 2 diabetes mellitus with diabetic chronic kidney disease: Secondary | ICD-10-CM | POA: Diagnosis not present

## 2020-05-29 MED ORDER — LIDOCAINE 4 % EX PTCH: 1.0000 | MEDICATED_PATCH | Freq: Two times a day (BID) | CUTANEOUS | 0 refills | Status: DC

## 2020-05-29 MED ORDER — NAPROXEN 375 MG PO TABS: 375.0000 mg | ORAL_TABLET | Freq: Two times a day (BID) | ORAL | 0 refills | Status: AC

## 2020-05-29 MED ORDER — ACETAMINOPHEN 325 MG PO TABS
650.0000 mg | ORAL_TABLET | Freq: Once | ORAL | Status: AC
  Administered 2020-05-29: 650 mg via ORAL
  Filled 2020-05-29: qty 2

## 2020-05-29 MED ORDER — NAPROXEN 250 MG PO TABS
500.0000 mg | ORAL_TABLET | Freq: Once | ORAL | Status: AC
  Administered 2020-05-29: 500 mg via ORAL
  Filled 2020-05-29: qty 2

## 2020-05-29 MED ORDER — ACETAMINOPHEN ER 650 MG PO TBCR: 650.0000 mg | EXTENDED_RELEASE_TABLET | Freq: Three times a day (TID) | ORAL | 0 refills | Status: AC | PRN
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
## 2020-05-29 NOTE — ED Provider Notes (Addendum)
[facility] EMERGENCY DEPARTMENT Provider Note   CSN: [id number] Arrival date & time: 05/29/20  4098     History  Sandra Gray is a 50 y.o. female.  HPI    50 year old female comes in a chief complaint of leg pain.  Patient has history of diabetes, chronic back disease with radiculopathy.  She reports that over the last month she has had increased right leg pain.  The pain is primarily located over the right thigh region and the top of her knee.  Her pain is worse with any kind of activity and she is unable to bend the knee properly.  Patient denies any trauma.  She denies any numbness, tingling, nausea, vomiting, fevers, chills.  Patient does not have any immunosuppression.  Past Medical History:  Diagnosis Date  . Asthma 2008  . Bronchitis   . Chronic kidney disease    "declining kidney, but it's stable right now"  . Fibroids   . Hypertension 2013   Started on lisinopril  . Pneumonia     Patient Active Problem List   Diagnosis Date Noted  . Right thigh pain 03/31/2018  . Leukocytosis 02/07/2018  . Chronic pain of both knees 01/31/2018  . Pain in both lower extremities 01/13/2018  . Tachycardia 01/13/2018  . Chronic right shoulder pain 10/18/2017  . Right hand pain 10/18/2017  . Hyperlipidemia 10/11/2017  . Type 2 diabetes mellitus with complication, without long-term current use of insulin ([place]) 10/11/2017  . Rash 10/25/2016  . Hormone replacement therapy (HRT) 09/24/2016  . Screening for breast cancer 04/25/2016  . Chronic low back pain 04/25/2016  . Pain of right thumb 12/16/2015  . Cough 12/16/2015  . Endometriosis of rectovaginal septum 06/14/2015  . Endometriosis 02/16/2015  . Endometriosis of pelvis 10/12/2014  . Uterine fibroid 12/28/2013  . Obesity (BMI 30-39.9) 11/30/2013  . Hypertension 03/26/2012  . Anxiety and depression 03/26/2012  . Routine general medical examination at a health care facility 03/26/2012    Past Surgical History:   Procedure Laterality Date  . DILATION AND CURETTAGE OF UTERUS  08/2011  . HYSTEROSCOPY  02/15/2012   Fibroid Removal  . HYSTEROSCOPY  may 2014  . ROBOTIC ASSISTED SALPINGO OOPHERECTOMY Bilateral 07/28/2015   Procedure: XI ROBOTIC ASSISTED BILATERAL SALPINGO OOPHORECTOMY AND LYSIS OF ADHESIONS;  Surgeon: Everitt Amber, MD;  Location: [facility];  Service: Gynecology;  Laterality: Bilateral;  . SUPRACERVICAL ABDOMINAL HYSTERECTOMY N/A 08/31/2014   Procedure: HYSTERECTOMY SUPRACERVICAL ABDOMINAL;  Surgeon: [name], MD;  Location: [facility];  Service: Gynecology;  Laterality: N/A;  Dr. Elonda Husky will assist     OB History    Gravida  2   Para  1   Term  1   Preterm      AB  1   Living  1     SAB  1   IAB      Ectopic      Multiple      Live Births  1           Family History  Problem Relation Age of Onset  . Hypertension Mother   . Hypertension Father   . Diabetes Father   . Kidney disease Maternal Grandmother   . Cancer Maternal Grandfather        Lung - Asbestos exposure  . Diabetes Paternal Grandmother   . Heart disease Paternal Grandfather   . Breast cancer Neg Hx     Social History   Tobacco Use  . Smoking status:  Never Smoker  . Smokeless tobacco: Never Used  Substance Use Topics  . Alcohol use: Yes    Alcohol/week: 1.0 standard drink    Types: 1 Standard drinks or equivalent per week    Comment: Rarely  . Drug use: No    Home Medications Prior to Admission medications   Medication Sig Start Date End Date Taking? Authorizing Provider  acetaminophen (TYLENOL 8 HOUR) 650 MG CR tablet Take 1 tablet (650 mg total) by mouth every 8 (eight) hours as needed for pain or fever. 05/29/20  Yes Varney Biles, MD  albuterol (PROVENTIL HFA;VENTOLIN HFA) 108 (90 Base) MCG/ACT inhaler Inhale 2 puffs into the lungs every 6 (six) hours as needed for wheezing. 11/29/17  Yes Burnard Hawthorne, FNP  albuterol (PROVENTIL) (2.5 MG/3ML) 0.083% nebulizer solution Take 3 mLs  (2.5 mg total) by nebulization every 6 (six) hours as needed for wheezing or shortness of breath. 12/16/15  Yes Leone Haven, MD  amLODipine (NORVASC) 5 MG tablet Take 1 tablet (5 mg total) by mouth daily. 02/04/20  Yes Burnard Hawthorne, FNP  Cholecalciferol 10 MCG (400 UNIT) CAPS Take 2 capsules (800 Units total) by mouth daily. Patient taking differently: Take 400 Units by mouth daily. 02/04/20  Yes Arnett, Yvetta Coder, FNP  cyclobenzaprine (FLEXERIL) 5 MG tablet Take 1 tablet (5 mg total) by mouth at bedtime as needed for muscle spasms. 07/24/17  Yes Arnett, Yvetta Coder, FNP  gabapentin (NEURONTIN) 100 MG capsule TAKE 3 CAPSULES BY MOUTH AT BEDTIME Patient taking differently: Take 300 mg by mouth at bedtime. 05/01/18  Yes Arnett, Yvetta Coder, FNP  Lidocaine 4 % PTCH Apply 1 patch topically 2 (two) times daily. 05/29/20  Yes Monseratt Ledin, MD  lisinopril (ZESTRIL) 5 MG tablet Take 1 tablet (5 mg total) by mouth daily. 12/29/19  Yes Burnard Hawthorne, FNP  metFORMIN (GLUCOPHAGE XR) 500 MG 24 hr tablet Take 4 tablets (2,000 mg total) by mouth every evening. Patient taking differently: Take 1,000 mg by mouth every evening. 04/12/20  Yes Burnard Hawthorne, FNP  naproxen (NAPROSYN) 375 MG tablet Take 1 tablet (375 mg total) by mouth 2 (two) times daily. 05/29/20  Yes Varney Biles, MD  rosuvastatin (CRESTOR) 20 MG tablet Take 1 tablet (20 mg total) by mouth daily. 02/04/20  Yes Arnett, Yvetta Coder, FNP  fluticasone (FLOVENT HFA) 110 MCG/ACT inhaler Inhale 1 puff into the lungs 2 (two) times daily. Patient not taking: Reported on 05/29/2020 12/22/15   Leone Haven, MD  triamcinolone (KENALOG) 0.025 % cream Apply 1 application topically 2 (two) times daily. Patient not taking: Reported on 05/29/2020 10/26/16   Burnard Hawthorne, FNP    Allergies    Cymbalta [duloxetine hcl] and Gadolinium derivatives  Review of Systems   Review of Systems  Constitutional: Positive for activity change.   Musculoskeletal: Positive for arthralgias and myalgias.  Allergic/Immunologic: Negative for immunocompromised state.  Hematological: Does not bruise/bleed easily.  All other systems reviewed and are negative.   Physical Exam Updated Vital Signs BP (!) 116/96 (BP Location: Right Arm)   Pulse 90   Temp 99 F (37.2 C) (Oral)   Resp 18   Ht 5\' 3"  (1.6 m)   Wt 90.7 kg   LMP 08/02/2014   SpO2 97%   BMI 35.43 kg/m   Physical Exam Vitals and nursing note reviewed.  Constitutional:      Appearance: She is well-developed.  HENT:     Head: Normocephalic and atraumatic.  Cardiovascular:  Rate and Rhythm: Normal rate.  Pulmonary:     Effort: Pulmonary effort is normal.  Abdominal:     General: Bowel sounds are normal.  Musculoskeletal:     Cervical back: Normal range of motion and neck supple.     Comments: Patient has suprapatellar edema, minimal and there is no surrounding erythema or warmth to touch.  Patient has tenderness to palpation of the suprapatellar region.  Patient unable to bend her knee actively secondary to pain  Skin:    General: Skin is warm and dry.  Neurological:     Mental Status: She is alert and oriented to person, place, and time.     ED Results / Procedures / Treatments   Labs (all labs ordered are listed, but only abnormal results are displayed) Labs Reviewed - No data to display  EKG None  Radiology DG Knee Complete 4 Views Right  Result Date: 05/29/2020 CLINICAL DATA:  Right leg pain for 1 month.  No injury. EXAM: RIGHT KNEE - COMPLETE 4+ VIEW COMPARISON:  None. FINDINGS: No evidence of fracture, dislocation, or joint effusion. Minimal degenerative joint changes are identified in the patella. Soft tissues are unremarkable. IMPRESSION: No acute abnormality noted. Electronically Signed   By: Abelardo Diesel M.D.   On: 05/29/2020 11:53    Procedures Procedures   Medications Ordered in ED Medications  naproxen (NAPROSYN) tablet 500 mg (500 mg  Oral Given 05/29/20 1158)  acetaminophen (TYLENOL) tablet 650 mg (650 mg Oral Given 05/29/20 1159)    ED Course  I have reviewed the triage vital signs and the nursing notes.  Pertinent labs & imaging results that were available during my care of the patient were reviewed by me and considered in my medical decision making (see chart for details).  Clinical Course as of 05/29/20 1439  Sun May 29, 2020  1439 DG Knee Complete 4 Views Right The patient appears reasonably screened and/or stabilized for discharge and I doubt any other medical condition or other [facility] requiring further screening, evaluation, or treatment in the ED at this time prior to discharge.  Results from the ER workup discussed with the patient face to face and all questions answered to the best of my ability. The patient is safe for discharge with strict return precautions.   [AN]    Clinical Course User Index [AN] Varney Biles, MD   MDM Rules/Calculators/A&P                          50 year old female comes in with chief complaint of knee pain.  Patient has unilateral knee pain that has been present now for more than a month.  Initially the pain was intermittent, now pain is constant and disabling her.  She is unable to flex and extend the knee either.  On exam there is mild edema appreciated over the right knee without any overt evidence of infection.  There is no warmth to touch or redness and patient is immunocompetent.  Symptoms have been present for a month, making infectious etiology or gout for that matter less likely.  Initial clinical impression is that patient likely has degenerative knee disease -arthritis versus meniscus tear.   Plan is to get the x-rays and reassess.  Final Clinical Impression(s) / ED Diagnoses Final diagnoses:  Acute pain of right knee    Rx / DC Orders ED Discharge Orders         Ordered    Lidocaine 4 %  PTCH  2 times daily        05/29/20 1401    naproxen (NAPROSYN) 375 MG  tablet  2 times daily        05/29/20 1401    acetaminophen (TYLENOL 8 HOUR) 650 MG CR tablet  Every 8 hours PRN        05/29/20 1401           Varney Biles, MD 05/29/20 1222    Varney Biles, MD 05/29/20 1439

## 2020-05-29 NOTE — Discharge Instructions (Addendum)
Take the medications prescribed for symptom control. Use the knee immobilizer for extra support.  Read the instructions provided on further management.  Recommend that you follow-up with the orthopedist in 1 to 2 weeks for further care.

## 2020-05-30 ENCOUNTER — Telehealth: Payer: Self-pay | Admitting: Orthopedic Surgery

## 2020-05-30 NOTE — Telephone Encounter (Signed)
Called patient per voice message regarding emergency room visit yesterday at [facility] for knee pain; offered next available appointment. States she is also treating with neurosurgeon, and is also in touch with their office; said would like to wait and call back to our office if needs to schedule appointment.

## 2020-06-06 ENCOUNTER — Telehealth: Payer: Self-pay

## 2020-06-06 NOTE — Telephone Encounter (Signed)
Noted and Agree

## 2020-06-06 NOTE — Telephone Encounter (Addendum)
Called and spoke to Sandra Gray. Sandra Gray states that she has been in the ER for knee pain over Easter Weekend and has extremely limited mobility. She states that she is currently on crutches and was told by the hospital that she needs to see her PCP for a referral for knee pain. I informed her that Sandra Gray does not do knee injections and that she should go to [facility] if she wants a steroid joint injection. I explained that Sandra Gray is happy to see her otherwise but wanted her to be aware. Sandra Gray verbalized understanding and states that she wants to keep her appointment tomorrow in order to get a referral for her knee pain and she will [facility] in order to receive the knee injection.

## 2020-06-07 ENCOUNTER — Other Ambulatory Visit: Payer: Self-pay

## 2020-06-07 ENCOUNTER — Ambulatory Visit (INDEPENDENT_AMBULATORY_CARE_PROVIDER_SITE_OTHER): Payer: Medicaid Other | Admitting: Adult Health

## 2020-06-07 ENCOUNTER — Encounter: Payer: Self-pay | Admitting: Adult Health

## 2020-06-07 VITALS — BP 130/96 | HR 98 | Ht 62.99 in | Wt 200.0 lb

## 2020-06-07 DIAGNOSIS — M13861 Other specified arthritis, right knee: Secondary | ICD-10-CM | POA: Diagnosis not present

## 2020-06-07 DIAGNOSIS — M25561 Pain in right knee: Secondary | ICD-10-CM | POA: Diagnosis not present

## 2020-06-07 DIAGNOSIS — G8929 Other chronic pain: Secondary | ICD-10-CM | POA: Diagnosis not present

## 2020-06-07 DIAGNOSIS — M545 Low back pain, unspecified: Secondary | ICD-10-CM | POA: Diagnosis not present

## 2020-06-07 DIAGNOSIS — J4520 Mild intermittent asthma, uncomplicated: Secondary | ICD-10-CM | POA: Diagnosis not present

## 2020-06-07 LAB — COMPREHENSIVE METABOLIC PANEL
ALT: 13 U/L (ref 0–35)
AST: 13 U/L (ref 0–37)
Albumin: 4.2 g/dL (ref 3.5–5.2)
Alkaline Phosphatase: 71 U/L (ref 39–117)
BUN: 16 mg/dL (ref 6–23)
CO2: 25 mEq/L (ref 19–32)
Calcium: 10.1 mg/dL (ref 8.4–10.5)
Chloride: 105 mEq/L (ref 96–112)
Creatinine, Ser: 1.05 mg/dL (ref 0.40–1.20)
GFR: 63.3 mL/min (ref >60.00)
Glucose, Bld: 103 mg/dL — ABNORMAL HIGH (ref 70–99)
Potassium: 4.4 mEq/L (ref 3.5–5.1)
Sodium: 139 mEq/L (ref 135–145)
Total Bilirubin: 0.3 mg/dL (ref 0.2–1.2)
Total Protein: 8 g/dL (ref 6.0–8.3)

## 2020-06-07 LAB — CBC WITH DIFFERENTIAL/PLATELET
Basophils Absolute: 0 10*3/uL (ref 0.0–0.1)
Basophils Relative: 0.5 % (ref 0.0–3.0)
Eosinophils Absolute: 0.3 10*3/uL (ref 0.0–0.7)
Eosinophils Relative: 3.4 % (ref 0.0–5.0)
HCT: 37.3 % (ref 36.0–46.0)
Hemoglobin: 11.7 g/dL — ABNORMAL LOW (ref 12.0–15.0)
Lymphocytes Relative: 37.1 % (ref 12.0–46.0)
Lymphs Abs: 3.5 10*3/uL (ref 0.7–4.0)
MCHC: 31.5 g/dL (ref 30.0–36.0)
MCV: 77.3 fl — ABNORMAL LOW (ref 78.0–100.0)
Monocytes Absolute: 0.7 10*3/uL (ref 0.1–1.0)
Monocytes Relative: 7.8 % (ref 3.0–12.0)
Neutro Abs: 4.8 10*3/uL (ref 1.4–7.7)
Neutrophils Relative %: 51.2 % (ref 43.0–77.0)
Platelets: 260 10*3/uL (ref 150.0–400.0)
RBC: 4.83 Mil/uL (ref 3.87–5.11)
RDW: 15.5 % (ref 11.5–15.5)
WBC: 9.5 10*3/uL (ref 4.0–10.5)

## 2020-06-07 MED ORDER — CYCLOBENZAPRINE HCL 5 MG PO TABS: 5.0000 mg | ORAL_TABLET | Freq: Every evening | ORAL | 0 refills | Status: AC | PRN

## 2020-06-07 MED ORDER — LIDOCAINE 5 % EX PTCH: 1.0000 | MEDICATED_PATCH | CUTANEOUS | 0 refills | Status: DC

## 2020-06-07 MED ORDER — ALBUTEROL SULFATE HFA 108 (90 BASE) MCG/ACT IN AERS: 2.0000 | INHALATION_SPRAY | Freq: Four times a day (QID) | RESPIRATORY_TRACT | 2 refills | Status: AC | PRN

## 2020-06-07 NOTE — Progress Notes (Signed)
Acute Office Visit  Subjective:    Patient ID: Sandra Gray, female    DOB: [birth year], 50 y.o.   MRN: [id number]  Chief Complaint  Patient presents with  . Knee Pain    Pt c/o right knee pain. Pt states knee has been getting worse since previous spinal injury. Prescription for a wheelchair or a mobile chair to assist with mobility.  . Medication Refill    Flovent inhaler, flexeril, Lidocaine 5%    HPI Patient is in today for right knee pain, she was seen in the emergency room on 05/19/20 and she has had pain going on for one month in right leg/ knee. Pain continues to increase.  She has been using lidocaine 5 % patch at home as directed and requests refill.  She has not been able to bare weight for the last week.   Denies any warmth, erythema. She did have a fall last week but does not endorse any new injury or worsening pain.   She is seeing Dr. Trenton Gammon at [facility] neurosurgeon and spine and not scheduled lower back surgery yet but reports she will be having.   . Denies any loss of bowel or bladder control.  Denies saddle paresthesias.  Denies radiculopathy/ paresthesias.    Patient  denies any fever, body aches,chills, rash, chest pain, shortness of breath, nausea, vomiting, or diarrhea.   Denies dizziness, lightheadedness, pre syncopal or syncopal episodes.   Right knee x ray below was reviewed:  FINDINGS: No evidence of fracture, dislocation, or joint effusion. Minimal degenerative joint changes are identified in the patella. Soft tissues are unremarkable.  IMPRESSION: No acute abnormality noted.   Electronically Signed   By: Abelardo Diesel M.D.   On: 05/29/2020 11:53   Past Medical History:  Diagnosis Date  . Asthma 2008  . Bronchitis   . Chronic kidney disease    "declining kidney, but it's stable right now"  . Fibroids   . Hypertension 2013   Started on lisinopril  . Pneumonia     Past Surgical History:  Procedure Laterality Date  . DILATION  AND CURETTAGE OF UTERUS  08/2011  . HYSTEROSCOPY  02/15/2012   Fibroid Removal  . HYSTEROSCOPY  may 2014  . ROBOTIC ASSISTED SALPINGO OOPHERECTOMY Bilateral 07/28/2015   Procedure: XI ROBOTIC ASSISTED BILATERAL SALPINGO OOPHORECTOMY AND LYSIS OF ADHESIONS;  Surgeon: Everitt Amber, MD;  Location: [facility];  Service: Gynecology;  Laterality: Bilateral;  . SUPRACERVICAL ABDOMINAL HYSTERECTOMY N/A 08/31/2014   Procedure: HYSTERECTOMY SUPRACERVICAL ABDOMINAL;  Surgeon: [name], MD;  Location: [facility];  Service: Gynecology;  Laterality: N/A;  Dr. Elonda Husky will assist    Family History  Problem Relation Age of Onset  . Hypertension Mother   . Hypertension Father   . Diabetes Father   . Kidney disease Maternal Grandmother   . Cancer Maternal Grandfather        Lung - Asbestos exposure  . Diabetes Paternal Grandmother   . Heart disease Paternal Grandfather   . Breast cancer Neg Hx     Social History   Socioeconomic History  . Marital status: Legally Separated    Spouse name: Not on file  . Number of children: Not on file  . Years of education: Not on file  . Highest education level: Not on file  Occupational History  . Not on file  Tobacco Use  . Smoking status: Never Smoker  . Smokeless tobacco: Never Used  Substance and Sexual Activity  . Alcohol use: Yes  Alcohol/week: 1.0 standard drink    Types: 1 Standard drinks or equivalent per week    Comment: Rarely  . Drug use: No  . Sexual activity: Not Currently    Birth control/protection: Condom  Other Topics Concern  . Not on file  Social History Narrative   Works at [employer] Strain: Not on [employer] Insecurity: Not on file  Transportation Needs: Not on file  Physical Activity: Not on file  Stress: Not on file  Social Connections: Not on file  Intimate Partner Violence: Not on file    Outpatient Medications Prior to Visit  Medication Sig Dispense Refill  .  acetaminophen (TYLENOL 8 HOUR) 650 MG CR tablet Take 1 tablet (650 mg total) by mouth every 8 (eight) hours as needed for pain or fever. 30 tablet 0  . albuterol (PROVENTIL) (2.5 MG/3ML) 0.083% nebulizer solution Take 3 mLs (2.5 mg total) by nebulization every 6 (six) hours as needed for wheezing or shortness of breath. 150 mL 1  . amLODipine (NORVASC) 5 MG tablet Take 1 tablet (5 mg total) by mouth daily. 90 tablet 1  . Cholecalciferol 10 MCG (400 UNIT) CAPS Take 2 capsules (800 Units total) by mouth daily. (Patient taking differently: Take 400 Units by mouth daily.) 120 capsule 1  . fluticasone (FLOVENT HFA) 110 MCG/ACT inhaler Inhale 1 puff into the lungs 2 (two) times daily. 1 Inhaler 0  . gabapentin (NEURONTIN) 100 MG capsule TAKE 3 CAPSULES BY MOUTH AT BEDTIME (Patient taking differently: Take 300 mg by mouth at bedtime.) 270 capsule 0  . lisinopril (ZESTRIL) 5 MG tablet Take 1 tablet (5 mg total) by mouth daily. 90 tablet 1  . metFORMIN (GLUCOPHAGE XR) 500 MG 24 hr tablet Take 4 tablets (2,000 mg total) by mouth every evening. (Patient taking differently: Take 1,000 mg by mouth every evening.) 120 tablet 3  . naproxen (NAPROSYN) 375 MG tablet Take 1 tablet (375 mg total) by mouth 2 (two) times daily. 10 tablet 0  . rosuvastatin (CRESTOR) 20 MG tablet Take 1 tablet (20 mg total) by mouth daily. 90 tablet 3  . triamcinolone (KENALOG) 0.025 % cream Apply 1 application topically 2 (two) times daily. 80 g 1  . albuterol (PROVENTIL HFA;VENTOLIN HFA) 108 (90 Base) MCG/ACT inhaler Inhale 2 puffs into the lungs every 6 (six) hours as needed for wheezing. 1 Inhaler 2  . cyclobenzaprine (FLEXERIL) 5 MG tablet Take 1 tablet (5 mg total) by mouth at bedtime as needed for muscle spasms. 90 tablet 0  . Lidocaine 4 % PTCH Apply 1 patch topically 2 (two) times daily. 12 patch 0   No facility-administered medications prior to visit.    Allergies  Allergen Reactions  . Cymbalta [Duloxetine Hcl] Other (See  Comments)    Nausea & headache   . Gadolinium Derivatives Nausea Only and Other (See Comments)    Dry heaves    Review of Systems  HENT: Negative.   Respiratory: Negative.        None currently but does ask for chronic medication refill for albuterol. Denies over use.   Cardiovascular: Negative.   Gastrointestinal: Negative.   Genitourinary: Negative.   Musculoskeletal: Positive for arthralgias and back pain (chronic followed by Dr. Mickey Farber neurosurgeon. ). Negative for gait problem, joint swelling, myalgias and neck pain.  Neurological: Negative.   Hematological: Negative.        Objective:    Physical  Exam Vitals reviewed.  Constitutional:      Appearance: She is obese. She is not toxic-appearing or diaphoretic.     Comments: In wheelchair, tearful due to pain in knee she reports.   HENT:     Head: Normocephalic and atraumatic.     Right Ear: External ear normal.     Left Ear: External ear normal.     Nose: Nose normal.     Mouth/Throat:     Mouth: Mucous membranes are moist.  Eyes:     General: No scleral icterus.    Conjunctiva/sclera: Conjunctivae normal.  Cardiovascular:     Rate and Rhythm: Normal rate and regular rhythm.  Abdominal:     Palpations: Abdomen is soft.     Tenderness: There is no right CVA tenderness or left CVA tenderness.  Musculoskeletal:     Right upper leg: Normal.     Left upper leg: Normal.     Right knee: Decreased range of motion. Tenderness present over the ACL and patellar tendon.     Left knee: Normal.     Right lower leg: Edema (trace bilateral ) present.     Left lower leg: Edema present.       Legs:     Comments: Pain anterior knee with palpation, unable to stand per patient, no warmth.   Negative homans sign.  No obvious swelling of leg or knee noted.  No bakers cyst palpated in popliteal area.   Skin:    General: Skin is warm.     Coloration: Skin is not pale.     Findings: No bruising, erythema, lesion or rash.   Neurological:     Mental Status: She is oriented to person, place, and time.     Motor: No weakness.     Gait: Gait abnormal (unable to stand on affected knee in wheelchair. ).  Psychiatric:        Mood and Affect: Mood normal.        Behavior: Behavior normal.        Thought Content: Thought content normal.        Judgment: Judgment normal.     BP (!) 130/96 (BP Location: Left Arm, Patient Position: Sitting)   Pulse 98   Ht 5' 2.99" (1.6 m)   Wt 200 lb (90.7 kg)   LMP 08/02/2014   SpO2 98%   BMI 35.44 kg/m  Wt Readings from Last 3 Encounters:  06/07/20 200 lb (90.7 kg)  05/29/20 200 lb (90.7 kg)  12/29/19 236 lb 9.6 oz (107.3 kg)    Health Maintenance Due  Topic Date Due  . Hepatitis C Screening  Never done  . COVID-19 Vaccine (1) Never done  . OPHTHALMOLOGY EXAM  Never done  . PAP SMEAR-Modifier  08/25/2017  . COLONOSCOPY (Pts 45-46yr Insurance coverage will need to be confirmed)  Never done  . FOOT EXAM  01/09/2019  . HEMOGLOBIN A1C  05/23/2020    There are no preventive care reminders to display for this patient.   Lab Results  Component Value Date   TSH 3.22 06/26/2019   Lab Results  Component Value Date   WBC 10.2 06/26/2019   HGB 11.9 (L) 06/26/2019   HCT 37.8 06/26/2019   MCV 79.8 06/26/2019   PLT 316.0 06/26/2019   Lab Results  Component Value Date   NA 137 12/29/2019   K 4.5 12/29/2019   CHLORIDE 110 (H) 06/20/2015   CO2 28 12/29/2019   GLUCOSE 118 (H) 12/29/2019  BUN 13 12/29/2019   CREATININE 1.12 12/29/2019   BILITOT 0.4 12/29/2019   ALKPHOS 90 12/29/2019   AST 13 12/29/2019   ALT 14 12/29/2019   PROT 8.3 12/29/2019   PROT 8.2 12/29/2019   ALBUMIN 4.5 12/29/2019   CALCIUM 10.0 12/29/2019   ANIONGAP 6 07/22/2015   EGFR 62 (L) 06/20/2015   GFR 58.76 (L) 12/29/2019   Lab Results  Component Value Date   CHOL 158 11/23/2019   Lab Results  Component Value Date   HDL 58 11/23/2019   Lab Results  Component Value Date    LDLCALC 80 11/23/2019   Lab Results  Component Value Date   TRIG 111 11/23/2019   Lab Results  Component Value Date   CHOLHDL 2.7 11/23/2019   Lab Results  Component Value Date   HGBA1C 7.1 (H) 11/23/2019       Assessment & Plan:   Problem List Items Addressed This Visit      Respiratory   Mild intermittent asthma without complication   Relevant Medications   albuterol (VENTOLIN HFA) 108 (90 Base) MCG/ACT inhaler     Other   Chronic low back pain   Relevant Medications   cyclobenzaprine (FLEXERIL) 5 MG tablet    Other Visit Diagnoses    Acute pain of right knee    -  Primary   Relevant Orders   Ambulatory referral to Orthopedic Surgery   CBC with Differential/Platelet   Comprehensive Metabolic Panel (CMET)     Orders Placed This Encounter  Procedures  . CBC with Differential/Platelet  . Comprehensive Metabolic Panel (CMET)  . Ambulatory referral to Orthopedic Surgery    Referral Priority:   Urgent    Referral Type:   Surgical    Referral Reason:   Specialty Services Required    Requested Specialty:   Orthopedic Surgery    Number of Visits Requested:   1  [facility] at 1[facility] to be evaluated she is unable to stand in the office. Difficult exam.  Given her pain in office and normal right knee ray will need further imaging likely MRI at discretion of orthopedics to rule out ligament tear given inability to bare weight. She has crutches, is in wheelchair today.  Meds ordered this encounter  Medications  . lidocaine (LIDODERM) 5 %    Sig: Place 1 patch onto the skin daily. Remove & Discard patch within 12 hours or as directed by MD    Dispense:  30 patch    Refill:  0    NDC 7846-9629-52  . cyclobenzaprine (FLEXERIL) 5 MG tablet    Sig: Take 1 tablet (5 mg total) by mouth at bedtime as needed for muscle spasms.    Dispense:  90 tablet    Refill:  0  . albuterol (VENTOLIN HFA) 108 (90 Base) MCG/ACT inhaler    Sig:  Inhale 2 puffs into the lungs every 6 (six) hours as needed for wheezing.    Dispense:  1 each    Refill:  2   Chronic med refill with albuterol given red flags and overuse discussed.  Return in about 2 weeks (around 06/21/2020), or if symptoms worsen or fail to improve, for at any time for any worsening symptoms, Go to Emergency room/ urgent care if worse.Red Flags discussed. The patient was given clear instructions to go to ER or return to medical center if any red flags develop, symptoms do not improve, worsen or new  problems develop. They verbalized understanding.  Marcille Buffy, FNP

## 2020-06-07 NOTE — Patient Instructions (Signed)
Acute Knee Pain, Adult Many things can cause knee pain. Sometimes, knee pain is sudden (acute) and may be caused by damage, swelling, or irritation of the muscles and tissues that support your knee. The pain often goes away on its own with time and rest. If the pain does not go away, tests may be done to find out what is causing the pain. Follow these instructions at home: If you have a knee sleeve or brace:  Wear the knee sleeve or brace as told by your doctor. Take it off only as told by your doctor.  Loosen it if your toes: ? Tingle. ? Become numb. ? Turn cold and blue.  Keep it clean.  If the knee sleeve or brace is not waterproof: ? Do not let it get wet. ? Cover it with a watertight covering when you take a bath or shower.   Activity  Rest your knee.  Do not do things that cause pain or make pain worse.  Avoid activities where both feet leave the ground at the same time (high-impact activities). Examples are running, jumping rope, and doing jumping jacks.  Work with a physical therapist to make a safe exercise program, as told by your doctor. Managing pain, stiffness, and swelling  If told, put ice on the knee. To do this: ? If you have a removable knee sleeve or brace, take it off as told by your doctor. ? Put ice in a plastic bag. ? Place a towel between your skin and the bag. ? Leave the ice on for 20 minutes, 2-3 times a day. ? Take off the ice if your skin turns bright red. This is very important. If you cannot feel pain, heat, or cold, you have a greater risk of damage to the area.  If told, use an elastic bandage to put pressure (compression) on your injured knee.  Raise your knee above the level of your heart while you are sitting or lying down.  Sleep with a pillow under your knee.   General instructions  Take over-the-counter and prescription medicines only as told by your doctor.  Do not smoke or use any products that contain nicotine or tobacco. If you  need help quitting, ask your doctor.  If you are overweight, work with your doctor and a food expert (dietitian) to set goals to lose weight. Being overweight can make your knee hurt more.  Watch for any changes in your symptoms.  Keep all follow-up visits. Contact a doctor if:  The knee pain does not stop.  The knee pain changes or gets worse.  You have a fever along with knee pain.  Your knee is red or feels warm when you touch it.  Your knee gives out or locks up. Get help right away if:  Your knee swells, and the swelling gets worse.  You cannot move your knee.  You have very bad knee pain that does not get better with pain medicine. Summary  Many things can cause knee pain. The pain often goes away on its own with time and rest.  Your doctor may do tests to find out the cause of the pain.  Watch for any changes in your symptoms. Relieve your pain with rest, medicines, light activity, and use of ice.  Get help right away if you cannot move your knee or your knee pain is very bad. This information is not intended to replace advice given to you by your health care provider. Make sure you discuss   any questions you have with your health care provider. Document Revised: 07/15/2019 Document Reviewed: 07/15/2019 Elsevier Patient Education  2021 Elsevier Inc.  

## 2020-06-08 NOTE — Progress Notes (Signed)
CMP ok glucose mild elevation but fine as she was not fasting.  Hemoglobin appears chronically low, will recheck TIBC ferritin and iron if can be added on. Be sure patient is not having any abnormal bleeding.

## 2020-06-20 ENCOUNTER — Other Ambulatory Visit: Payer: Self-pay | Admitting: Family

## 2020-06-21 DIAGNOSIS — M170 Bilateral primary osteoarthritis of knee: Secondary | ICD-10-CM | POA: Diagnosis not present

## 2020-06-24 ENCOUNTER — Ambulatory Visit: Payer: Medicaid Other | Admitting: Family

## 2020-06-28 DIAGNOSIS — E119 Type 2 diabetes mellitus without complications: Secondary | ICD-10-CM | POA: Diagnosis not present

## 2020-06-28 DIAGNOSIS — F322 Major depressive disorder, single episode, severe without psychotic features: Secondary | ICD-10-CM | POA: Diagnosis not present

## 2020-06-28 DIAGNOSIS — M545 Low back pain, unspecified: Secondary | ICD-10-CM | POA: Diagnosis not present

## 2020-06-28 DIAGNOSIS — D509 Iron deficiency anemia, unspecified: Secondary | ICD-10-CM | POA: Diagnosis not present

## 2020-06-28 DIAGNOSIS — I10 Essential (primary) hypertension: Secondary | ICD-10-CM | POA: Diagnosis not present

## 2020-06-28 DIAGNOSIS — M170 Bilateral primary osteoarthritis of knee: Secondary | ICD-10-CM | POA: Diagnosis not present

## 2020-06-28 DIAGNOSIS — E785 Hyperlipidemia, unspecified: Secondary | ICD-10-CM | POA: Diagnosis not present

## 2020-06-28 DIAGNOSIS — M6281 Muscle weakness (generalized): Secondary | ICD-10-CM | POA: Diagnosis not present

## 2020-07-26 DIAGNOSIS — E785 Hyperlipidemia, unspecified: Secondary | ICD-10-CM | POA: Diagnosis not present

## 2020-07-26 DIAGNOSIS — D509 Iron deficiency anemia, unspecified: Secondary | ICD-10-CM | POA: Diagnosis not present

## 2020-07-26 DIAGNOSIS — Z1231 Encounter for screening mammogram for malignant neoplasm of breast: Secondary | ICD-10-CM | POA: Diagnosis not present

## 2020-07-26 DIAGNOSIS — Z0001 Encounter for general adult medical examination with abnormal findings: Secondary | ICD-10-CM | POA: Diagnosis not present

## 2020-07-26 DIAGNOSIS — E119 Type 2 diabetes mellitus without complications: Secondary | ICD-10-CM | POA: Diagnosis not present

## 2020-07-26 DIAGNOSIS — Z6841 Body Mass Index (BMI) 40.0 and over, adult: Secondary | ICD-10-CM | POA: Diagnosis not present

## 2020-07-26 DIAGNOSIS — M6281 Muscle weakness (generalized): Secondary | ICD-10-CM | POA: Diagnosis not present

## 2020-07-26 DIAGNOSIS — I10 Essential (primary) hypertension: Secondary | ICD-10-CM | POA: Diagnosis not present

## 2020-07-26 DIAGNOSIS — M170 Bilateral primary osteoarthritis of knee: Secondary | ICD-10-CM | POA: Diagnosis not present

## 2020-07-26 DIAGNOSIS — J4520 Mild intermittent asthma, uncomplicated: Secondary | ICD-10-CM | POA: Diagnosis not present

## 2020-07-26 DIAGNOSIS — M545 Low back pain, unspecified: Secondary | ICD-10-CM | POA: Diagnosis not present

## 2020-08-04 DIAGNOSIS — M5104 Intervertebral disc disorders with myelopathy, thoracic region: Secondary | ICD-10-CM | POA: Diagnosis not present

## 2020-08-10 ENCOUNTER — Other Ambulatory Visit: Payer: Self-pay | Admitting: Neurosurgery

## 2020-08-22 ENCOUNTER — Other Ambulatory Visit: Payer: Self-pay | Admitting: Family

## 2020-08-22 ENCOUNTER — Other Ambulatory Visit: Payer: Self-pay | Admitting: Adult Health

## 2020-09-08 NOTE — Pre-Procedure Instructions (Addendum)
Surgical Instructions    Your procedure is scheduled on Tuesday, August 2nd, 2022.  Report to [facility] Main Entrance "A" at 06:00 A.M., then check in with the Admitting office.  Call this number if you have problems the morning of surgery:  [phone]   If you have any questions prior to your surgery date call [phone]: Open Monday-Friday 8am-4pm    Remember:  Do not eat or drink after midnight the night before your surgery     Take these medicines the morning of surgery: chlorhexidine (PERIDEX)  Take these medications AS NEEDED WITH A SIP OF WATER:  acetaminophen (TYLENOL) albuterol (PROVENTIL) (2.5 MG/3ML) 0.083% nebulizer solution albuterol (VENTOLIN HFA) cyclobenzaprine (FLEXERIL) fluticasone (FLOVENT HFA)     As of today, STOP taking any Aspirin (unless otherwise instructed by your surgeon) Aleve, Naproxen, Ibuprofen, Motrin, Advil, Goody's, BC's, all herbal medications, fish oil, and all vitamins.  WHAT DO I DO ABOUT MY DIABETES MEDICATION?   Do not take metFORMIN (GLUCOPHAGE XR) the morning of surgery.    HOW TO MANAGE YOUR DIABETES BEFORE AND AFTER SURGERY  Why is it important to control my blood sugar before and after surgery? Improving blood sugar levels before and after surgery helps healing and can limit problems. A way of improving blood sugar control is eating a healthy diet by:  Eating less sugar and carbohydrates  Increasing activity/exercise  Talking with your doctor about reaching your blood sugar goals High blood sugars (greater than 180 mg/dL) can raise your risk of infections and slow your recovery, so you will need to focus on controlling your diabetes during the weeks before surgery. Make sure that the doctor who takes care of your diabetes knows about your planned surgery including the date and location.  How do I manage my blood sugar before surgery? Check your blood sugar at least 4 times a day, starting 2 days before surgery, to make  sure that the level is not too high or low.  Check your blood sugar the morning of your surgery when you wake up and every 2 hours until you get to the Short Stay unit.  If your blood sugar is less than 70 mg/dL, you will need to treat for low blood sugar: Treat a low blood sugar (less than 70 mg/dL) with  cup of clear juice (cranberry or apple), 4 glucose tablets, OR glucose gel. Recheck blood sugar in 15 minutes after treatment (to make sure it is greater than 70 mg/dL). If your blood sugar is not greater than 70 mg/dL on recheck, call [phone] for further instructions. Report your blood sugar to the short stay nurse when you get to Short Stay.  If you are admitted to the hospital after surgery: Your blood sugar will be checked by the staff and you will probably be given insulin after surgery (instead of oral diabetes medicines) to make sure you have good blood sugar levels. The goal for blood sugar control after surgery is 80-180 mg/dL.           Do not wear jewelry or makeup Do not wear lotions, powders, perfumes, or deodorant. Do not shave 48 hours prior to surgery.  Do not bring valuables to the hospital. DO Not wear nail polish, gel polish, artificial nails, or any other type of covering on  natural nails including finger and toenails. If patients have artificial nails, gel coating, etc. that need to be removed by a nail salon please have this removed prior to surgery or surgery may  need to be canceled/delayed if the surgeon/ anesthesia feels like the patient is unable to be adequately monitored.             [facility] is not responsible for any belongings or valuables.  Do NOT Smoke (Tobacco/Vaping) or drink Alcohol 24 hours prior to your procedure If you use a CPAP at night, you may bring all equipment for your overnight stay.   Contacts, glasses, dentures or bridgework may not be worn into surgery, please bring cases for these belongings   For patients admitted to the  hospital, discharge time will be determined by your treatment team.   Patients discharged the day of surgery will not be allowed to drive home, and someone needs to stay with them for 24 hours.  ONLY 1 SUPPORT PERSON MAY BE PRESENT WHILE YOU ARE IN SURGERY. IF YOU ARE TO BE ADMITTED ONCE YOU ARE IN YOUR ROOM YOU WILL BE ALLOWED TWO (2) VISITORS.  Minor children may have two parents present. Special consideration for safety and communication needs will be reviewed on a case by case basis.  Special instructions:    Oral Hygiene is also important to reduce your risk of infection.  Remember - BRUSH YOUR TEETH THE MORNING OF SURGERY WITH YOUR REGULAR TOOTHPASTE   [place]- Preparing For Surgery  Before surgery, you can play an important role. Because skin is not sterile, your skin needs to be as free of germs as possible. You can reduce the number of germs on your skin by washing with CHG (chlorahexidine gluconate) Soap before surgery.  CHG is an antiseptic cleaner which kills germs and bonds with the skin to continue killing germs even after washing.     Please do not use if you have an allergy to CHG or antibacterial soaps. If your skin becomes reddened/irritated stop using the CHG.  Do not shave (including legs and underarms) for at least 48 hours prior to first CHG shower. It is OK to shave your face.  Please follow these instructions carefully.     Shower the NIGHT BEFORE SURGERY and the MORNING OF SURGERY with CHG Soap.   If you chose to wash your hair, wash your hair first as usual with your normal shampoo. After you shampoo, rinse your hair and body thoroughly to remove the shampoo.  Then [employer] and genitals (private parts) with your normal soap and rinse thoroughly to remove soap.  After that Use CHG Soap as you would any other liquid soap. You can apply CHG directly to the skin and wash gently with a scrungie or a clean washcloth.   Apply the CHG Soap to your body ONLY FROM THE  NECK DOWN.  Do not use on open wounds or open sores. Avoid contact with your eyes, ears, mouth and genitals (private parts). Wash Face and genitals (private parts)  with your normal soap.   Wash thoroughly, paying special attention to the area where your surgery will be performed.  Thoroughly rinse your body with warm water from the neck down.  DO NOT shower/wash with your normal soap after using and rinsing off the CHG Soap.  Pat yourself dry with a CLEAN TOWEL.  Wear CLEAN PAJAMAS to bed the night before surgery  Place CLEAN SHEETS on your bed the night before your surgery  DO NOT SLEEP WITH PETS.   Day of Surgery:  Take a shower with CHG soap. Wear Clean/Comfortable clothing the morning of surgery Do not apply any deodorants/lotions.  Remember to brush your teeth WITH YOUR REGULAR TOOTHPASTE.   Please read over the following fact sheets that you were given.

## 2020-09-09 ENCOUNTER — Encounter (HOSPITAL_COMMUNITY): Payer: Self-pay

## 2020-09-09 ENCOUNTER — Other Ambulatory Visit: Payer: Self-pay

## 2020-09-09 ENCOUNTER — Encounter (HOSPITAL_COMMUNITY)
Admission: RE | Admit: 2020-09-09 | Discharge: 2020-09-09 | Disposition: A | Discharge status: Home / Self Care | Payer: Medicaid Other | Source: Ambulatory Visit | Attending: Neurosurgery | Admitting: Neurosurgery

## 2020-09-09 DIAGNOSIS — E1122 Type 2 diabetes mellitus with diabetic chronic kidney disease: Secondary | ICD-10-CM | POA: Diagnosis not present

## 2020-09-09 DIAGNOSIS — Z7984 Long term (current) use of oral hypoglycemic drugs: Secondary | ICD-10-CM | POA: Insufficient documentation

## 2020-09-09 DIAGNOSIS — Z6841 Body Mass Index (BMI) 40.0 and over, adult: Secondary | ICD-10-CM | POA: Diagnosis not present

## 2020-09-09 DIAGNOSIS — Z7951 Long term (current) use of inhaled steroids: Secondary | ICD-10-CM | POA: Diagnosis not present

## 2020-09-09 DIAGNOSIS — I129 Hypertensive chronic kidney disease with stage 1 through stage 4 chronic kidney disease, or unspecified chronic kidney disease: Secondary | ICD-10-CM | POA: Diagnosis not present

## 2020-09-09 DIAGNOSIS — F32A Depression, unspecified: Secondary | ICD-10-CM | POA: Diagnosis not present

## 2020-09-09 DIAGNOSIS — Z90710 Acquired absence of both cervix and uterus: Secondary | ICD-10-CM | POA: Insufficient documentation

## 2020-09-09 DIAGNOSIS — Z20822 Contact with and (suspected) exposure to covid-19: Secondary | ICD-10-CM | POA: Insufficient documentation

## 2020-09-09 DIAGNOSIS — M5124 Other intervertebral disc displacement, thoracic region: Secondary | ICD-10-CM | POA: Diagnosis not present

## 2020-09-09 DIAGNOSIS — N189 Chronic kidney disease, unspecified: Secondary | ICD-10-CM | POA: Insufficient documentation

## 2020-09-09 DIAGNOSIS — F419 Anxiety disorder, unspecified: Secondary | ICD-10-CM | POA: Diagnosis not present

## 2020-09-09 DIAGNOSIS — Z01812 Encounter for preprocedural laboratory examination: Secondary | ICD-10-CM | POA: Insufficient documentation

## 2020-09-09 DIAGNOSIS — Z79899 Other long term (current) drug therapy: Secondary | ICD-10-CM | POA: Insufficient documentation

## 2020-09-09 DIAGNOSIS — D631 Anemia in chronic kidney disease: Secondary | ICD-10-CM | POA: Insufficient documentation

## 2020-09-09 HISTORY — DX: Anxiety disorder, unspecified: F41.9

## 2020-09-09 HISTORY — DX: Anemia, unspecified: D64.9

## 2020-09-09 HISTORY — DX: Type 2 diabetes mellitus without complications: E11.9

## 2020-09-09 HISTORY — DX: Depression, unspecified: F32.A

## 2020-09-09 LAB — CBC WITH DIFFERENTIAL/PLATELET
Abs Immature Granulocytes: 0.04 10*3/uL (ref 0.00–0.07)
Basophils Absolute: 0.1 10*3/uL (ref 0.0–0.1)
Basophils Relative: 1 %
Eosinophils Absolute: 0.3 10*3/uL (ref 0.0–0.5)
Eosinophils Relative: 3 %
HCT: 38.9 % (ref 36.0–46.0)
Hemoglobin: 11.7 g/dL — ABNORMAL LOW (ref 12.0–15.0)
Immature Granulocytes: 0 %
Lymphocytes Relative: 31 %
Lymphs Abs: 3.3 10*3/uL (ref 0.7–4.0)
MCH: 25 pg — ABNORMAL LOW (ref 26.0–34.0)
MCHC: 30.1 g/dL (ref 30.0–36.0)
MCV: 83.1 fL (ref 80.0–100.0)
Monocytes Absolute: 0.8 10*3/uL (ref 0.1–1.0)
Monocytes Relative: 8 %
Neutro Abs: 6.1 10*3/uL (ref 1.7–7.7)
Neutrophils Relative %: 57 %
Platelets: 268 10*3/uL (ref 150–400)
RBC: 4.68 MIL/uL (ref 3.87–5.11)
RDW: 14.2 % (ref 11.5–15.5)
WBC: 10.6 10*3/uL — ABNORMAL HIGH (ref 4.0–10.5)
nRBC: 0 % (ref 0.0–0.2)

## 2020-09-09 LAB — BASIC METABOLIC PANEL
Anion gap: 7 (ref 5–15)
BUN: 10 mg/dL (ref 6–20)
CO2: 23 mmol/L (ref 22–32)
Calcium: 9.6 mg/dL (ref 8.9–10.3)
Chloride: 110 mmol/L (ref 98–111)
Creatinine, Ser: 1.08 mg/dL — ABNORMAL HIGH (ref 0.44–1.00)
GFR, Estimated: >60 mL/min (ref >60)
Glucose, Bld: 109 mg/dL — ABNORMAL HIGH (ref 70–99)
Potassium: 4.1 mmol/L (ref 3.5–5.1)
Sodium: 140 mmol/L (ref 135–145)

## 2020-09-09 LAB — GLUCOSE, CAPILLARY: Glucose-Capillary: 109 mg/dL — ABNORMAL HIGH (ref 70–99)

## 2020-09-09 LAB — SURGICAL PCR SCREEN
MRSA, PCR: NEGATIVE
Staphylococcus aureus: NEGATIVE

## 2020-09-09 LAB — SARS CORONAVIRUS 2 (TAT 6-24 HRS): SARS Coronavirus 2: NEGATIVE

## 2020-09-09 NOTE — Progress Notes (Signed)
PCP - Denyce Robert [facility] in [place]) Cardiologist - pt denies  PPM/ICD - n/a  Chest x-ray - n/a EKG - 09/09/20 in PAT Stress Test - pt denies ECHO - pt denies Cardiac Cath - pt denies  Sleep Study - pt denies  Fasting Blood Sugar - 100-110 Checks Blood Sugar once a day  Blood Thinner Instructions: n/a Aspirin Instructions: n/a  NPO at midnight  COVID TEST- 09/09/20  Anesthesia review: abnormal EKG  Patient denies shortness of breath, fever, cough and chest pain at PAT appointment   All instructions explained to the patient, with a verbal understanding of the material. Patient agrees to go over the instructions while at home for a better understanding. Patient also instructed to self quarantine after being tested for COVID-19. The opportunity to ask questions was provided.

## 2020-09-12 NOTE — Progress Notes (Signed)
Anesthesia Chart Review:  Case: [id number] Date/Time: 09/13/20 0745   Procedure: Microdiscectomy - left - T8-T9 - T10-T11 (Left: Back)   Anesthesia type: General   Pre-op diagnosis: HNP   Location: [facility] OR ROOM 20 / [place] [facility]   Surgeons: Earnie Larsson, MD       DISCUSSION: Patient is a 50 year old female scheduled for the above procedure.   History includes never smoker, HTN, asthma, CKD, DM2, anemia, anxiety, depression, hysterectomy (08/13/14, for fibroid, endometriosis), BSO (07/28/15; done for rectovaginal endometriosis in "attempt to control her symptoms and but avoid a radical surgery"). BMI is consistent with morbid obesity.   I don't see that an A1c was done with PAT labs. Last A1c seen was 7.1% on 11/23/19. She reported home fasting CBGs ~ 100-110.  Glucose with PAT labs was 109.  She will get a CBG on arrival for surgery.   09/09/2020 presurgical COVID-19 test was negative.  Anesthesia team to evaluate on the day of surgery.   VS: BP (!) 154/87   Pulse 94   Temp 36.9 C (Oral)   Resp 18   Ht '5\' 2"'$  (1.575 m)   Wt 101.6 kg   LMP 08/02/2014   SpO2 100%   BMI 40.97 kg/m    PROVIDERS: Burnard Hawthorne, FNP is PCP [facility] in [place])   LABS: Labs reviewed: Acceptable for surgery. (all labs ordered are listed, but only abnormal results are displayed)  Labs Reviewed  GLUCOSE, CAPILLARY - Abnormal; Notable for the following components:      Result Value   Glucose-Capillary 109 (*)    All other components within normal limits  CBC WITH DIFFERENTIAL/PLATELET - Abnormal; Notable for the following components:   WBC 10.6 (*)    Hemoglobin 11.7 (*)    MCH 25.0 (*)    All other components within normal limits  BASIC METABOLIC PANEL - Abnormal; Notable for the following components:   Glucose, Bld 109 (*)    Creatinine, Ser 1.08 (*)    All other components within normal limits  SURGICAL PCR SCREEN  SARS CORONAVIRUS 2 (TAT 6-24 HRS)     IMAGES: MRI T-spine  09/19/19: IMPRESSION: - Thoracic spondylosis as outlined and most notably as follows. - At T8-T9, there is a sizable left center/foraminal disc extrusion. Resultant severe spinal canal stenosis with rightward displacement of the spinal cord and significant spinal cord impingement. T2 hyperintense signal abnormality within the spinal cord at this level which may reflect focal edema or myelomalacia. The disc herniation also contributes to moderate left neural foraminal narrowing. - Smaller disc herniations are present at the T5-T6, T6-T7, T9-T10 and T10-T11 levels. Some of these herniations contact the ventral spinal cord, but there is no more than mild relative central canal stenosis at these levels. - Also of note, there is moderate disc degeneration at the T6-T7 through T11-T12 levels. - Incompletely imaged dorsal subcutaneous edema overlying the lower thoracic and visualized upper lumbar spine.    EKG: 09/09/20: Normal sinus rhythm Incomplete right bundle branch block Cannot rule out Anterior infarct , age undetermined Abnormal ECG Confirmed by Cherlynn Kaiser [phone]) on 09/11/2020 6:03:25 AM - Incomplete RBBB is new when compared to 12/16/15 tracing, otherwise not significantly changed.    CV: N/A   Past Medical History:  Diagnosis Date   Anemia    Anxiety    Asthma 02/12/2006   Bronchitis    Chronic kidney disease    "declining kidney, but it's stable right now"   Depression  Diabetes mellitus without complication ([place])    Fibroids    Hypertension 02/13/2011   Started on lisinopril   Pneumonia    Pt reports she had pneumonia at 50 years of age    Past Surgical History:  Procedure Laterality Date   DILATION AND CURETTAGE OF UTERUS  08/2011   HYSTEROSCOPY  02/15/2012   Fibroid Removal   HYSTEROSCOPY  may 2014   ROBOTIC ASSISTED SALPINGO OOPHERECTOMY Bilateral 07/28/2015   Procedure: XI ROBOTIC ASSISTED BILATERAL SALPINGO OOPHORECTOMY AND LYSIS OF ADHESIONS;   Surgeon: Everitt Amber, MD;  Location: [facility] ORS;  Service: Gynecology;  Laterality: Bilateral;   SUPRACERVICAL ABDOMINAL HYSTERECTOMY N/A 08/31/2014   Procedure: HYSTERECTOMY SUPRACERVICAL ABDOMINAL;  Surgeon: [name], MD;  Location: [facility] ORS;  Service: Gynecology;  Laterality: N/A;  Dr. Elonda Husky will assist    MEDICATIONS:  acetaminophen (TYLENOL 8 HOUR) 650 MG CR tablet   acetaminophen (TYLENOL) 500 MG tablet   albuterol (PROVENTIL) (2.5 MG/3ML) 0.083% nebulizer solution   albuterol (VENTOLIN HFA) 108 (90 Base) MCG/ACT inhaler   amLODipine (NORVASC) 5 MG tablet   chlorhexidine (PERIDEX) 0.12 % solution   Cholecalciferol 10 MCG (400 UNIT) CAPS   cyclobenzaprine (FLEXERIL) 5 MG tablet   fluticasone (FLOVENT HFA) 110 MCG/ACT inhaler   gabapentin (NEURONTIN) 100 MG capsule   lidocaine (LIDODERM) 5 %   lisinopril (ZESTRIL) 5 MG tablet   metFORMIN (GLUCOPHAGE XR) 500 MG 24 hr tablet   naproxen (NAPROSYN) 375 MG tablet   rosuvastatin (CRESTOR) 20 MG tablet   traZODone (DESYREL) 50 MG tablet   No current facility-administered medications for this encounter.    Myra Gianotti, PA-C Surgical Short Stay/Anesthesiology [facility] Phone [phone] [facility] Phone [phone] 09/12/2020 1:58 PM

## 2020-09-12 NOTE — Anesthesia Preprocedure Evaluation (Addendum)
Anesthesia Evaluation  Patient identified by MRN, date of birth, ID band Patient awake    Reviewed: Allergy & Precautions, NPO status , Patient's Chart, lab work & pertinent test results  Airway Mallampati: III  TM Distance: >3 FB Neck ROM: Full    Dental  (+) Teeth Intact, Dental Advisory Given   Pulmonary asthma ,    Pulmonary exam normal breath sounds clear to auscultation       Cardiovascular hypertension, Pt. on medications Normal cardiovascular exam Rhythm:Regular Rate:Normal  EKG: 09/09/20: Normal sinus rhythm Incomplete right bundle branch block Cannot rule out Anterior infarct , age undetermined Abnormal ECG Confirmed by Cherlynn Kaiser [phone]) on 09/11/2020 6:03:25 AM - Incomplete RBBB is new when compared to 12/16/15 tracing, otherwise not significantly changed.    Neuro/Psych PSYCHIATRIC DISORDERS Anxiety Depression HNP    GI/Hepatic negative GI ROS, Neg liver ROS,   Endo/Other  diabetes, Type 2, Insulin DependentMorbid obesity  Renal/GU Renal InsufficiencyRenal disease     Musculoskeletal negative musculoskeletal ROS (+)   Abdominal   Peds  Hematology  (+) Blood dyscrasia, anemia ,   Anesthesia Other Findings   Reproductive/Obstetrics                           Anesthesia Physical Anesthesia Plan  ASA: 3  Anesthesia Plan: General   Post-op Pain Management:    Induction: Intravenous  PONV Risk Score and Plan: 3 and Midazolam, Dexamethasone and Ondansetron  Airway Management Planned: Oral ETT  Additional Equipment:   Intra-op Plan:   Post-operative Plan: Extubation in OR  Informed Consent: I have reviewed the patients History and Physical, chart, labs and discussed the procedure including the risks, benefits and alternatives for the proposed anesthesia with the patient or authorized representative who has indicated his/her understanding and acceptance.     Dental  advisory given  Plan Discussed with: CRNA  Anesthesia Plan Comments: (PAT note written 09/12/2020 by Myra Gianotti, PA-C. )      Anesthesia Quick Evaluation

## 2020-09-13 ENCOUNTER — Encounter (HOSPITAL_COMMUNITY)
Admission: RE | Disposition: A | Discharge status: Home / Self Care | Payer: Self-pay | Source: Home / Self Care | Attending: Neurosurgery

## 2020-09-13 ENCOUNTER — Ambulatory Visit (HOSPITAL_COMMUNITY): Payer: Medicaid Other | Admitting: Vascular Surgery

## 2020-09-13 ENCOUNTER — Encounter (HOSPITAL_COMMUNITY): Payer: Self-pay | Admitting: Neurosurgery

## 2020-09-13 ENCOUNTER — Observation Stay (HOSPITAL_COMMUNITY)
Admission: RE | Admit: 2020-09-13 | Discharge: 2020-09-14 | Disposition: A | Discharge status: Home / Self Care | Payer: Medicaid Other | Attending: Neurosurgery | Admitting: Neurosurgery

## 2020-09-13 ENCOUNTER — Ambulatory Visit (HOSPITAL_COMMUNITY): Payer: Medicaid Other

## 2020-09-13 ENCOUNTER — Ambulatory Visit (HOSPITAL_COMMUNITY): Payer: Medicaid Other | Admitting: Anesthesiology

## 2020-09-13 ENCOUNTER — Other Ambulatory Visit: Payer: Self-pay

## 2020-09-13 DIAGNOSIS — Z7984 Long term (current) use of oral hypoglycemic drugs: Secondary | ICD-10-CM | POA: Insufficient documentation

## 2020-09-13 DIAGNOSIS — E785 Hyperlipidemia, unspecified: Secondary | ICD-10-CM | POA: Diagnosis not present

## 2020-09-13 DIAGNOSIS — Z79899 Other long term (current) drug therapy: Secondary | ICD-10-CM | POA: Diagnosis not present

## 2020-09-13 DIAGNOSIS — J45909 Unspecified asthma, uncomplicated: Secondary | ICD-10-CM | POA: Insufficient documentation

## 2020-09-13 DIAGNOSIS — F418 Other specified anxiety disorders: Secondary | ICD-10-CM | POA: Diagnosis not present

## 2020-09-13 DIAGNOSIS — Z9889 Other specified postprocedural states: Secondary | ICD-10-CM | POA: Diagnosis not present

## 2020-09-13 DIAGNOSIS — M5104 Intervertebral disc disorders with myelopathy, thoracic region: Secondary | ICD-10-CM | POA: Diagnosis not present

## 2020-09-13 DIAGNOSIS — N189 Chronic kidney disease, unspecified: Secondary | ICD-10-CM | POA: Diagnosis not present

## 2020-09-13 DIAGNOSIS — I129 Hypertensive chronic kidney disease with stage 1 through stage 4 chronic kidney disease, or unspecified chronic kidney disease: Secondary | ICD-10-CM | POA: Insufficient documentation

## 2020-09-13 DIAGNOSIS — J4520 Mild intermittent asthma, uncomplicated: Secondary | ICD-10-CM | POA: Diagnosis not present

## 2020-09-13 DIAGNOSIS — Z419 Encounter for procedure for purposes other than remedying health state, unspecified: Secondary | ICD-10-CM

## 2020-09-13 DIAGNOSIS — E1122 Type 2 diabetes mellitus with diabetic chronic kidney disease: Secondary | ICD-10-CM | POA: Diagnosis not present

## 2020-09-13 HISTORY — PX: THORACIC DISCECTOMY: SHX6113

## 2020-09-13 LAB — GLUCOSE, CAPILLARY
Glucose-Capillary: 109 mg/dL — ABNORMAL HIGH (ref 70–99)
Glucose-Capillary: 156 mg/dL — ABNORMAL HIGH (ref 70–99)
Glucose-Capillary: 133 mg/dL — ABNORMAL HIGH (ref 70–99)
Glucose-Capillary: 157 mg/dL — ABNORMAL HIGH (ref 70–99)

## 2020-09-13 LAB — HEMOGLOBIN A1C
Hgb A1c MFr Bld: 6.8 % — ABNORMAL HIGH (ref 4.8–5.6)
Mean Plasma Glucose: 148.46 mg/dL

## 2020-09-13 SURGERY — THORACIC DISCECTOMY
Anesthesia: General | Site: Spine Thoracic | Laterality: Left

## 2020-09-13 MED ORDER — PROPOFOL 10 MG/ML IV BOLUS
INTRAVENOUS | Status: DC | PRN
  Administered 2020-09-13: 170 mg via INTRAVENOUS

## 2020-09-13 MED ORDER — FENTANYL CITRATE (PF) 100 MCG/2ML IJ SOLN
INTRAMUSCULAR | Status: AC
  Administered 2020-09-13: 25 ug
  Filled 2020-09-13: qty 2

## 2020-09-13 MED ORDER — ONDANSETRON HCL 4 MG/2ML IJ SOLN
INTRAMUSCULAR | Status: AC
  Filled 2020-09-13: qty 2

## 2020-09-13 MED ORDER — ALBUTEROL SULFATE HFA 108 (90 BASE) MCG/ACT IN AERS: 2.0000 | INHALATION_SPRAY | Freq: Four times a day (QID) | RESPIRATORY_TRACT | Status: DC | PRN

## 2020-09-13 MED ORDER — TRAZODONE HCL 50 MG PO TABS
50.0000 mg | ORAL_TABLET | Freq: Every day | ORAL | Status: DC
  Administered 2020-09-13: 50 mg via ORAL
  Filled 2020-09-13: qty 1

## 2020-09-13 MED ORDER — CHLORHEXIDINE GLUCONATE 0.12 % MT SOLN
15.0000 mL | Freq: Two times a day (BID) | OROMUCOSAL | Status: DC
  Administered 2020-09-14: 15 mL via OROMUCOSAL
  Filled 2020-09-13 (×4): qty 15

## 2020-09-13 MED ORDER — LIDOCAINE 2% (20 MG/ML) 5 ML SYRINGE
INTRAMUSCULAR | Status: DC | PRN
  Administered 2020-09-13: 100 mg via INTRAVENOUS

## 2020-09-13 MED ORDER — PROMETHAZINE HCL 25 MG/ML IJ SOLN
6.2500 mg | INTRAMUSCULAR | Status: DC | PRN
  Administered 2020-09-13: 6.25 mg via INTRAVENOUS

## 2020-09-13 MED ORDER — CYCLOBENZAPRINE HCL 10 MG PO TABS
10.0000 mg | ORAL_TABLET | Freq: Three times a day (TID) | ORAL | Status: DC | PRN
  Administered 2020-09-13: 10 mg via ORAL
  Filled 2020-09-13: qty 1

## 2020-09-13 MED ORDER — ONDANSETRON HCL 4 MG PO TABS: 4.0000 mg | ORAL_TABLET | Freq: Four times a day (QID) | ORAL | Status: DC | PRN

## 2020-09-13 MED ORDER — SODIUM CHLORIDE 0.9 % IV SOLN: 250.0000 mL | INTRAVENOUS | Status: DC

## 2020-09-13 MED ORDER — BUPIVACAINE HCL (PF) 0.25 % IJ SOLN
INTRAMUSCULAR | Status: DC | PRN
  Administered 2020-09-13: 20 mL

## 2020-09-13 MED ORDER — ACETAMINOPHEN 325 MG PO TABS
650.0000 mg | ORAL_TABLET | ORAL | Status: DC | PRN
  Administered 2020-09-14: 650 mg via ORAL
  Filled 2020-09-13: qty 2

## 2020-09-13 MED ORDER — DEXAMETHASONE SODIUM PHOSPHATE 10 MG/ML IJ SOLN
10.0000 mg | Freq: Once | INTRAMUSCULAR | Status: AC
  Administered 2020-09-13: 4 mg via INTRAVENOUS

## 2020-09-13 MED ORDER — LIDOCAINE 2% (20 MG/ML) 5 ML SYRINGE
INTRAMUSCULAR | Status: AC
  Filled 2020-09-13: qty 5

## 2020-09-13 MED ORDER — ONDANSETRON HCL 4 MG/2ML IJ SOLN: 4.0000 mg | Freq: Four times a day (QID) | INTRAMUSCULAR | Status: DC | PRN

## 2020-09-13 MED ORDER — PROPOFOL 10 MG/ML IV BOLUS
INTRAVENOUS | Status: AC
  Filled 2020-09-13: qty 20

## 2020-09-13 MED ORDER — MIDAZOLAM HCL 2 MG/2ML IJ SOLN
INTRAMUSCULAR | Status: AC
  Filled 2020-09-13: qty 2

## 2020-09-13 MED ORDER — ROSUVASTATIN CALCIUM 20 MG PO TABS
20.0000 mg | ORAL_TABLET | Freq: Every day | ORAL | Status: DC
  Administered 2020-09-13: 20 mg via ORAL
  Filled 2020-09-13: qty 1

## 2020-09-13 MED ORDER — HYDROCODONE-ACETAMINOPHEN 5-325 MG PO TABS
1.0000 | ORAL_TABLET | ORAL | Status: DC | PRN
Start: 2020-09-13 — End: 2020-09-14
  Administered 2020-09-14: 1 via ORAL
  Filled 2020-09-13: qty 1

## 2020-09-13 MED ORDER — SUGAMMADEX SODIUM 200 MG/2ML IV SOLN
INTRAVENOUS | Status: DC | PRN
  Administered 2020-09-13: 200 mg via INTRAVENOUS

## 2020-09-13 MED ORDER — SODIUM CHLORIDE 0.9% FLUSH: 3.0000 mL | INTRAVENOUS | Status: DC | PRN

## 2020-09-13 MED ORDER — PROMETHAZINE HCL 25 MG/ML IJ SOLN: 6.2500 mg | INTRAMUSCULAR | Status: DC | PRN

## 2020-09-13 MED ORDER — ONDANSETRON HCL 4 MG/2ML IJ SOLN
INTRAMUSCULAR | Status: DC | PRN
  Administered 2020-09-13: 4 mg via INTRAVENOUS

## 2020-09-13 MED ORDER — GLYCOPYRROLATE PF 0.2 MG/ML IJ SOSY
PREFILLED_SYRINGE | INTRAMUSCULAR | Status: AC
  Filled 2020-09-13: qty 1

## 2020-09-13 MED ORDER — HEMOSTATIC AGENTS (NO CHARGE) OPTIME
TOPICAL | Status: DC | PRN
  Administered 2020-09-13: 1 via TOPICAL

## 2020-09-13 MED ORDER — FENTANYL CITRATE (PF) 100 MCG/2ML IJ SOLN
25.0000 ug | INTRAMUSCULAR | Status: DC | PRN
  Administered 2020-09-13 (×2): 25 ug via INTRAVENOUS

## 2020-09-13 MED ORDER — CHLORHEXIDINE GLUCONATE 0.12 % MT SOLN
15.0000 mL | OROMUCOSAL | Status: AC
  Administered 2020-09-13: 15 mL via OROMUCOSAL
  Filled 2020-09-13: qty 15

## 2020-09-13 MED ORDER — THROMBIN 5000 UNITS EX SOLR
CUTANEOUS | Status: DC | PRN
  Administered 2020-09-13 (×2): 5000 [IU] via TOPICAL

## 2020-09-13 MED ORDER — ALBUTEROL SULFATE (2.5 MG/3ML) 0.083% IN NEBU: 2.5000 mg | INHALATION_SOLUTION | Freq: Four times a day (QID) | RESPIRATORY_TRACT | Status: DC | PRN

## 2020-09-13 MED ORDER — PROMETHAZINE HCL 25 MG/ML IJ SOLN
INTRAMUSCULAR | Status: AC
  Filled 2020-09-13: qty 1

## 2020-09-13 MED ORDER — CHLORHEXIDINE GLUCONATE CLOTH 2 % EX PADS: 6.0000 | MEDICATED_PAD | Freq: Once | CUTANEOUS | Status: DC

## 2020-09-13 MED ORDER — KETAMINE HCL 10 MG/ML IJ SOLN
INTRAMUSCULAR | Status: DC | PRN
  Administered 2020-09-13: 50 mg via INTRAVENOUS

## 2020-09-13 MED ORDER — LACTATED RINGERS IV SOLN: INTRAVENOUS | Status: DC

## 2020-09-13 MED ORDER — HYDROCODONE-ACETAMINOPHEN 10-325 MG PO TABS
2.0000 | ORAL_TABLET | ORAL | Status: DC | PRN
  Administered 2020-09-13 – 2020-09-14 (×2): 2 via ORAL
  Filled 2020-09-13 (×2): qty 2

## 2020-09-13 MED ORDER — PHENYLEPHRINE 40 MCG/ML (10ML) SYRINGE FOR IV PUSH (FOR BLOOD PRESSURE SUPPORT)
PREFILLED_SYRINGE | INTRAVENOUS | Status: AC
  Filled 2020-09-13: qty 10

## 2020-09-13 MED ORDER — FENTANYL CITRATE (PF) 250 MCG/5ML IJ SOLN
INTRAMUSCULAR | Status: AC
  Filled 2020-09-13: qty 5

## 2020-09-13 MED ORDER — DEXAMETHASONE SODIUM PHOSPHATE 10 MG/ML IJ SOLN
INTRAMUSCULAR | Status: AC
  Filled 2020-09-13: qty 1

## 2020-09-13 MED ORDER — OXYCODONE HCL 5 MG PO TABS
5.0000 mg | ORAL_TABLET | Freq: Once | ORAL | Status: AC
  Administered 2020-09-13: 5 mg via ORAL

## 2020-09-13 MED ORDER — HYDROMORPHONE HCL 1 MG/ML IJ SOLN: 1.0000 mg | INTRAMUSCULAR | Status: DC | PRN

## 2020-09-13 MED ORDER — LISINOPRIL 10 MG PO TABS
5.0000 mg | ORAL_TABLET | Freq: Every day | ORAL | Status: DC
  Administered 2020-09-13: 5 mg via ORAL
  Filled 2020-09-13: qty 1

## 2020-09-13 MED ORDER — SUCCINYLCHOLINE CHLORIDE 200 MG/10ML IV SOSY
PREFILLED_SYRINGE | INTRAVENOUS | Status: AC
  Filled 2020-09-13: qty 10

## 2020-09-13 MED ORDER — SODIUM CHLORIDE 0.9% FLUSH
3.0000 mL | Freq: Two times a day (BID) | INTRAVENOUS | Status: DC
  Administered 2020-09-13: 3 mL via INTRAVENOUS

## 2020-09-13 MED ORDER — MENTHOL 3 MG MT LOZG: 1.0000 | LOZENGE | OROMUCOSAL | Status: DC | PRN

## 2020-09-13 MED ORDER — BUDESONIDE 0.25 MG/2ML IN SUSP
0.2500 mg | Freq: Two times a day (BID) | RESPIRATORY_TRACT | Status: DC
  Administered 2020-09-13 – 2020-09-14 (×2): 0.25 mg via RESPIRATORY_TRACT
  Filled 2020-09-13 (×4): qty 2

## 2020-09-13 MED ORDER — THROMBIN 5000 UNITS EX SOLR
CUTANEOUS | Status: AC
  Filled 2020-09-13: qty 10000

## 2020-09-13 MED ORDER — ROCURONIUM BROMIDE 10 MG/ML (PF) SYRINGE
PREFILLED_SYRINGE | INTRAVENOUS | Status: AC
  Filled 2020-09-13: qty 10

## 2020-09-13 MED ORDER — CEFAZOLIN SODIUM-DEXTROSE 1-4 GM/50ML-% IV SOLN
1.0000 g | Freq: Three times a day (TID) | INTRAVENOUS | Status: AC
  Administered 2020-09-13 (×2): 1 g via INTRAVENOUS
  Filled 2020-09-13 (×2): qty 50

## 2020-09-13 MED ORDER — AMLODIPINE BESYLATE 5 MG PO TABS
5.0000 mg | ORAL_TABLET | Freq: Every day | ORAL | Status: DC
  Administered 2020-09-13: 5 mg via ORAL
  Filled 2020-09-13: qty 1

## 2020-09-13 MED ORDER — METFORMIN HCL ER 500 MG PO TB24
500.0000 mg | ORAL_TABLET | Freq: Two times a day (BID) | ORAL | Status: DC
  Administered 2020-09-13 – 2020-09-14 (×2): 500 mg via ORAL
  Filled 2020-09-13 (×2): qty 1

## 2020-09-13 MED ORDER — ROCURONIUM BROMIDE 100 MG/10ML IV SOLN
INTRAVENOUS | Status: DC | PRN
  Administered 2020-09-13: 10 mg via INTRAVENOUS
  Administered 2020-09-13: 70 mg via INTRAVENOUS

## 2020-09-13 MED ORDER — PHENOL 1.4 % MT LIQD: 1.0000 | OROMUCOSAL | Status: DC | PRN

## 2020-09-13 MED ORDER — 0.9 % SODIUM CHLORIDE (POUR BTL) OPTIME
TOPICAL | Status: DC | PRN
  Administered 2020-09-13: 1000 mL

## 2020-09-13 MED ORDER — FENTANYL CITRATE (PF) 100 MCG/2ML IJ SOLN
25.0000 ug | INTRAMUSCULAR | Status: DC | PRN
  Administered 2020-09-13: 25 ug via INTRAVENOUS

## 2020-09-13 MED ORDER — CHOLECALCIFEROL 10 MCG (400 UNIT) PO TABS
400.0000 [IU] | ORAL_TABLET | Freq: Every day | ORAL | Status: DC
  Administered 2020-09-14: 400 [IU] via ORAL
  Filled 2020-09-13: qty 1

## 2020-09-13 MED ORDER — GABAPENTIN 100 MG PO CAPS
200.0000 mg | ORAL_CAPSULE | Freq: Every day | ORAL | Status: DC
  Administered 2020-09-13: 200 mg via ORAL
  Filled 2020-09-13: qty 2

## 2020-09-13 MED ORDER — ACETAMINOPHEN 650 MG RE SUPP: 650.0000 mg | RECTAL | Status: DC | PRN

## 2020-09-13 MED ORDER — PHENYLEPHRINE 40 MCG/ML (10ML) SYRINGE FOR IV PUSH (FOR BLOOD PRESSURE SUPPORT)
PREFILLED_SYRINGE | INTRAVENOUS | Status: DC | PRN
  Administered 2020-09-13 (×3): 80 ug via INTRAVENOUS

## 2020-09-13 MED ORDER — MIDAZOLAM HCL 5 MG/5ML IJ SOLN
INTRAMUSCULAR | Status: DC | PRN
  Administered 2020-09-13: 2 mg via INTRAVENOUS

## 2020-09-13 MED ORDER — KETAMINE HCL 50 MG/5ML IJ SOSY
PREFILLED_SYRINGE | INTRAMUSCULAR | Status: AC
  Filled 2020-09-13: qty 5

## 2020-09-13 MED ORDER — ACETAMINOPHEN 500 MG PO TABS
1000.0000 mg | ORAL_TABLET | Freq: Once | ORAL | Status: AC
  Administered 2020-09-13: 1000 mg via ORAL
  Filled 2020-09-13: qty 2

## 2020-09-13 MED ORDER — CEFAZOLIN SODIUM-DEXTROSE 2-4 GM/100ML-% IV SOLN
2.0000 g | INTRAVENOUS | Status: AC
  Administered 2020-09-13: 2 g via INTRAVENOUS
  Filled 2020-09-13: qty 100

## 2020-09-13 MED ORDER — BUPIVACAINE HCL (PF) 0.25 % IJ SOLN
INTRAMUSCULAR | Status: AC
  Filled 2020-09-13: qty 30

## 2020-09-13 MED ORDER — OXYCODONE HCL 5 MG PO TABS
ORAL_TABLET | ORAL | Status: AC
  Filled 2020-09-13: qty 1

## 2020-09-13 MED ORDER — KETOROLAC TROMETHAMINE 30 MG/ML IJ SOLN
INTRAMUSCULAR | Status: DC | PRN
  Administered 2020-09-13: 30 mg via INTRAVENOUS

## 2020-09-13 MED ORDER — FENTANYL CITRATE (PF) 100 MCG/2ML IJ SOLN
INTRAMUSCULAR | Status: DC | PRN
  Administered 2020-09-13: 100 ug via INTRAVENOUS
  Administered 2020-09-13 (×2): 50 ug via INTRAVENOUS

## 2020-09-13 MED ORDER — KETOROLAC TROMETHAMINE 15 MG/ML IJ SOLN
30.0000 mg | Freq: Four times a day (QID) | INTRAMUSCULAR | Status: AC
  Administered 2020-09-13 – 2020-09-14 (×4): 30 mg via INTRAVENOUS
  Filled 2020-09-13 (×4): qty 2

## 2020-09-13 MED ORDER — CYCLOBENZAPRINE HCL 10 MG PO TABS
ORAL_TABLET | ORAL | Status: AC
  Filled 2020-09-13: qty 1

## 2020-09-13 SURGICAL SUPPLY — 48 items
ADH SKN CLS APL DERMABOND .7 (GAUZE/BANDAGES/DRESSINGS) ×1
APL SKNCLS STERI-STRIP NONHPOA (GAUZE/BANDAGES/DRESSINGS) ×1
BAG COUNTER SPONGE SURGICOUNT (BAG) ×3 IMPLANT
BAG SPNG CNTER NS LX DISP (BAG) ×2
BAG SURGICOUNT SPONGE COUNTING (BAG) ×2
BAND INSRT 18 STRL LF DISP RB (MISCELLANEOUS) ×2
BAND RUBBER #18 3X1/16 STRL (MISCELLANEOUS) ×6 IMPLANT
BENZOIN TINCTURE PRP APPL 2/3 (GAUZE/BANDAGES/DRESSINGS) ×2 IMPLANT
BUR CUTTER 7.0 ROUND (BURR) ×3 IMPLANT
BUR MATCHSTICK NEURO 3.0 LAGG (BURR) ×2 IMPLANT
CANISTER SUCT 3000ML PPV (MISCELLANEOUS) ×3 IMPLANT
CARTRIDGE OIL MAESTRO DRILL (MISCELLANEOUS) ×1 IMPLANT
CLOSURE WOUND 1/2 X4 (GAUZE/BANDAGES/DRESSINGS) ×1
DERMABOND ADVANCED (GAUZE/BANDAGES/DRESSINGS) ×2
DERMABOND ADVANCED .7 DNX12 (GAUZE/BANDAGES/DRESSINGS) ×1 IMPLANT
DIFFUSER DRILL AIR PNEUMATIC (MISCELLANEOUS) ×3 IMPLANT
DRAPE HALF SHEET 40X57 (DRAPES) ×3 IMPLANT
DRAPE LAPAROTOMY 100X72X124 (DRAPES) ×3 IMPLANT
DRAPE MICROSCOPE LEICA (MISCELLANEOUS) ×3 IMPLANT
DRAPE SURG 17X23 STRL (DRAPES) ×12 IMPLANT
DRSG OPSITE POSTOP 4X6 (GAUZE/BANDAGES/DRESSINGS) ×2 IMPLANT
ELECT REM PT RETURN 9FT ADLT (ELECTROSURGICAL) ×3
ELECTRODE REM PT RTRN 9FT ADLT (ELECTROSURGICAL) ×1 IMPLANT
GAUZE 4X4 16PLY ~~LOC~~+RFID DBL (SPONGE) ×2 IMPLANT
GAUZE SPONGE 4X4 12PLY STRL (GAUZE/BANDAGES/DRESSINGS) ×3 IMPLANT
GLOVE SURG ENC MOIS LTX SZ6.5 (GLOVE) ×3 IMPLANT
GLOVE SURG LTX SZ9 (GLOVE) ×3 IMPLANT
GLOVE SURG UNDER POLY LF SZ6.5 (GLOVE) ×3 IMPLANT
GOWN STRL REUS W/ TWL LRG LVL3 (GOWN DISPOSABLE) IMPLANT
GOWN STRL REUS W/ TWL XL LVL3 (GOWN DISPOSABLE) ×1 IMPLANT
GOWN STRL REUS W/TWL 2XL LVL3 (GOWN DISPOSABLE) IMPLANT
GOWN STRL REUS W/TWL LRG LVL3 (GOWN DISPOSABLE) ×6
GOWN STRL REUS W/TWL XL LVL3 (GOWN DISPOSABLE) ×6
KIT BASIN OR (CUSTOM PROCEDURE TRAY) ×3 IMPLANT
KIT TURNOVER KIT B (KITS) ×3 IMPLANT
NDL SPNL 22GX3.5 QUINCKE BK (NEEDLE) ×1 IMPLANT
NEEDLE HYPO 22GX1.5 SAFETY (NEEDLE) ×3 IMPLANT
NEEDLE SPNL 22GX3.5 QUINCKE BK (NEEDLE) ×3 IMPLANT
NS IRRIG 1000ML POUR BTL (IV SOLUTION) ×3 IMPLANT
OIL CARTRIDGE MAESTRO DRILL (MISCELLANEOUS) ×3
PACK LAMINECTOMY NEURO (CUSTOM PROCEDURE TRAY) ×3 IMPLANT
SPONGE SURGIFOAM ABS GEL SZ50 (HEMOSTASIS) ×2 IMPLANT
STRIP CLOSURE SKIN 1/2X4 (GAUZE/BANDAGES/DRESSINGS) ×2 IMPLANT
SUT VIC AB 2-0 CT1 18 (SUTURE) ×3 IMPLANT
SUT VIC AB 3-0 SH 8-18 (SUTURE) ×3 IMPLANT
TOWEL GREEN STERILE (TOWEL DISPOSABLE) ×3 IMPLANT
TOWEL GREEN STERILE FF (TOWEL DISPOSABLE) ×3 IMPLANT
WATER STERILE IRR 1000ML POUR (IV SOLUTION) ×3 IMPLANT

## 2020-09-13 NOTE — Brief Op Note (Signed)
09/13/2020  10:23 AM  PATIENT:  Sandra Gray  50 y.o. female  PRE-OPERATIVE DIAGNOSIS:  HNP  POST-OPERATIVE DIAGNOSIS:  HNP  PROCEDURE:  Procedure(s): Left Thoracic Eight-Nine, Thoracic Ten-Eleven Microdiscectomy (Left)  SURGEON:  Surgeon(s) and Role:    Earnie Larsson, MD - Primary  PHYSICIAN ASSISTANT:   ASSISTANTSMearl Latin   ANESTHESIA:   general  EBL:  350 mL   BLOOD ADMINISTERED:none  DRAINS: none   LOCAL MEDICATIONS USED:  MARCAINE     SPECIMEN:  No Specimen  DISPOSITION OF SPECIMEN:  N/A  COUNTS:  YES  TOURNIQUET:  * No tourniquets in log *  DICTATION: .Dragon Dictation  PLAN OF CARE: Admit for overnight observation  PATIENT DISPOSITION:  PACU - hemodynamically stable.   Delay start of Pharmacological VTE agent (>24hrs) due to surgical blood loss or risk of bleeding: yes
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
## 2020-09-13 NOTE — Anesthesia Procedure Notes (Signed)
Procedure Name: Intubation Date/Time: 09/13/2020 8:12 AM Performed by: Inda Coke, CRNA Pre-anesthesia Checklist: Patient identified, Emergency Drugs available, Suction available and Patient being monitored Patient Re-evaluated:Patient Re-evaluated prior to induction Oxygen Delivery Method: Circle System Utilized Preoxygenation: Pre-oxygenation with 100% oxygen Induction Type: IV induction Ventilation: Mask ventilation without difficulty Laryngoscope Size: Mac and 3 Grade View: Grade II Tube type: Oral Tube size: 7.0 mm Number of attempts: 1 Airway Equipment and Method: Stylet Placement Confirmation: ETT inserted through vocal cords under direct vision, positive ETCO2 and breath sounds checked- equal and bilateral Secured at: 22 cm Tube secured with: Tape Dental Injury: Teeth and Oropharynx as per pre-operative assessment

## 2020-09-13 NOTE — Op Note (Signed)
Date of procedure: 09/13/2020  Date of dictation: Same  Service: Neurosurgery  Preoperative diagnosis: Left T8-9 and left T10-11 herniated nucleus pulposus with myelopathy  Postoperative diagnosis: Same  Procedure Name: Left T8-9 laminotomy and transpedicular microdiscectomy, microdissection    Left T10-T11 laminotomy and transpedicular microdiscectomy, microdissection  Surgeon:Nashea Chumney A.Leandrea Ackley, M.D.  Asst. Surgeon: Reinaldo Meeker, NP  Anesthesia: General  Indication: 50 year old female with severe left lower extremity pain paresthesias and weakness.  Work-up demonstrates evidence of a partially calcified left-sided T8-9 disc herniation with severe cord compression with signal change.  Patient also with a leftward T10-11 soft disc herniation with cord compression and exiting left T10 nerve root compression.  Patient presents now for two-level microdiscectomy in hopes of improving her symptoms.  Operative note: After induction of anesthesia, patient position prone onto bolsters and appropriately padded.  Patient's thoracic region prepped and draped sterilely.  Incision made overlying T10.  Dissection performed on the left.  Retractor placed.  X-ray taken and the T10 pedicle was identified.  Dissection of the T89 space and T10-11 space was then performed.  Starting first at T10-T11 on the left side laminotomy was performed using high-speed drill and Kerrison rongeurs.  Ligament flavum elevated and resected.  A substantial facetectomy was performed on the left side.  The microscope was brought in field used microdissection spinal canal.  The superior medial aspect of the pedicle of T10 was then removed using high-speed drill down to level of the vertebral body.  The disc space was identified.  The spaces incised.  Soft disc material was dissected free and removed using pituitary rongeurs.  Disc base was entered and cleaned of all loose or obviously degenerative disc material.  Elements of the disc herniation  were resected.  There was no evidence of injury to the thecal sac or nerve roots.  Gelfoam was placed topically for hemostasis.  Wound was then irrigated and then attention was then placed to the T8-9 level.  The retractor was repositioned at T8-9.  Laminotomy and transpedicular approach was then performed in a similar fashion.  Microscope brought in field used for microdissection spinal canal.  Here the disc herniation was more calcified.  The disc space was entered through the superior aspect of the pedicle of T9.  This gave a ledge that could then be used to deliver the disc with downward pressure.  The disc and disc herniation was then dissected free from the lateral thecal sac and pushed into the interspace were then removed using pituitary micro rongeurs.  All elements the disc herniation were resected.  There was no evidence of any injury to the thecal sac and nerve roots.  There is no evidence of any residual compression upon the thecal sac or nerve roots.  The wound was then irrigated.  Gelfoam was placed topically.  Wounds were then closed in layers of Vicryl sutures.  Steri-Strips and sterile dressing were applied.  No apparent complications.  Patient tolerated the procedure well and she returns to the recovery room postop.

## 2020-09-13 NOTE — Transfer of Care (Signed)
Immediate Anesthesia Transfer of Care Note  Patient: Sandra Gray  Procedure(s) Performed: Left Thoracic Eight-Nine, Thoracic Ten-Eleven Microdiscectomy (Left: Spine Thoracic)  Patient Location: PACU  Anesthesia Type:General  Level of Consciousness: awake and drowsy  Airway & Oxygen Therapy: Patient Spontanous Breathing and Patient connected to face mask oxygen  Post-op Assessment: Report given to RN, Post -op Vital signs reviewed and stable and Patient moving all extremities X 4  Post vital signs: Reviewed and stable  Last Vitals:  Vitals Value Taken Time  BP 132/88 09/13/20 1035  Temp 36.3 C 09/13/20 1035  Pulse 83 09/13/20 1041  Resp 16 09/13/20 1041  SpO2 100 % 09/13/20 1041  Vitals shown include unvalidated device data.  Last Pain:  Vitals:   09/13/20 1035  TempSrc:   PainSc: Asleep      Patients Stated Pain Goal: 5 ([id number])  Complications: No notable events documented.

## 2020-09-13 NOTE — Anesthesia Postprocedure Evaluation (Signed)
Anesthesia Post Note  Patient: Sandra Gray  Procedure(s) Performed: Left Thoracic Eight-Nine, Thoracic Ten-Eleven Microdiscectomy (Left: Spine Thoracic)     Patient location during evaluation: PACU Anesthesia Type: General Level of consciousness: awake and alert Pain management: pain level controlled Vital Signs Assessment: post-procedure vital signs reviewed and stable Respiratory status: spontaneous breathing, nonlabored ventilation, respiratory function stable and patient connected to nasal cannula oxygen Cardiovascular status: blood pressure returned to baseline and stable Postop Assessment: no apparent nausea or vomiting Anesthetic complications: no   No notable events documented.  Last Vitals:  Vitals:   09/13/20 1120 09/13/20 1135  BP: 132/82 137/87  Pulse: 77 80  Resp: 15 17  Temp:    SpO2: 100% 100%    Last Pain:  Vitals:   09/13/20 1035  TempSrc:   PainSc: Asleep                 Josephanthony Tindel

## 2020-09-14 ENCOUNTER — Encounter (HOSPITAL_COMMUNITY): Payer: Self-pay | Admitting: Neurosurgery

## 2020-09-14 DIAGNOSIS — M5104 Intervertebral disc disorders with myelopathy, thoracic region: Secondary | ICD-10-CM | POA: Diagnosis not present

## 2020-09-14 LAB — GLUCOSE, CAPILLARY: Glucose-Capillary: 107 mg/dL — ABNORMAL HIGH (ref 70–99)

## 2020-09-14 MED ORDER — HYDROCODONE-ACETAMINOPHEN 5-325 MG PO TABS: 1.0000 | ORAL_TABLET | ORAL | 0 refills | Status: AC | PRN

## 2020-09-14 MED ORDER — CYCLOBENZAPRINE HCL 10 MG PO TABS: 10.0000 mg | ORAL_TABLET | Freq: Three times a day (TID) | ORAL | 0 refills | Status: AC | PRN
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
## 2020-09-14 NOTE — Discharge Instructions (Signed)

## 2020-09-14 NOTE — Discharge Summary (Signed)
Physician Discharge Summary  Patient ID: Sandra Gray MRN: [id number] DOB/AGE: [birth year] 50 y.o.  Admit date: 09/13/2020 Discharge date: 09/14/2020  Admission Diagnoses:  Discharge Diagnoses:  Active Problems:   Herniation of intervertebral disc of thoracic spine with myelopathy   Discharged Condition: good  Hospital Course: Patient admitted to the hospital where she underwent uncomplicated left-sided thoracic microdiscectomies.  Postoperatively doing well.  Preoperative left lower extremity numbness and weakness much improved.  Standing and ambulating well.  Voiding well.  Ready for discharge home.  Consults:   Significant Diagnostic Studies:   Treatments:  Discharge Exam: Blood pressure 108/66, pulse 94, temperature 98 F (36.7 C), temperature source Oral, resp. rate 16, height '5\' 2"'$  (1.575 m), weight 101.6 kg, last menstrual period 08/02/2014, SpO2 100 %. Awake and alert.  Oriented and appropriate.  Motor and sensory function intact.  Wound clean and dry.  Chest and abdomen benign.  Disposition: Discharge disposition: 01-Home or Self Care        Allergies as of 09/14/2020       Reactions   Cymbalta [duloxetine Hcl] Other (See Comments)   Nausea & headache    Gadolinium Derivatives Nausea Only, Other (See Comments)   Dry heaves        Medication List     STOP taking these medications    lidocaine 5 % Commonly known as: Lidoderm       TAKE these medications    acetaminophen 500 MG tablet Commonly known as: TYLENOL Take 1,000 mg by mouth every 6 (six) hours as needed for moderate pain or mild pain.   albuterol (2.5 MG/3ML) 0.083% nebulizer solution Commonly known as: PROVENTIL Take 3 mLs (2.5 mg total) by nebulization every 6 (six) hours as needed for wheezing or shortness of breath. What changed: reasons to take this   albuterol 108 (90 Base) MCG/ACT inhaler Commonly known as: VENTOLIN HFA Inhale 2 puffs into the lungs every 6 (six) hours as  needed for wheezing. What changed: Another medication with the same name was changed. Make sure you understand how and when to take each.   chlorhexidine 0.12 % solution Commonly known as: PERIDEX 15 mLs by Mouth Rinse route 2 (two) times daily.   cyclobenzaprine 10 MG tablet Commonly known as: FLEXERIL Take 1 tablet (10 mg total) by mouth 3 (three) times daily as needed for muscle spasms.   HYDROcodone-acetaminophen 5-325 MG tablet Commonly known as: NORCO/VICODIN Take 1-2 tablets by mouth every 4 (four) hours as needed for moderate pain ((score 4 to 6)).   traZODone 50 MG tablet Commonly known as: DESYREL Take 50 mg by mouth at bedtime.       ASK your doctor about these medications    acetaminophen 650 MG CR tablet Commonly known as: Tylenol 8 Hour Take 1 tablet (650 mg total) by mouth every 8 (eight) hours as needed for pain or fever.   amLODipine 5 MG tablet Commonly known as: NORVASC Take 1 tablet by mouth once daily   Cholecalciferol 10 MCG (400 UNIT) Caps Take 2 capsules (800 Units total) by mouth daily.   cyclobenzaprine 5 MG tablet Commonly known as: FLEXERIL Take 1 tablet (5 mg total) by mouth at bedtime as needed for muscle spasms.   fluticasone 110 MCG/ACT inhaler Commonly known as: FLOVENT HFA Inhale 1 puff into the lungs 2 (two) times daily.   gabapentin 100 MG capsule Commonly known as: NEURONTIN TAKE 3 CAPSULES BY MOUTH AT BEDTIME   lisinopril 5 MG tablet Commonly known as:  ZESTRIL Take 1 tablet by mouth once daily   metFORMIN 500 MG 24 hr tablet Commonly known as: Glucophage XR Take 4 tablets (2,000 mg total) by mouth every evening.   naproxen 375 MG tablet Commonly known as: NAPROSYN Take 1 tablet (375 mg total) by mouth 2 (two) times daily.   rosuvastatin 20 MG tablet Commonly known as: Crestor Take 1 tablet (20 mg total) by mouth daily.        Follow-up Information     Earnie Larsson, MD Follow up.   Specialty:  Neurosurgery Contact information: [address] [place] [address] [place] [address] [phone]                 Signed: Charlie Pitter 09/14/2020, 8:22 AM

## 2020-09-14 NOTE — Addendum Note (Signed)
Addendum  created 09/14/20 1427 by Catalina Gravel, MD   Attestation recorded in [place], [place] filed
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
## 2020-09-14 NOTE — Progress Notes (Signed)
Patient was transported via wheelchair by volunteer for discharge home; in no acute distress nor complaints of pain nor discomfort; room was checked and accounted for all her belongings; discharge instructions given to patient by RN and patient verbalized understanding on the instructions given.

## 2020-11-04 ENCOUNTER — Encounter (HOSPITAL_COMMUNITY): Payer: Self-pay

## 2020-11-04 ENCOUNTER — Ambulatory Visit (HOSPITAL_COMMUNITY): Payer: Medicaid Other | Attending: Neurosurgery

## 2020-11-04 ENCOUNTER — Other Ambulatory Visit: Payer: Self-pay

## 2020-11-04 DIAGNOSIS — R262 Difficulty in walking, not elsewhere classified: Secondary | ICD-10-CM | POA: Diagnosis present

## 2020-11-04 DIAGNOSIS — R2681 Unsteadiness on feet: Secondary | ICD-10-CM | POA: Diagnosis present

## 2020-11-04 DIAGNOSIS — M545 Low back pain, unspecified: Secondary | ICD-10-CM | POA: Insufficient documentation

## 2020-11-04 DIAGNOSIS — M6281 Muscle weakness (generalized): Secondary | ICD-10-CM | POA: Insufficient documentation
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
## 2020-11-07 ENCOUNTER — Encounter (HOSPITAL_COMMUNITY): Payer: Self-pay

## 2020-11-07 ENCOUNTER — Ambulatory Visit (HOSPITAL_COMMUNITY): Payer: Medicaid Other

## 2020-11-07 ENCOUNTER — Other Ambulatory Visit: Payer: Self-pay

## 2020-11-07 DIAGNOSIS — M545 Low back pain, unspecified: Secondary | ICD-10-CM | POA: Diagnosis not present

## 2020-11-07 DIAGNOSIS — M6281 Muscle weakness (generalized): Secondary | ICD-10-CM

## 2020-11-07 DIAGNOSIS — R2681 Unsteadiness on feet: Secondary | ICD-10-CM

## 2020-11-07 DIAGNOSIS — R262 Difficulty in walking, not elsewhere classified: Secondary | ICD-10-CM

## 2020-11-07 NOTE — Patient Instructions (Signed)
KNEE: Extension, Long Arc Quad (Weight)    Raise leg until knee is straight. Hold _2-3__ seconds.  ___ reps per set, ___ sets per day, ___ days per week  Copyright  VHI. All rights reserved.   Toe / Heel Raise (Sitting)    Sitting, raise heels, then rock back on heels and raise toes. Repeat _5-10_ times.  Copyright  VHI. All rights reserved.  Toe / Heel Raise (Standing)    Standing with support, raise heels, then rock back on heels and raise toes. Repeat _5-10_ times.  Copyright  VHI. All rights reserved.    Marching In-Place    Standing straight, alternate bringing knees toward trunk. Arms swing alternately. 5 times.  Do _2__ sets. Do _1_ times per day.  Copyright  VHI. All rights reserved.      Sit-to-Stand Exercise  Try to build reps and sets. Start with 5 reps and 2 sets. The sit-to-stand exercise (also known as the chair stand or chair rise exercise) strengthens your lower body and helps you maintain or improve your mobility and independence. The end goal is to do the sit-to-stand exercise without using your hands. This will be easier as you become stronger. You should always talk with your health care provider before starting any exercise program, especially if you have had recent surgery. Do the exercise exactly as told by your health care provider and adjust it as directed. It is normal to feel mild stretching, pulling, tightness, or discomfort as you do this exercise, but you should stop right away if you feel sudden pain or your pain gets worse. Do not begin doing this exercise until told by your health care provider. What the sit-to-stand exercise does The sit-to-stand exercise helps to strengthen the muscles in your thighs and the muscles in the center of your body that give you stability (core muscles). This exercise is especially helpful if: You have had knee or hip surgery. You have trouble getting up from a chair, out of a car, or off the toilet due  to muscle weakness. How to do the sit-to-stand exercise Sit toward the front edge of a sturdy chair without armrests. Your knees should be bent and your feet should be flat on the floor and shoulder-width apart and underneath your hips. Place your hands lightly on each side of the seat. Keep your back and neck as straight as possible, with your chest slightly forward. Breathe in slowly. Lean forward and slightly shift your weight to the front of your feet. Breathe out as you slowly stand up. Try not to support any weight with your hands. Stand and pause for a full breath in and out. Breathe in as you sit down slowly. Tighten your core and abdominal muscles to control your lowering as much as possible. You should lower yourself back to the chair slowly, not just drop back into the seat. Breathe out slowly. Do this exercise 10-15 times. If needed, do it fewer times until you build up strength. Rest for 1 minute, then do another set of 10-15 repetitions. To change the difficulty of the sit-to-stand exercise If the exercise is too difficult, use a chair with sturdy armrests, and push off the armrests to help you come to the standing position. You can also use the armrests to help slowly lower yourself back to sitting. As this gets easier, try to use your arms less. You can also place a firm cushion or pillow on the chair to make the surface higher. If this  exercise is too easy, do not use your arms to help raise or lower yourself. You can also wear a weighted vest, use hand weights, increase your repetitions, or try a lower chair. General tips You may feel tired when starting an exercise routine. This is normal. You may have muscle soreness that lasts a few days. This is normal. As you get stronger, you may not feel muscle soreness. Use smooth, steady movements. Do not  hold your breath during strength exercises. This can cause unsafe changes in your blood pressure. Breathe in slowly through your  nose, and breathe out slowly through your mouth. Summary Strengthening your lower body is an important step to help you move safely and independently. The sit-to-stand exercise helps strengthen the muscles in your thighs and core. You should always talk with your health care provider before starting any exercise program, especially if you have had recent surgery. This information is not intended to replace advice given to you by your health care provider. Make sure you discuss any questions you have with your health care provider. Document Revised: 05/22/2020 Document Reviewed: 05/22/2020 Elsevier Patient Education  [place].

## 2020-11-07 NOTE — Therapy (Signed)
[place], [place], [address] Phone: [phone]   Fax:  [phone]  Physical Therapy Treatment  Patient Details  Name: Sandra Gray MRN: [id number] Date of Birth: [birth year] Referring Provider (PT): Earnie Larsson, MD   Encounter Date: 11/07/2020   PT End of Session - 11/07/20 0933     Visit Number 2    Number of Visits 6    Date for PT Re-Evaluation 12/16/20    Authorization Type [place] Medicaid UHC    PT Start Time [id number]    PT Stop Time 1016    PT Time Calculation (min) 42 min    Activity Tolerance Patient limited by pain    Behavior During Therapy [facility] for tasks assessed/performed   tearful throughout            Past Medical History:  Diagnosis Date   Anemia    Anxiety    Asthma 02/12/2006   Bronchitis    Chronic kidney disease    "declining kidney, but it's stable right now"   Depression    Diabetes mellitus without complication ([place])    Fibroids    Hypertension 02/13/2011   Started on lisinopril   Pneumonia    Pt reports she had pneumonia at 50 years of age    Past Surgical History:  Procedure Laterality Date   DILATION AND CURETTAGE OF UTERUS  08/2011   HYSTEROSCOPY  02/15/2012   Fibroid Removal   HYSTEROSCOPY  may 2014   ROBOTIC ASSISTED SALPINGO OOPHERECTOMY Bilateral 07/28/2015   Procedure: XI ROBOTIC ASSISTED BILATERAL SALPINGO OOPHORECTOMY AND LYSIS OF ADHESIONS;  Surgeon: Everitt Amber, MD;  Location: [facility];  Service: Gynecology;  Laterality: Bilateral;   SUPRACERVICAL ABDOMINAL HYSTERECTOMY N/A 08/31/2014   Procedure: HYSTERECTOMY SUPRACERVICAL ABDOMINAL;  Surgeon: [name], MD;  Location: [facility];  Service: Gynecology;  Laterality: N/A;  Dr. Elonda Husky will assist   THORACIC DISCECTOMY Left 09/13/2020   Procedure: Left Thoracic Eight-Nine, Thoracic Ten-Eleven Microdiscectomy;  Surgeon: Earnie Larsson, MD;  Location: [facility];  Service: Neurosurgery;  Laterality: Left;    There were no vitals  filed for this visit.   Subjective Assessment - 11/07/20 0933     Subjective Took Tylenol and placed pain patch in low back before therapy today.    Limitations Standing;Walking;Sitting;Lifting;House hold activities    How long can you sit comfortably? "not very long"    How long can you stand comfortably? 3-5 min    How long can you walk comfortably? 70-140 ft    Patient Stated Goals "Be normal, walk without AD."    Currently in Pain? Yes    Pain Score 6     Pain Location Back    Pain Descriptors / Indicators Sharp   pulling   Pain Onset More than a month ago                [facility] PT Assessment - 11/07/20 0001       Functional Tests   Functional tests Sit to Stand      Sit to Stand   Comments 5xSTS 52 seconds   pain across low back             [facility] Adult PT Treatment/Exercise - 11/07/20 0001       Knee/Hip Exercises: Standing   Heel Raises Both;2 sets;5 reps    Hip Flexion Stengthening;Both;2 sets;5 reps    Hip Abduction Stengthening;Both;2 sets;5 reps    Hip Extension Stengthening;Both;2 sets;5 reps  Knee/Hip Exercises: Seated   Long Arc Quad Strengthening;Both;10 reps    Other Seated Knee/Hip Exercises heel/toe raise 5x 2 sets    Marching Strengthening;Both;2 sets;5 reps   heel/toe raises 5 reps 2 sets   Sit to Sand 2 sets;5 reps;with UE support              PT Education - 11/07/20 0954     Education Details Reviewed initial evaluation, goals and plan of care. Discussed purposea and technique throughout skilled interventions. Advanced HEP.    Person(s) Educated Patient    Methods Explanation;Handout    Comprehension Verbalized understanding              PT Short Term Goals - 11/04/20 1011       PT SHORT TERM GOAL #1   Title Patient will be independent with HEP in order to improve functional outcomes    Time 3    Period Weeks    Status New    Target Date 11/25/20      PT SHORT TERM GOAL #2   Title Patient will report at least 25%  improvement in symptoms for improved quality of life.    Baseline 10/10 constant back pain, able to walk 70-140 ft with 4WW at home    Time 3    Period Weeks    Status New    Target Date 11/25/20      PT SHORT TERM GOAL #3   Title Demonstrate trunk flexion WFL to improve mobility    Baseline unable to assess due to pain and fear of falling/back pain    Time 3    Period Weeks    Status New    Target Date 11/25/20               PT Long Term Goals - 11/04/20 1014       PT LONG TERM GOAL #1   Title Patient will be able to complete 5x STS in under 11.4 seconds in order to reduce the risk of falls.    Baseline unable    Time 6    Period Weeks    Status New    Target Date 12/16/20      PT LONG TERM GOAL #2   Title Patient will be able to ambulate at least 200 feet in 2MWT in order to demonstrate improved gait speed for community ambulation.    Baseline 125 ft with 4WW    Time 6    Period Weeks    Status New    Target Date 12/16/20                   Plan - 11/07/20 0933     Clinical Impression Statement Reviewed initial evaluation, goals and plan of care. Patient reported fearful of movement so session moved slow with constant monitoring of patient's response to therapeutic exercises. Added seated heel/toe raises, long arc quad, marching, and sit to stand; in standing, added heel/toe raise and marching to therapeutic exercises. Keep sets and reps low to increase patient's confidence and adjust to patient's fatigue and weakness level. Added all new exercises to HEP with positive patient response. Educated patient on DOMS. Patient responded well to treatment. Patient will continue to benefit from skilled therapeutic interventions to reduce impairment and improve function.    Personal Factors and Comorbidities Behavior Pattern;Time since onset of injury/illness/exacerbation;Past/Current Experience    Examination-Activity Limitations Bed  Mobility;Bend;Carry;Lift;Stand;Stairs;Squat;Reach Overhead;Locomotion Level;Transfers    Examination-Participation Restrictions Church;Cleaning;Community Activity;Laundry;Yard Work;Shop;Occupation;Meal Prep  Stability/Clinical Decision Making Stable/Uncomplicated    Rehab Potential Fair    PT Frequency 1x / week    PT Duration 6 weeks    PT Treatment/Interventions ADLs/Self Care Home Management;Electrical Stimulation;DME Instruction;Ultrasound;Traction;Gait training;Stair training;Functional mobility training;Therapeutic activities;Therapeutic exercise;Balance training;Patient/family education;Neuromuscular re-education;Manual techniques;Dry needling;Spinal Manipulations;Joint Manipulations    PT Next Visit Plan continue with general ther ex for LE strength, trunk ROM/strength as tolerated    PT Home Exercise Plan standing hip abduction, extension with countertop support; 9/26 seated heel/toe raise, STS, march, LAQ, standing heel/toe raise, march    Consulted and Agree with Plan of Care Patient             Patient will benefit from skilled therapeutic intervention in order to improve the following deficits and impairments:  Decreased activity tolerance, Decreased balance, Decreased mobility, Decreased range of motion, Decreased strength, Difficulty walking, Impaired perceived functional ability, Postural dysfunction, Improper body mechanics, Pain  Visit Diagnosis: Acute low back pain, unspecified back pain laterality, unspecified whether sciatica present  Muscle weakness (generalized)  Difficulty in walking, not elsewhere classified  Unsteadiness on feet     Problem List Patient Active Problem List   Diagnosis Date Noted   Herniation of intervertebral disc of thoracic spine with myelopathy 09/13/2020   Mild intermittent asthma without complication 54/49/2010   Right thigh pain 03/31/2018   Leukocytosis 02/07/2018   Chronic pain of both knees 01/31/2018   Pain in both lower  extremities 01/13/2018   Tachycardia 01/13/2018   Chronic right shoulder pain 10/18/2017   Right hand pain 10/18/2017   Hyperlipidemia 10/11/2017   Type 2 diabetes mellitus with complication, without long-term current use of insulin ([place]) 10/11/2017   Rash 10/25/2016   Hormone replacement therapy (HRT) 09/24/2016   Screening for breast cancer 04/25/2016   Chronic low back pain 04/25/2016   Pain of right thumb 12/16/2015   Cough 12/16/2015   Endometriosis of rectovaginal septum 06/14/2015   Endometriosis 02/16/2015   Endometriosis of pelvis 10/12/2014   Uterine fibroid 12/28/2013   Obesity (BMI 30-39.9) 11/30/2013   Hypertension 03/26/2012   Anxiety and depression 03/26/2012   Routine general medical examination at a health care facility 03/26/2012   Pamala Hurry D. Hartnett-Rands, MS, PT [facility] [place] [phone]  Jeannie Done, PT 11/07/2020, 1:00 PM  [place] [address] [place], [place], [address] Phone: [phone]   Fax:  [phone]  Name: Sandra Gray MRN: [id number] Date of Birth: April 26, 1970

## 2020-11-15 ENCOUNTER — Ambulatory Visit (HOSPITAL_COMMUNITY): Payer: Medicaid Other | Attending: Neurosurgery

## 2020-11-15 ENCOUNTER — Other Ambulatory Visit: Payer: Self-pay

## 2020-11-15 DIAGNOSIS — R2681 Unsteadiness on feet: Secondary | ICD-10-CM | POA: Insufficient documentation

## 2020-11-15 DIAGNOSIS — M6281 Muscle weakness (generalized): Secondary | ICD-10-CM | POA: Insufficient documentation

## 2020-11-15 DIAGNOSIS — M545 Low back pain, unspecified: Secondary | ICD-10-CM | POA: Diagnosis not present

## 2020-11-15 DIAGNOSIS — R262 Difficulty in walking, not elsewhere classified: Secondary | ICD-10-CM | POA: Insufficient documentation

## 2020-11-15 NOTE — Therapy (Signed)
[place], [place], [address] Phone: [phone]   Fax:  [phone]  Physical Therapy Treatment  Patient Details  Name: Sandra Gray MRN: [id number] Date of Birth: [birth year] Referring Provider (PT): Earnie Larsson, MD   Encounter Date: 11/15/2020   PT End of Session - 11/15/20 0824     Visit Number 3    Number of Visits 6    Date for PT Re-Evaluation 12/16/20    Authorization Type [place] Medicaid UHC    PT Start Time 0815    PT Stop Time 0900    PT Time Calculation (min) 45 min    Activity Tolerance Patient limited by pain    Behavior During Therapy [facility] for tasks assessed/performed   tearful throughout            Past Medical History:  Diagnosis Date   Anemia    Anxiety    Asthma 02/12/2006   Bronchitis    Chronic kidney disease    "declining kidney, but it's stable right now"   Depression    Diabetes mellitus without complication ([place])    Fibroids    Hypertension 02/13/2011   Started on lisinopril   Pneumonia    Pt reports she had pneumonia at 50 years of age    Past Surgical History:  Procedure Laterality Date   DILATION AND CURETTAGE OF UTERUS  08/2011   HYSTEROSCOPY  02/15/2012   Fibroid Removal   HYSTEROSCOPY  may 2014   ROBOTIC ASSISTED SALPINGO OOPHERECTOMY Bilateral 07/28/2015   Procedure: XI ROBOTIC ASSISTED BILATERAL SALPINGO OOPHORECTOMY AND LYSIS OF ADHESIONS;  Surgeon: Everitt Amber, MD;  [facility];  Service: Gynecology;  Laterality: Bilateral;   SUPRACERVICAL ABDOMINAL HYSTERECTOMY N/A 08/31/2014   Procedure: HYSTERECTOMY SUPRACERVICAL ABDOMINAL;  Surgeon: [name], MD;  [facility];  Service: Gynecology;  Laterality: N/A;  Dr. Elonda Husky will assist   THORACIC DISCECTOMY Left 09/13/2020   Procedure: Left Thoracic Eight-Nine, Thoracic Ten-Eleven Microdiscectomy;  Surgeon: Earnie Larsson, MD;  Location: [facility];  Service: Neurosurgery;  Laterality: Left;    There were no vitals  filed for this visit.   Subjective Assessment - 11/15/20 0823     Subjective No new issues, continues to experience LBP and upper left back pain at surgical site.    Limitations Standing;Walking;Sitting;Lifting;House hold activities    How long can you sit comfortably? "not very long"    How long can you stand comfortably? 3-5 min    How long can you walk comfortably? 70-140 ft    Patient Stated Goals "Be normal, walk without AD."    Currently in Pain? Yes    Pain Score 4     Pain Location Back    Pain Orientation Left;Mid;Lower    Pain Type Acute pain;Surgical pain    Pain Onset More than a month ago                [facility] PT Assessment - 11/15/20 0001       Assessment   Medical Diagnosis herniated nucleus pulposus w/myelopathy, thoracic    Referring Provider (PT) Earnie Larsson, MD    Onset Date/Surgical Date 09/13/20    Next MD Visit 11/30/20                           [facility] Adult PT Treatment/Exercise - 11/15/20 0001       Knee/Hip Exercises: Aerobic   Nustep level 1 x  5 min for dynamic warm-up      Knee/Hip Exercises: Standing   Heel Raises Both;2 sets;10 reps    Knee Flexion Strengthening;Both;2 sets;10 reps    Knee Flexion Limitations 2#    Hip Flexion Stengthening;Both;2 sets;10 reps    Hip Flexion Limitations 2#    Hip Abduction Stengthening;Both;2 sets;10 reps    Abduction Limitations 2#    Hip Extension Stengthening;Both;2 sets;10 reps    Extension Limitations 2#      Knee/Hip Exercises: Seated   Long Arc Quad Strengthening;Both;2 sets;10 reps    Long Arc Quad Weight 2 lbs.    Ball Squeeze 2x10    Sit to [employer] 2 sets;5 reps   holding 1 kg ball for sit-stand with press-out in standing                      PT Short Term Goals - 11/04/20 1011       PT SHORT TERM GOAL #1   Title Patient will be independent with HEP in order to improve functional outcomes    Time 3    Period Weeks    Status New    Target Date 11/25/20       PT SHORT TERM GOAL #2   Title Patient will report at least 25% improvement in symptoms for improved quality of life.    Baseline 10/10 constant back pain, able to walk 70-140 ft with 4WW at home    Time 3    Period Weeks    Status New    Target Date 11/25/20      PT SHORT TERM GOAL #3   Title Demonstrate trunk flexion WFL to improve mobility    Baseline unable to assess due to pain and fear of falling/back pain    Time 3    Period Weeks    Status New    Target Date 11/25/20               PT Long Term Goals - 11/04/20 1014       PT LONG TERM GOAL #1   Title Patient will be able to complete 5x STS in under 11.4 seconds in order to reduce the risk of falls.    Baseline unable    Time 6    Period Weeks    Status New    Target Date 12/16/20      PT LONG TERM GOAL #2   Title Patient will be able to ambulate at least 200 feet in 2MWT in order to demonstrate improved gait speed for community ambulation.    Baseline 125 ft with 4WW    Time 6    Period Weeks    Status New    Target Date 12/16/20                   Plan - 11/15/20 0848     Clinical Impression Statement Maintains very slow and guarded movements with high degree of fear and anxiety with active movements.  Despite these issues pt able to progress with functional strengthening with more emphasis on LE due to fear of spinal movements despite education/instruction in proper body mechanics.  Some instances of left knee buckling when walking observed but maintained with adequate control and no LOB experienced. Continues to ambulate with heavy reliance on rollator and UE support. Continued sessions indicated to improve LE strength, balance, core strength to improve activity tolerance.    Personal Factors and Comorbidities Behavior Pattern;Time since onset  of injury/illness/exacerbation;Past/Current Experience    Examination-Activity Limitations Bed Mobility;Bend;Carry;Lift;Stand;Stairs;Squat;Reach  Overhead;Locomotion Level;Transfers    Examination-Participation Restrictions Church;Cleaning;Community Activity;Laundry;Yard Work;Shop;Occupation;Meal Prep    Stability/Clinical Decision Making Stable/Uncomplicated    Rehab Potential Fair    PT Frequency 1x / week    PT Duration 6 weeks    PT Treatment/Interventions ADLs/Self Care Home Management;Electrical Stimulation;DME Instruction;Ultrasound;Traction;Gait training;Stair training;Functional mobility training;Therapeutic activities;Therapeutic exercise;Balance training;Patient/family education;Neuromuscular re-education;Manual techniques;Dry needling;Spinal Manipulations;Joint Manipulations    PT Next Visit Plan continue with general ther ex for LE strength, trunk ROM/strength as tolerated    PT Home Exercise Plan standing hip abduction, extension with countertop support; 9/26 seated heel/toe raise, STS, march, LAQ, standing heel/toe raise, march    Consulted and Agree with Plan of Care Patient             Patient will benefit from skilled therapeutic intervention in order to improve the following deficits and impairments:  Decreased activity tolerance, Decreased balance, Decreased mobility, Decreased range of motion, Decreased strength, Difficulty walking, Impaired perceived functional ability, Postural dysfunction, Improper body mechanics, Pain  Visit Diagnosis: Acute low back pain, unspecified back pain laterality, unspecified whether sciatica present  Muscle weakness (generalized)  Difficulty in walking, not elsewhere classified  Unsteadiness on feet     Problem List Patient Active Problem List   Diagnosis Date Noted   Herniation of intervertebral disc of thoracic spine with myelopathy 09/13/2020   Mild intermittent asthma without complication 53/61/4431   Right thigh pain 03/31/2018   Leukocytosis 02/07/2018   Chronic pain of both knees 01/31/2018   Pain in both lower extremities 01/13/2018   Tachycardia 01/13/2018    Chronic right shoulder pain 10/18/2017   Right hand pain 10/18/2017   Hyperlipidemia 10/11/2017   Type 2 diabetes mellitus with complication, without long-term current use of insulin ([place]) 10/11/2017   Rash 10/25/2016   Hormone replacement therapy (HRT) 09/24/2016   Screening for breast cancer 04/25/2016   Chronic low back pain 04/25/2016   Pain of right thumb 12/16/2015   Cough 12/16/2015   Endometriosis of rectovaginal septum 06/14/2015   Endometriosis 02/16/2015   Endometriosis of pelvis 10/12/2014   Uterine fibroid 12/28/2013   Obesity (BMI 30-39.9) 11/30/2013   Hypertension 03/26/2012   Anxiety and depression 03/26/2012   Routine general medical examination at a health care facility 03/26/2012   8:59 AM, 11/15/20 M. Sherlyn Lees, PT, DPT Physical Therapist- [place] Office Number: [phone]   [facility] [address] [place], [place], [address] Phone: [phone]   Fax:  [phone]  Name: Sandra Gray MRN: [id number] Date of Birth: 1970-10-22

## 2020-11-24 ENCOUNTER — Other Ambulatory Visit: Payer: Self-pay

## 2020-11-24 ENCOUNTER — Ambulatory Visit (HOSPITAL_COMMUNITY): Payer: Medicaid Other

## 2020-11-24 ENCOUNTER — Encounter (HOSPITAL_COMMUNITY): Payer: Self-pay

## 2020-11-24 DIAGNOSIS — M545 Low back pain, unspecified: Secondary | ICD-10-CM | POA: Diagnosis not present

## 2020-11-24 DIAGNOSIS — R2681 Unsteadiness on feet: Secondary | ICD-10-CM

## 2020-11-24 DIAGNOSIS — M6281 Muscle weakness (generalized): Secondary | ICD-10-CM

## 2020-11-24 DIAGNOSIS — R262 Difficulty in walking, not elsewhere classified: Secondary | ICD-10-CM

## 2020-11-24 NOTE — Patient Instructions (Addendum)
ABDUCTION: Sitting - Resistance Band (Active)    Sit with feet flat. Towel roll between knees, then knees out against yellow resistance band, draw knee out to side. Complete _1-2__ sets of _5/10__ repetitions. Perform __1_ sessions per day.  Copyright  VHI. All rights reserved.

## 2020-11-24 NOTE — Therapy (Signed)
[facility] [place], [place], [address] Phone: [phone]   Fax:  [phone]  Physical Therapy Treatment  Patient Details  Name: Sandra Gray MRN: [id number] Date of Birth: 1970/11/22 Referring Provider (PT): Earnie Larsson, MD   Encounter Date: 11/24/2020   PT End of Session - 11/24/20 0948     Visit Number 4    Number of Visits 6    Date for PT Re-Evaluation 12/16/20    Authorization Type [place] Medicaid UHC    PT Start Time [phone]    PT Stop Time 7106    PT Time Calculation (min) 40 min    Activity Tolerance Patient limited by pain    Behavior During Therapy [facility] for tasks assessed/performed   tearful throughout            Past Medical History:  Diagnosis Date   Anemia    Anxiety    Asthma 02/12/2006   Bronchitis    Chronic kidney disease    "declining kidney, but it's stable right now"   Depression    Diabetes mellitus without complication ([place])    Fibroids    Hypertension 02/13/2011   Started on lisinopril   Pneumonia    Pt reports she had pneumonia at 50 years of age    Past Surgical History:  Procedure Laterality Date   DILATION AND CURETTAGE OF UTERUS  08/2011   HYSTEROSCOPY  02/15/2012   Fibroid Removal   HYSTEROSCOPY  may 2014   ROBOTIC ASSISTED SALPINGO OOPHERECTOMY Bilateral 07/28/2015   Procedure: XI ROBOTIC ASSISTED BILATERAL SALPINGO OOPHORECTOMY AND LYSIS OF ADHESIONS;  Surgeon: Everitt Amber, MD;  Location: [facility];  Service: Gynecology;  Laterality: Bilateral;   SUPRACERVICAL ABDOMINAL HYSTERECTOMY N/A 08/31/2014   Procedure: HYSTERECTOMY SUPRACERVICAL ABDOMINAL;  Surgeon: [name], MD;  Location: [facility];  Service: Gynecology;  Laterality: N/A;  Dr. Elonda Husky will assist   THORACIC DISCECTOMY Left 09/13/2020   Procedure: Left Thoracic Eight-Nine, Thoracic Ten-Eleven Microdiscectomy;  Surgeon: Earnie Larsson, MD;  Location: [facility];  Service: Neurosurgery;  Laterality: Left;    There were no  vitals filed for this visit.   Subjective Assessment - 11/24/20 0946     Subjective Feels she is pushing herself more. Feels she is bit more determined to get better. Has on a pain patch and took pain medicine prior to session.    Limitations Standing;Walking;Sitting;Lifting;House hold activities    How long can you sit comfortably? "not very long"    How long can you stand comfortably? 3-5 min    How long can you walk comfortably? 70-140 ft    Patient Stated Goals "Be normal, walk without AD."    Currently in Pain? Yes    Pain Score 5     Pain Orientation Posterior;Left    Pain Onset More than a month ago               [facility] Adult PT Treatment/Exercise - 11/24/20 0001       Knee/Hip Exercises: Aerobic   Nustep level 1 x 7 min for dynamic warm-up   seat 7 showing     Knee/Hip Exercises: Standing   Heel Raises Both;2 sets;10 reps      Knee/Hip Exercises: Seated   Long Arc Quad Strengthening;Both;2 sets;10 reps    Long Arc Quad Weight 2 lbs.    Other Seated Knee/Hip Exercises heel/toe raises 1x10    Marching Strengthening;Both;2 sets;5 reps    Marching Limitations 2# on R  ankle set of 10; no qwt on L ankle    Abduction/Adduction  Strengthening;Both;2 sets;10 reps    Abd/Adduction Limitations Yellow theraband    Sit to Sand --               PT Education - 11/24/20 1159     Education Details HEP    Person(s) Educated Patient    Methods Explanation;Handout    Comprehension Verbalized understanding              PT Short Term Goals - 11/24/20 0949       PT SHORT TERM GOAL #1   Title Patient will be independent with HEP in order to improve functional outcomes    Time 3    Period Weeks    Status On-going    Target Date 11/25/20      PT SHORT TERM GOAL #2   Title Patient will report at least 25% improvement in symptoms for improved quality of life.    Baseline 10/10 constant back pain, able to walk 70-140 ft with 4WW at home; 10/13 - 10-15% better  reported    Time 3    Period Weeks    Status On-going    Target Date 11/25/20      PT SHORT TERM GOAL #3   Title Demonstrate trunk flexion WFL to improve mobility    Baseline unable to assess due to pain and fear of falling/back pain    Time 3    Period Weeks    Status On-going    Target Date 11/25/20               PT Long Term Goals - 11/24/20 0950       PT LONG TERM GOAL #1   Title Patient will be able to complete 5x STS in under 11.4 seconds in order to reduce the risk of falls.    Baseline unable    Time 6    Period Weeks    Status On-going      PT LONG TERM GOAL #2   Title Patient will be able to ambulate at least 200 feet in 2MWT in order to demonstrate improved gait speed for community ambulation.    Baseline 125 ft with 4WW    Time 6    Period Weeks    Status On-going                   Plan - 11/24/20 0948     Clinical Impression Statement Somewhat increased ambulation speed with rolling walker today. Patient was observed to ambulate short distance with right hand on rolling walker and left hand on left hip guarding against pain. Patient was able to complete 7 minutes on NuStep, increased from 5. Patient continues to be apprehensive with transitional movements. During session, patient requested no more standing exercises due to pain. With seated marching, patient able to perform 10 reps with 2# weight on right ankle but 0# on left ankle and 5 reps. Cued patient to engage core prior to lift and this reported decreased her pain. Added seated hip abduction with yellow theraband to therapeutic exercises and HEP. Issued yellow theraband to patient. Patient would continue to benefit from continued skilled physical therapy to reduce impairment and improve function.    Personal Factors and Comorbidities Behavior Pattern;Time since onset of injury/illness/exacerbation;Past/Current Experience    Examination-Activity Limitations Bed  Mobility;Bend;Carry;Lift;Stand;Stairs;Squat;Reach Overhead;Locomotion Level;Transfers    Examination-Participation Restrictions Church;Cleaning;Community Activity;Laundry;Yard Work;Shop;Occupation;Meal Prep    Stability/Clinical Decision Making  Stable/Uncomplicated    Rehab Potential Fair    PT Frequency 1x / week    PT Duration 6 weeks    PT Treatment/Interventions ADLs/Self Care Home Management;Electrical Stimulation;DME Instruction;Ultrasound;Traction;Gait training;Stair training;Functional mobility training;Therapeutic activities;Therapeutic exercise;Balance training;Patient/family education;Neuromuscular re-education;Manual techniques;Dry needling;Spinal Manipulations;Joint Manipulations    PT Next Visit Plan continue with general ther ex for LE strength, trunk ROM/strength as tolerated    PT Home Exercise Plan standing hip abduction, extension with countertop support; 9/26 seated heel/toe raise, STS, march, LAQ, standing heel/toe raise, march; 10/13 seated hip abd/add, YTB given    Consulted and Agree with Plan of Care Patient             Patient will benefit from skilled therapeutic intervention in order to improve the following deficits and impairments:  Decreased activity tolerance, Decreased balance, Decreased mobility, Decreased range of motion, Decreased strength, Difficulty walking, Impaired perceived functional ability, Postural dysfunction, Improper body mechanics, Pain  Visit Diagnosis: Acute low back pain, unspecified back pain laterality, unspecified whether sciatica present  Muscle weakness (generalized)  Difficulty in walking, not elsewhere classified  Unsteadiness on feet     Problem List Patient Active Problem List   Diagnosis Date Noted   Herniation of intervertebral disc of thoracic spine with myelopathy 09/13/2020   Mild intermittent asthma without complication 49/17/9150   Right thigh pain 03/31/2018   Leukocytosis 02/07/2018   Chronic pain of both  knees 01/31/2018   Pain in both lower extremities 01/13/2018   Tachycardia 01/13/2018   Chronic right shoulder pain 10/18/2017   Right hand pain 10/18/2017   Hyperlipidemia 10/11/2017   Type 2 diabetes mellitus with complication, without long-term current use of insulin ([place]) 10/11/2017   Rash 10/25/2016   Hormone replacement therapy (HRT) 09/24/2016   Screening for breast cancer 04/25/2016   Chronic low back pain 04/25/2016   Pain of right thumb 12/16/2015   Cough 12/16/2015   Endometriosis of rectovaginal septum 06/14/2015   Endometriosis 02/16/2015   Endometriosis of pelvis 10/12/2014   Uterine fibroid 12/28/2013   Obesity (BMI 30-39.9) 11/30/2013   Hypertension 03/26/2012   Anxiety and depression 03/26/2012   Routine general medical examination at a health care facility 03/26/2012   Pamala Hurry D. Hartnett-Rands, MS, PT Per [place] [phone]  Jeannie Done, PT 11/24/2020, 12:08 PM  [facility] [address] [place], [place], [address] Phone: [phone]   Fax:  [phone]  Name: Sandra Gray MRN: [id number] Date of Birth: 1970/08/12

## 2020-12-01 ENCOUNTER — Ambulatory Visit (HOSPITAL_COMMUNITY): Payer: Medicaid Other

## 2020-12-07 ENCOUNTER — Ambulatory Visit (HOSPITAL_COMMUNITY): Payer: Medicaid Other

## 2020-12-07 ENCOUNTER — Other Ambulatory Visit: Payer: Self-pay

## 2020-12-07 DIAGNOSIS — R2681 Unsteadiness on feet: Secondary | ICD-10-CM

## 2020-12-07 DIAGNOSIS — M545 Low back pain, unspecified: Secondary | ICD-10-CM | POA: Diagnosis not present

## 2020-12-07 DIAGNOSIS — M6281 Muscle weakness (generalized): Secondary | ICD-10-CM

## 2020-12-07 DIAGNOSIS — R262 Difficulty in walking, not elsewhere classified: Secondary | ICD-10-CM

## 2020-12-07 NOTE — Therapy (Signed)
[facility] [place], [place], [address] Phone: [phone]   Fax:  [phone]  Physical Therapy Treatment  Patient Details  Name: Sandra Gray MRN: [id number] Date of Birth: [birth year] Referring Provider (PT): Earnie Larsson, MD   Encounter Date: 12/07/2020   PT End of Session - 12/07/20 0954     Visit Number 5    Number of Visits 6    Date for PT Re-Evaluation 12/16/20    Authorization Type [place] Medicaid UHC    PT Start Time 0945    PT Stop Time 1030    PT Time Calculation (min) 45 min    Activity Tolerance Patient limited by pain    Behavior During Therapy [facility] for tasks assessed/performed   tearful throughout            Past Medical History:  Diagnosis Date   Anemia    Anxiety    Asthma 02/12/2006   Bronchitis    Chronic kidney disease    "declining kidney, but it's stable right now"   Depression    Diabetes mellitus without complication ([place])    Fibroids    Hypertension 02/13/2011   Started on lisinopril   Pneumonia    Pt reports she had pneumonia at 50 years of age    Past Surgical History:  Procedure Laterality Date   DILATION AND CURETTAGE OF UTERUS  08/2011   HYSTEROSCOPY  02/15/2012   Fibroid Removal   HYSTEROSCOPY  may 2014   ROBOTIC ASSISTED SALPINGO OOPHERECTOMY Bilateral 07/28/2015   Procedure: XI ROBOTIC ASSISTED BILATERAL SALPINGO OOPHORECTOMY AND LYSIS OF ADHESIONS;  Surgeon: Everitt Amber, MD;  [facility];  Service: Gynecology;  Laterality: Bilateral;   SUPRACERVICAL ABDOMINAL HYSTERECTOMY N/A 08/31/2014   Procedure: HYSTERECTOMY SUPRACERVICAL ABDOMINAL;  Surgeon: [name], MD;  Location[facility];  Service: Gynecology;  Laterality: N/A;  Dr. Elonda Husky will assist   THORACIC DISCECTOMY Left 09/13/2020   Procedure: Left Thoracic Eight-Nine, Thoracic Ten-Eleven Microdiscectomy;  Surgeon: Earnie Larsson, MD;  Location: [facility];  Service: Neurosurgery;  Laterality: Left;    There were no  vitals filed for this visit.   Subjective Assessment - 12/07/20 0951     Subjective Pt reports she had appointment with surgeon the other week.  Pt notes she experienced 2 episodes of incontinence after her appointment with surgeon, but notes no additional episodes.    Limitations Standing;Walking;Sitting;Lifting;House hold activities    How long can you sit comfortably? "not very long"    How long can you stand comfortably? 3-5 min    How long can you walk comfortably? 70-140 ft    Patient Stated Goals "Be normal, walk without AD."    Pain Onset More than a month ago                [facility] PT Assessment - 12/07/20 0001       Assessment   Next MD Visit 01/2021                           [facility] Adult PT Treatment/Exercise - 12/07/20 0001       Knee/Hip Exercises: Aerobic   Nustep level 1 x 6 min      Knee/Hip Exercises: Standing   Heel Raises Both;2 sets;10 reps    Other Standing Knee Exercises static standing on airex pad 6x10 sec . Heel riase 2x10, marche 2x10      Knee/Hip Exercises: Seated  Long Arc [employer] Strengthening;Both;2 sets;10 reps    Other Seated Knee/Hip Exercises heel/toe raises 2x10    Marching Strengthening;Both;2 sets;5 reps    Abduction/Adduction  Strengthening;Both;2 sets;10 reps    Abd/Adduction Limitations Yellow theraband                       PT Short Term Goals - 11/24/20 0949       PT SHORT TERM GOAL #1   Title Patient will be independent with HEP in order to improve functional outcomes    Time 3    Period Weeks    Status On-going    Target Date 11/25/20      PT SHORT TERM GOAL #2   Title Patient will report at least 25% improvement in symptoms for improved quality of life.    Baseline 10/10 constant back pain, able to walk 70-140 ft with 4WW at home; 10/13 - 10-15% better reported    Time 3    Period Weeks    Status On-going    Target Date 11/25/20      PT SHORT TERM GOAL #3   Title Demonstrate trunk  flexion WFL to improve mobility    Baseline unable to assess due to pain and fear of falling/back pain    Time 3    Period Weeks    Status On-going    Target Date 11/25/20               PT Long Term Goals - 11/24/20 0950       PT LONG TERM GOAL #1   Title Patient will be able to complete 5x STS in under 11.4 seconds in order to reduce the risk of falls.    Baseline unable    Time 6    Period Weeks    Status On-going      PT LONG TERM GOAL #2   Title Patient will be able to ambulate at least 200 feet in 2MWT in order to demonstrate improved gait speed for community ambulation.    Baseline 125 ft with 4WW    Time 6    Period Weeks    Status On-going                   Plan - 12/07/20 1021     Clinical Impression Statement Pt progressing with POC details and reports she and MD would like her to continue therapy for another 6 weeks to progress strength and mobility. Will recert at next session to address STG/LTG. Demo good HEP recall with only occasional cue for sequence and progression    Personal Factors and Comorbidities Behavior Pattern;Time since onset of injury/illness/exacerbation;Past/Current Experience    Examination-Activity Limitations Bed Mobility;Bend;Carry;Lift;Stand;Stairs;Squat;Reach Overhead;Locomotion Level;Transfers    Examination-Participation Restrictions Church;Cleaning;Community Activity;Laundry;Yard Work;Shop;Occupation;Meal Prep    Stability/Clinical Decision Making Stable/Uncomplicated    Rehab Potential Fair    PT Frequency 1x / week    PT Duration 6 weeks    PT Treatment/Interventions ADLs/Self Care Home Management;Electrical Stimulation;DME Instruction;Ultrasound;Traction;Gait training;Stair training;Functional mobility training;Therapeutic activities;Therapeutic exercise;Balance training;Patient/family education;Neuromuscular re-education;Manual techniques;Dry needling;Spinal Manipulations;Joint Manipulations    PT Next Visit Plan continue  with general ther ex for LE strength, trunk ROM/strength as tolerated    PT Home Exercise Plan standing hip abduction, extension with countertop support; 9/26 seated heel/toe raise, STS, march, LAQ, standing heel/toe raise, march; 10/13 seated hip abd/add, YTB given    Consulted and Agree with Plan of Care Patient  Patient will benefit from skilled therapeutic intervention in order to improve the following deficits and impairments:  Decreased activity tolerance, Decreased balance, Decreased mobility, Decreased range of motion, Decreased strength, Difficulty walking, Impaired perceived functional ability, Postural dysfunction, Improper body mechanics, Pain  Visit Diagnosis: Acute low back pain, unspecified back pain laterality, unspecified whether sciatica present  Muscle weakness (generalized)  Difficulty in walking, not elsewhere classified  Unsteadiness on feet     Problem List Patient Active Problem List   Diagnosis Date Noted   Herniation of intervertebral disc of thoracic spine with myelopathy 09/13/2020   Mild intermittent asthma without complication 81/82/9937   Right thigh pain 03/31/2018   Leukocytosis 02/07/2018   Chronic pain of both knees 01/31/2018   Pain in both lower extremities 01/13/2018   Tachycardia 01/13/2018   Chronic right shoulder pain 10/18/2017   Right hand pain 10/18/2017   Hyperlipidemia 10/11/2017   Type 2 diabetes mellitus with complication, without long-term current use of insulin ([place]) 10/11/2017   Rash 10/25/2016   Hormone replacement therapy (HRT) 09/24/2016   Screening for breast cancer 04/25/2016   Chronic low back pain 04/25/2016   Pain of right thumb 12/16/2015   Cough 12/16/2015   Endometriosis of rectovaginal septum 06/14/2015   Endometriosis 02/16/2015   Endometriosis of pelvis 10/12/2014   Uterine fibroid 12/28/2013   Obesity (BMI 30-39.9) 11/30/2013   Hypertension 03/26/2012   Anxiety and depression 03/26/2012    Routine general medical examination at a health care facility 03/26/2012    Toniann Fail, PT 12/07/2020, 10:28 AM  [facility] [address] [place], [place], [address] Phone: [phone]   Fax:  [phone]  Name: Sandra Gray MRN: [id number] Date of Birth: [birth year]

## 2020-12-14 ENCOUNTER — Other Ambulatory Visit: Payer: Self-pay

## 2020-12-14 ENCOUNTER — Telehealth: Payer: Self-pay | Admitting: Family

## 2020-12-14 ENCOUNTER — Ambulatory Visit (HOSPITAL_COMMUNITY): Payer: Medicaid Other | Attending: Neurosurgery

## 2020-12-14 DIAGNOSIS — M6281 Muscle weakness (generalized): Secondary | ICD-10-CM | POA: Insufficient documentation

## 2020-12-14 DIAGNOSIS — R262 Difficulty in walking, not elsewhere classified: Secondary | ICD-10-CM | POA: Insufficient documentation

## 2020-12-14 DIAGNOSIS — M545 Low back pain, unspecified: Secondary | ICD-10-CM | POA: Diagnosis not present

## 2020-12-14 DIAGNOSIS — R2681 Unsteadiness on feet: Secondary | ICD-10-CM | POA: Diagnosis present

## 2020-12-14 DIAGNOSIS — R778 Other specified abnormalities of plasma proteins: Secondary | ICD-10-CM

## 2020-12-14 NOTE — Telephone Encounter (Signed)
Call patient Reviewed chart, she had back surgery with Dr. [place] Gammon in August.  I hope very much she is feeling better.   Have not seen her in some time.  I certainly want to check-in.    Also 1 year ago we drew labs to look for elevation in protein, urine.  They were slightly abnormal and Dr. Grayland Ormond, hematologist/oncologist advised to repeat them in 1 years time.  I have ordered his labs.  Would you please schedule patient a follow-up as well in next 4-6 weeks and have his labs done in the next 1 to 2 weeks?

## 2020-12-14 NOTE — Therapy (Signed)
[name], [place], [address] Phone: [phone]   Fax:  [phone]  Physical Therapy Treatment and Recertification  Patient Details  Name: Sandra Gray MRN: [id number] Date of Birth: [birth year] Referring Provider (PT): Earnie Larsson, MD   Encounter Date: 12/14/2020   PT End of Session - 12/14/20 9798     Visit Number 6    Number of Visits 12    Date for PT Re-Evaluation 01/25/21    Authorization Type [place] Medicaid UHC    PT Start Time 0820    PT Stop Time 0900    PT Time Calculation (min) 40 min    Activity Tolerance Patient tolerated treatment well    Behavior During Therapy [facility] for tasks assessed/performed   tearful throughout            Past Medical History:  Diagnosis Date   Anemia    Anxiety    Asthma 02/12/2006   Bronchitis    Chronic kidney disease    "declining kidney, but it's stable right now"   Depression    Diabetes mellitus without complication ([place])    Fibroids    Hypertension 02/13/2011   Started on lisinopril   Pneumonia    Pt reports she had pneumonia at 50 years of age    Past Surgical History:  Procedure Laterality Date   DILATION AND CURETTAGE OF UTERUS  08/2011   HYSTEROSCOPY  02/15/2012   Fibroid Removal   HYSTEROSCOPY  may 2014   ROBOTIC ASSISTED SALPINGO OOPHERECTOMY Bilateral 07/28/2015   Procedure: XI ROBOTIC ASSISTED BILATERAL SALPINGO OOPHORECTOMY AND LYSIS OF ADHESIONS;  Surgeon: Everitt Amber, MD;  Location: [facility];  Service: Gynecology;  Laterality: Bilateral;   SUPRACERVICAL ABDOMINAL HYSTERECTOMY N/A 08/31/2014   Procedure: HYSTERECTOMY SUPRACERVICAL ABDOMINAL;  Surgeon: [name], MD;  Location: [facility];  Service: Gynecology;  Laterality: N/A;  Dr. Elonda Husky will assist   THORACIC DISCECTOMY Left 09/13/2020   Procedure: Left Thoracic Eight-Nine, Thoracic Ten-Eleven Microdiscectomy;  Surgeon: Earnie Larsson, MD;  Location: [facility];  Service: Neurosurgery;  Laterality:  Left;    There were no vitals filed for this visit.   Subjective Assessment - 12/14/20 0824     Subjective Pt notes continued pain when coughing/sneezing and some discomfort with reaching with left side    Limitations Standing;Walking;Sitting;Lifting;House hold activities    How long can you sit comfortably? "not very long"    How long can you stand comfortably? 3-5 min    How long can you walk comfortably? 70-140 ft    Patient Stated Goals "Be normal, walk without AD."    Pain Onset More than a month ago                [facility] PT Assessment - 12/14/20 0001       Assessment   Medical Diagnosis herniated nucleus pulposus w/myelopathy, thoracic    Referring Provider (PT) Earnie Larsson, MD    Next MD Visit 01/2021                           [facility] Adult PT Treatment/Exercise - 12/14/20 0001       Ambulation/Gait   Ambulation/Gait Yes    Ambulation/Gait Assistance 6: Modified independent (Device/Increase time)    Ambulation Distance (Feet) 210 Feet    Assistive device 4-wheeled walker    Gait Pattern Step-through pattern    Ambulation Surface Level;Indoor    Gait velocity decreased  Gait velocity - backwards retrowalk 2x20 ft    Gait Comments 2MWT      Knee/Hip Exercises: Standing   Other Standing Knee Exercises tandem stance 2x15 sec    Other Standing Knee Exercises static standing on airex pad 3x15 sec. Standing on airex 1x5 reps punches, 1x10 shoulder extension, 1x10 abduction      Knee/Hip Exercises: Seated   Long Arc Quad Strengthening;Both;2 sets;10 reps    Other Seated Knee/Hip Exercises heel/toe raises 2x10    Marching Strengthening;Both;2 sets;5 reps    Abduction/Adduction  Strengthening;Both;2 sets;10 reps                       PT Short Term Goals - 12/14/20 0824       PT SHORT TERM GOAL #1   Title Patient will be independent with HEP in order to improve functional outcomes    Baseline independent with seated and standing  OKC PRE    Time 3    Period Weeks    Status Achieved    Target Date 01/04/21      PT SHORT TERM GOAL #2   Title Patient will report at least 25% improvement in symptoms for improved quality of life.    Baseline 25% improvement    Time 3    Period Weeks    Status Achieved    Target Date 11/25/20      PT SHORT TERM GOAL #3   Title Demonstrate trunk flexion WFL to improve mobility    Baseline able to perform some spine flexion and reach to 18" from ground    Time 3    Period Weeks    Status On-going    Target Date 11/25/20               PT Long Term Goals - 12/14/20 0829       PT LONG TERM GOAL #1   Title Patient will be able to complete 5x STS in under 11.4 seconds in order to reduce the risk of falls.    Baseline 39 sec from 18" seat height    Time 6    Period Weeks    Status On-going      PT LONG TERM GOAL #2   Title Patient will be able to ambulate at least 200 feet in 2MWT in order to demonstrate improved gait speed for community ambulation.    Baseline 210 ft with rollator    Time 6    Period Weeks    Status Achieved                   Plan - 12/14/20 0856     Clinical Impression Statement Progressing with STG/LTG and able to meet serveral this reporting period.  Continues to demonsrtate some kinesiphobia as she is very worried about re-injury. Counseled pt on fact that her surgical site is likely well healed given the absence of neurological symptoms.  Demonstrates improved gait speed by 2MWT and able to initiate trunk flexion and 5xSTS for repetition where she was previously unable/unwilling to do so. Continued sessions indicated to improve physical and functional condition    Personal Factors and Comorbidities Behavior Pattern;Time since onset of injury/illness/exacerbation;Past/Current Experience    Examination-Activity Limitations Bed Mobility;Bend;Carry;Lift;Stand;Stairs;Squat;Reach Overhead;Locomotion Level;Transfers    Examination-Participation  Restrictions Church;Cleaning;Community Activity;Laundry;Yard Work;Shop;Occupation;Meal Prep    Stability/Clinical Decision Making Stable/Uncomplicated    Rehab Potential Fair    PT Frequency 1x / week    PT Duration 6 weeks  PT Treatment/Interventions ADLs/Self Care Home Management;Electrical Stimulation;DME Instruction;Ultrasound;Traction;Gait training;Stair training;Functional mobility training;Therapeutic activities;Therapeutic exercise;Balance training;Patient/family education;Neuromuscular re-education;Manual techniques;Dry needling;Spinal Manipulations;Joint Manipulations    PT Next Visit Plan continue with general ther ex for LE strength, trunk ROM/strength as tolerated    PT Home Exercise Plan standing hip abduction, extension with countertop support; 9/26 seated heel/toe raise, STS, march, LAQ, standing heel/toe raise, march; 10/13 seated hip abd/add, YTB given    Consulted and Agree with Plan of Care Patient             Patient will benefit from skilled therapeutic intervention in order to improve the following deficits and impairments:  Decreased activity tolerance, Decreased balance, Decreased mobility, Decreased range of motion, Decreased strength, Difficulty walking, Impaired perceived functional ability, Postural dysfunction, Improper body mechanics, Pain  Visit Diagnosis: Acute low back pain, unspecified back pain laterality, unspecified whether sciatica present  Muscle weakness (generalized)  Difficulty in walking, not elsewhere classified  Unsteadiness on feet     Problem List Patient Active Problem List   Diagnosis Date Noted   Herniation of intervertebral disc of thoracic spine with myelopathy 09/13/2020   Mild intermittent asthma without complication 66/07/43   Right thigh pain 03/31/2018   Leukocytosis 02/07/2018   Chronic pain of both knees 01/31/2018   Pain in both lower extremities 01/13/2018   Tachycardia 01/13/2018   Chronic right shoulder pain  10/18/2017   Right hand pain 10/18/2017   Hyperlipidemia 10/11/2017   Type 2 diabetes mellitus with complication, without long-term current use of insulin ([place]) 10/11/2017   Rash 10/25/2016   Hormone replacement therapy (HRT) 09/24/2016   Screening for breast cancer 04/25/2016   Chronic low back pain 04/25/2016   Pain of right thumb 12/16/2015   Cough 12/16/2015   Endometriosis of rectovaginal septum 06/14/2015   Endometriosis 02/16/2015   Endometriosis of pelvis 10/12/2014   Uterine fibroid 12/28/2013   Obesity (BMI 30-39.9) 11/30/2013   Hypertension 03/26/2012   Anxiety and depression 03/26/2012   Routine general medical examination at a health care facility 03/26/2012    Toniann Fail, PT 12/14/2020, 8:59 AM  [facility] [address] [place], [place], [address] Phone: [phone]   Fax:  [phone]  Name: Aniqua Briere MRN: [id number] Date of Birth: 02/16/1970

## 2020-12-16 DIAGNOSIS — I10 Essential (primary) hypertension: Secondary | ICD-10-CM | POA: Diagnosis not present

## 2020-12-16 DIAGNOSIS — Z79899 Other long term (current) drug therapy: Secondary | ICD-10-CM | POA: Diagnosis not present

## 2020-12-16 DIAGNOSIS — D509 Iron deficiency anemia, unspecified: Secondary | ICD-10-CM | POA: Diagnosis not present

## 2020-12-16 DIAGNOSIS — E119 Type 2 diabetes mellitus without complications: Secondary | ICD-10-CM | POA: Diagnosis not present

## 2020-12-16 DIAGNOSIS — E785 Hyperlipidemia, unspecified: Secondary | ICD-10-CM | POA: Diagnosis not present

## 2020-12-16 NOTE — Telephone Encounter (Signed)
LMTCB

## 2020-12-20 DIAGNOSIS — E785 Hyperlipidemia, unspecified: Secondary | ICD-10-CM | POA: Diagnosis not present

## 2020-12-20 DIAGNOSIS — G4700 Insomnia, unspecified: Secondary | ICD-10-CM | POA: Diagnosis not present

## 2020-12-20 DIAGNOSIS — M546 Pain in thoracic spine: Secondary | ICD-10-CM | POA: Diagnosis not present

## 2020-12-20 DIAGNOSIS — F322 Major depressive disorder, single episode, severe without psychotic features: Secondary | ICD-10-CM | POA: Diagnosis not present

## 2020-12-20 DIAGNOSIS — Z23 Encounter for immunization: Secondary | ICD-10-CM | POA: Diagnosis not present

## 2020-12-20 DIAGNOSIS — I10 Essential (primary) hypertension: Secondary | ICD-10-CM | POA: Diagnosis not present

## 2020-12-20 DIAGNOSIS — Z79899 Other long term (current) drug therapy: Secondary | ICD-10-CM | POA: Diagnosis not present

## 2020-12-20 DIAGNOSIS — Z6841 Body Mass Index (BMI) 40.0 and over, adult: Secondary | ICD-10-CM | POA: Diagnosis not present

## 2020-12-20 DIAGNOSIS — E119 Type 2 diabetes mellitus without complications: Secondary | ICD-10-CM | POA: Diagnosis not present

## 2020-12-20 DIAGNOSIS — G8929 Other chronic pain: Secondary | ICD-10-CM | POA: Diagnosis not present

## 2020-12-27 NOTE — Telephone Encounter (Signed)
Can you circle back here?

## 2020-12-27 NOTE — Telephone Encounter (Signed)
I called and spoke with patient. She has actually changed her PCP & I have changed in her chart.

## 2020-12-28 NOTE — Telephone Encounter (Signed)
I have called patient to confirm address & I have placed to be mailed.

## 2020-12-28 NOTE — Telephone Encounter (Signed)
Please mail letter and include labs from 12/29/2019  Dr Ms Len Childs,   [facility] you are doing well and understand you are now following with [facility]. If you would kindly share the below letter with her as I wanted to ensure that you had follow up to abnormal urine studies last year.   Please dont hesitate for you or her to call me if any questions per below. Most importantly, I want to ensure your new primary was aware to recheck.   Also 1 year ago, 12/29/2019 we drew labs to look for elevation of protein in your urine.  They were slightly abnormal and Dr. Grayland Ormond, hematologist/oncologist advised to repeat them in 1 years time.  I have included lab results in the this letter. Please ensure you have urine, serum studies reordered to ensure normalized.   Catalina Antigua

## 2020-12-29 ENCOUNTER — Ambulatory Visit (HOSPITAL_COMMUNITY): Payer: Medicaid Other

## 2020-12-29 ENCOUNTER — Other Ambulatory Visit: Payer: Self-pay

## 2020-12-29 ENCOUNTER — Encounter (HOSPITAL_COMMUNITY): Payer: Self-pay

## 2020-12-29 DIAGNOSIS — M545 Low back pain, unspecified: Secondary | ICD-10-CM | POA: Diagnosis not present

## 2020-12-29 DIAGNOSIS — M6281 Muscle weakness (generalized): Secondary | ICD-10-CM

## 2020-12-29 DIAGNOSIS — R262 Difficulty in walking, not elsewhere classified: Secondary | ICD-10-CM

## 2020-12-29 DIAGNOSIS — R2681 Unsteadiness on feet: Secondary | ICD-10-CM

## 2020-12-29 NOTE — Therapy (Signed)
[place], [place], [address] Phone: [phone]   Fax:  [phone]  Physical Therapy Treatment  Patient Details  Name: Sandra Gray MRN: [id number] Date of Birth: Oct 23, 1970 Referring Provider (PT): Earnie Larsson, MD   Encounter Date: 12/29/2020   PT End of Session - 12/29/20 0921     Visit Number 7    Number of Visits 12    Date for PT Re-Evaluation 01/25/21    Authorization Type [place] Medicaid UHC    PT Start Time [phone]    PT Stop Time 0930    PT Time Calculation (min) 43 min    Activity Tolerance Patient tolerated treatment well    Behavior During Therapy [facility] for tasks assessed/performed   tearful throughout            Past Medical History:  Diagnosis Date   Anemia    Anxiety    Asthma 02/12/2006   Bronchitis    Chronic kidney disease    "declining kidney, but it's stable right now"   Depression    Diabetes mellitus without complication ([place])    Fibroids    Hypertension 02/13/2011   Started on lisinopril   Pneumonia    Pt reports she had pneumonia at 50 years of age    Past Surgical History:  Procedure Laterality Date   DILATION AND CURETTAGE OF UTERUS  08/2011   HYSTEROSCOPY  02/15/2012   Fibroid Removal   HYSTEROSCOPY  may 2014   ROBOTIC ASSISTED SALPINGO OOPHERECTOMY Bilateral 07/28/2015   Procedure: XI ROBOTIC ASSISTED BILATERAL SALPINGO OOPHORECTOMY AND LYSIS OF ADHESIONS;  Surgeon: Everitt Amber, MD;  [facility];  Service: Gynecology;  Laterality: Bilateral;   SUPRACERVICAL ABDOMINAL HYSTERECTOMY N/A 08/31/2014   Procedure: HYSTERECTOMY SUPRACERVICAL ABDOMINAL;  Surgeon: [name], MD;  Location: [facility];  Service: Gynecology;  Laterality: N/A;  Dr. Elonda Husky will assist   THORACIC DISCECTOMY Left 09/13/2020   Procedure: Left Thoracic Eight-Nine, Thoracic Ten-Eleven Microdiscectomy;  Surgeon: Earnie Larsson, MD;  Location: [facility];  Service: Neurosurgery;  Laterality: Left;    There  were no vitals filed for this visit.   Subjective Assessment - 12/29/20 0904     Subjective Pt reports a "pop" in her right knee Tuesday night and has been in pain ever since. Was lying in bed and attempting to turn over when pop occurred. Follow up with MD showed inflammation at surgery site. MD wants her to take ibuprofen.    Limitations Standing;Walking;Sitting;Lifting;House hold activities    How long can you sit comfortably? "not very long"    How long can you stand comfortably? 3-5 min    How long can you walk comfortably? 70-140 ft    Patient Stated Goals "Be normal, walk without AD."    Currently in Pain? Yes    Pain Score 10-Worst pain ever   during ambulation   Pain Orientation Right;Anterior    Pain Descriptors / Indicators Sore    Pain Onset More than a month ago    Aggravating Factors  right knee lower quad    Pain Relieving Factors ambulation                [facility] Adult PT Treatment/Exercise - 12/29/20 0001       Ambulation/Gait   Ambulation/Gait Yes    Ambulation/Gait Assistance 6: Modified independent (Device/Increase time)    Ambulation Distance (Feet) 120 Feet   60 ft x2   Assistive device 4-wheeled walker  Gait Pattern Step-through pattern;Decreased stride length;Decreased stance time - right;Right flexed knee in stance;Antalgic    Ambulation Surface Level;Indoor    Gait velocity decreased      Knee/Hip Exercises: Aerobic   Nustep level 1 x 6 min      Knee/Hip Exercises: Seated   Long Arc Quad --      Knee/Hip Exercises: Supine   Quad Sets Strengthening;Both;10 reps;1 set    Short Arc [employer] AROM;Strengthening;Both;2 sets;10 reps    Heel Slides AROM;Strengthening;Both;1 set;10 reps    Hip Adduction Isometric Strengthening;Both;1 set;10 reps    Bridges Strengthening;Both;1 set;10 reps    Other Supine Knee/Hip Exercises hip abd/add x10 both                     PT Education - 12/29/20 0917     Education Details inflammation,  recovery of muscle strain warming up area    Person(s) Educated Patient    Methods Explanation    Comprehension Verbalized understanding              PT Short Term Goals - 12/14/20 0824       PT SHORT TERM GOAL #1   Title Patient will be independent with HEP in order to improve functional outcomes    Baseline independent with seated and standing OKC PRE    Time 3    Period Weeks    Status Achieved    Target Date 01/04/21      PT SHORT TERM GOAL #2   Title Patient will report at least 25% improvement in symptoms for improved quality of life.    Baseline 25% improvement    Time 3    Period Weeks    Status Achieved    Target Date 11/25/20      PT SHORT TERM GOAL #3   Title Demonstrate trunk flexion WFL to improve mobility    Baseline able to perform some spine flexion and reach to 18" from ground    Time 3    Period Weeks    Status On-going    Target Date 11/25/20               PT Long Term Goals - 12/14/20 0829       PT LONG TERM GOAL #1   Title Patient will be able to complete 5x STS in under 11.4 seconds in order to reduce the risk of falls.    Baseline 39 sec from 18" seat height    Time 6    Period Weeks    Status On-going      PT LONG TERM GOAL #2   Title Patient will be able to ambulate at least 200 feet in 2MWT in order to demonstrate improved gait speed for community ambulation.    Baseline 210 ft with rollator    Time 6    Period Weeks    Status Achieved                   Plan - 12/29/20 0934     Clinical Impression Statement Patient in right knee pain and antaligic holding right knee in flexion throughout phases of gait. Patient tender to palpation over distal right quad; no pain complaints with patellar mobs. Session focused on bed level exercises within tolerable pain levels and physical assistance given until patient gains confidence and pain level decreased so patient could perform independently and slowly. Ended session with 6  minutes on NuStep Level 1 using legs  only. Patient pleasantly surprised at decreased pain level with exercises, NuStep and ambulation at end of session. Patient's ambulation and pain level were much improved at end of session. Educated patient on inflammation and performing lower level exercises to decrease pain and facilitate recovery. Patient would continue to benefit from skilled physical therapy to reduce impairment and improve functional level.    Personal Factors and Comorbidities Behavior Pattern;Time since onset of injury/illness/exacerbation;Past/Current Experience    Examination-Activity Limitations Bed Mobility;Bend;Carry;Lift;Stand;Stairs;Squat;Reach Overhead;Locomotion Level;Transfers    Examination-Participation Restrictions Church;Cleaning;Community Activity;Laundry;Yard Work;Shop;Occupation;Meal Prep    Stability/Clinical Decision Making Stable/Uncomplicated    Rehab Potential Fair    PT Frequency 1x / week    PT Duration 6 weeks    PT Treatment/Interventions ADLs/Self Care Home Management;Electrical Stimulation;DME Instruction;Ultrasound;Traction;Gait training;Stair training;Functional mobility training;Therapeutic activities;Therapeutic exercise;Balance training;Patient/family education;Neuromuscular re-education;Manual techniques;Dry needling;Spinal Manipulations;Joint Manipulations    PT Next Visit Plan continue with general ther ex for LE strength, trunk ROM/strength as tolerated    PT Home Exercise Plan standing hip abduction, extension with countertop support; 9/26 seated heel/toe raise, STS, march, LAQ, standing heel/toe raise, march; 10/13 seated hip abd/add, YTB given    Consulted and Agree with Plan of Care Patient             Patient will benefit from skilled therapeutic intervention in order to improve the following deficits and impairments:  Decreased activity tolerance, Decreased balance, Decreased mobility, Decreased range of motion, Decreased strength, Difficulty  walking, Impaired perceived functional ability, Postural dysfunction, Improper body mechanics, Pain  Visit Diagnosis: Acute low back pain, unspecified back pain laterality, unspecified whether sciatica present  Muscle weakness (generalized)  Difficulty in walking, not elsewhere classified  Unsteadiness on feet     Problem List Patient Active Problem List   Diagnosis Date Noted   Herniation of intervertebral disc of thoracic spine with myelopathy 09/13/2020   Mild intermittent asthma without complication 89/38/1017   Right thigh pain 03/31/2018   Leukocytosis 02/07/2018   Chronic pain of both knees 01/31/2018   Pain in both lower extremities 01/13/2018   Tachycardia 01/13/2018   Chronic right shoulder pain 10/18/2017   Right hand pain 10/18/2017   Hyperlipidemia 10/11/2017   Type 2 diabetes mellitus with complication, without long-term current use of insulin ([place]) 10/11/2017   Rash 10/25/2016   Hormone replacement therapy (HRT) 09/24/2016   Screening for breast cancer 04/25/2016   Chronic low back pain 04/25/2016   Pain of right thumb 12/16/2015   Cough 12/16/2015   Endometriosis of rectovaginal septum 06/14/2015   Endometriosis 02/16/2015   Endometriosis of pelvis 10/12/2014   Uterine fibroid 12/28/2013   Obesity (BMI 30-39.9) 11/30/2013   Hypertension 03/26/2012   Anxiety and depression 03/26/2012   Routine general medical examination at a health care facility 03/26/2012   Pamala Hurry D. Hartnett-Rands, MS, PT Per [place] [phone]  Jeannie Done, PT 12/29/2020, 9:40 AM  [facility] [address] [place], [place], [address] Phone: [phone]   Fax:  [phone]  Name: Sandra Gray MRN: [id number] Date of Birth: November 13, 1970

## 2021-01-02 NOTE — Telephone Encounter (Signed)
noted 

## 2021-01-10 ENCOUNTER — Ambulatory Visit (HOSPITAL_COMMUNITY): Payer: Medicaid Other

## 2021-01-10 ENCOUNTER — Encounter (HOSPITAL_COMMUNITY): Payer: Self-pay

## 2021-01-10 ENCOUNTER — Other Ambulatory Visit: Payer: Self-pay

## 2021-01-10 DIAGNOSIS — M545 Low back pain, unspecified: Secondary | ICD-10-CM

## 2021-01-10 DIAGNOSIS — R2681 Unsteadiness on feet: Secondary | ICD-10-CM

## 2021-01-10 DIAGNOSIS — M6281 Muscle weakness (generalized): Secondary | ICD-10-CM

## 2021-01-10 DIAGNOSIS — R262 Difficulty in walking, not elsewhere classified: Secondary | ICD-10-CM

## 2021-01-10 NOTE — Therapy (Signed)
[facility] [address] [place], [place], [address] Phone: [phone]   Fax:  [phone]  Physical Therapy Treatment  Patient Details  Name: Sandra Gray MRN: [id number] Date of Birth: [birth year] Referring Provider (PT): Earnie Larsson, MD   Encounter Date: 01/10/2021   PT End of Session - 01/10/21 0903     Visit Number 8    Number of Visits 12    Date for PT Re-Evaluation 01/25/21    Authorization Type [place] Medicaid UHC    PT Start Time 0902    PT Stop Time 0945    PT Time Calculation (min) 43 min    Activity Tolerance Patient tolerated treatment well    Behavior During Therapy [facility] for tasks assessed/performed   tearful throughout            Past Medical History:  Diagnosis Date   Anemia    Anxiety    Asthma 02/12/2006   Bronchitis    Chronic kidney disease    "declining kidney, but it's stable right now"   Depression    Diabetes mellitus without complication ([place])    Fibroids    Hypertension 02/13/2011   Started on lisinopril   Pneumonia    Pt reports she had pneumonia at 50 years of age    Past Surgical History:  Procedure Laterality Date   DILATION AND CURETTAGE OF UTERUS  08/2011   HYSTEROSCOPY  02/15/2012   Fibroid Removal   HYSTEROSCOPY  may 2014   ROBOTIC ASSISTED SALPINGO OOPHERECTOMY Bilateral 07/28/2015   Procedure: XI ROBOTIC ASSISTED BILATERAL SALPINGO OOPHORECTOMY AND LYSIS OF ADHESIONS;  Surgeon: Everitt Amber, MD;  Location: [facility];  Service: Gynecology;  Laterality: Bilateral;   SUPRACERVICAL ABDOMINAL HYSTERECTOMY N/A 08/31/2014   Procedure: HYSTERECTOMY SUPRACERVICAL ABDOMINAL;  Surgeon: [name], MD;  Location: [facility];  Service: Gynecology;  Laterality: N/A;  Dr. Elonda Husky will assist   THORACIC DISCECTOMY Left 09/13/2020   Procedure: Left Thoracic Eight-Nine, Thoracic Ten-Eleven Microdiscectomy;  Surgeon: Earnie Larsson, MD;  Location[facility];  Service: Neurosurgery;  Laterality: Left;    There  were no vitals filed for this visit.   Subjective Assessment - 01/10/21 0905     Subjective Notes her right knee is feeling better and her back is getting stronger    Limitations Standing;Walking;Sitting;Lifting;House hold activities    How long can you sit comfortably? "not very long"    How long can you stand comfortably? 3-5 min    How long can you walk comfortably? 70-140 ft    Patient Stated Goals "Be normal, walk without AD."    Currently in Pain? Yes    Pain Location Back    Pain Orientation Right;Anterior    Pain Descriptors / Indicators Sore    Pain Type Chronic pain    Pain Onset More than a month ago                               [facility] Adult PT Treatment/Exercise - 01/10/21 0001       Ambulation/Gait   Ambulation/Gait Yes    Ambulation/Gait Assistance 6: Modified independent (Device/Increase time)    Ambulation Distance (Feet) 200 Feet    Assistive device 4-wheeled walker    Gait Pattern Step-through pattern    Ambulation Surface Level;Indoor    Gait velocity increased    Gait Comments cues in posture and core engagment to reduce need for BUE support with  slow motion practice      Exercises   Exercises Lumbar      Lumbar Exercises: Standing   Functional Squats 20 reps      Lumbar Exercises: Seated   Hip Flexion on Ball Strengthening;Both;20 reps    Hip Flexion on Ball Limitations on swiss ball    Other Seated Lumbar Exercises tall sitting on swiss ball 6x10 sec      Lumbar Exercises: Supine   Ab Set 15 reps;2 seconds    Bridge 15 reps    Isometric Hip Flexion 10 reps;2 seconds      Knee/Hip Exercises: Standing   Heel Raises Both;2 sets;10 reps    Knee Flexion Strengthening;Both;2 sets;10 reps    Hip Flexion Stengthening;Both;2 sets;10 reps                       PT Short Term Goals - 12/14/20 1610       PT SHORT TERM GOAL #1   Title Patient will be independent with HEP in order to improve functional outcomes     Baseline independent with seated and standing OKC PRE    Time 3    Period Weeks    Status Achieved    Target Date 01/04/21      PT SHORT TERM GOAL #2   Title Patient will report at least 25% improvement in symptoms for improved quality of life.    Baseline 25% improvement    Time 3    Period Weeks    Status Achieved    Target Date 11/25/20      PT SHORT TERM GOAL #3   Title Demonstrate trunk flexion WFL to improve mobility    Baseline able to perform some spine flexion and reach to 18" from ground    Time 3    Period Weeks    Status On-going    Target Date 11/25/20               PT Long Term Goals - 12/14/20 0829       PT LONG TERM GOAL #1   Title Patient will be able to complete 5x STS in under 11.4 seconds in order to reduce the risk of falls.    Baseline 39 sec from 18" seat height    Time 6    Period Weeks    Status On-going      PT LONG TERM GOAL #2   Title Patient will be able to ambulate at least 200 feet in 2MWT in order to demonstrate improved gait speed for community ambulation.    Baseline 210 ft with rollator    Time 6    Period Weeks    Status Achieved                   Plan - 01/10/21 9604     Clinical Impression Statement Progressing well with POC details and demonstrating overall improvement in gait speed and general mobility with less anxiety/guarding noted.  Able to initiate core stabilization exercises to improve proximal stability to progress for functional lifts and ambulation without AD    Personal Factors and Comorbidities Behavior Pattern;Time since onset of injury/illness/exacerbation;Past/Current Experience    Examination-Activity Limitations Bed Mobility;Bend;Carry;Lift;Stand;Stairs;Squat;Reach Overhead;Locomotion Level;Transfers    Examination-Participation Restrictions Church;Cleaning;Community Activity;Laundry;Yard Work;Shop;Occupation;Meal Prep    Stability/Clinical Decision Making Stable/Uncomplicated    Rehab Potential  Fair    PT Frequency 1x / week    PT Duration 6 weeks    PT Treatment/Interventions  ADLs/Self Care Home Management;Electrical Stimulation;DME Instruction;Ultrasound;Traction;Gait training;Stair training;Functional mobility training;Therapeutic activities;Therapeutic exercise;Balance training;Patient/family education;Neuromuscular re-education;Manual techniques;Dry needling;Spinal Manipulations;Joint Manipulations    PT Next Visit Plan core stabilization, body-weight support treadmill?    PT Home Exercise Plan standing hip abduction, extension with countertop support; 9/26 seated heel/toe raise, STS, march, LAQ, standing heel/toe raise, march; 10/13 seated hip abd/add, YTB given    Consulted and Agree with Plan of Care Patient             Patient will benefit from skilled therapeutic intervention in order to improve the following deficits and impairments:  Decreased activity tolerance, Decreased balance, Decreased mobility, Decreased range of motion, Decreased strength, Difficulty walking, Impaired perceived functional ability, Postural dysfunction, Improper body mechanics, Pain  Visit Diagnosis: Acute low back pain, unspecified back pain laterality, unspecified whether sciatica present  Muscle weakness (generalized)  Difficulty in walking, not elsewhere classified  Unsteadiness on feet     Problem List Patient Active Problem List   Diagnosis Date Noted   Herniation of intervertebral disc of thoracic spine with myelopathy 09/13/2020   Mild intermittent asthma without complication 93/23/5573   Right thigh pain 03/31/2018   Leukocytosis 02/07/2018   Chronic pain of both knees 01/31/2018   Pain in both lower extremities 01/13/2018   Tachycardia 01/13/2018   Chronic right shoulder pain 10/18/2017   Right hand pain 10/18/2017   Hyperlipidemia 10/11/2017   Type 2 diabetes mellitus with complication, without long-term current use of insulin ([place]) 10/11/2017   Rash 10/25/2016    Hormone replacement therapy (HRT) 09/24/2016   Screening for breast cancer 04/25/2016   Chronic low back pain 04/25/2016   Pain of right thumb 12/16/2015   Cough 12/16/2015   Endometriosis of rectovaginal septum 06/14/2015   Endometriosis 02/16/2015   Endometriosis of pelvis 10/12/2014   Uterine fibroid 12/28/2013   Obesity (BMI 30-39.9) 11/30/2013   Hypertension 03/26/2012   Anxiety and depression 03/26/2012   Routine general medical examination at a health care facility 03/26/2012    Toniann Fail, PT 01/10/2021, 9:42 AM  [facility] [address] [place], [place], [address] Phone: [phone]   Fax:  [phone]  Name: Lindaann Gradilla MRN: [id number] Date of Birth: 28-Mar-1970

## 2021-01-17 ENCOUNTER — Ambulatory Visit (HOSPITAL_COMMUNITY): Payer: Medicaid Other | Attending: Neurosurgery

## 2021-01-17 ENCOUNTER — Other Ambulatory Visit: Payer: Self-pay

## 2021-01-17 DIAGNOSIS — R262 Difficulty in walking, not elsewhere classified: Secondary | ICD-10-CM | POA: Insufficient documentation

## 2021-01-17 DIAGNOSIS — M545 Low back pain, unspecified: Secondary | ICD-10-CM | POA: Insufficient documentation

## 2021-01-17 DIAGNOSIS — M6281 Muscle weakness (generalized): Secondary | ICD-10-CM | POA: Diagnosis present

## 2021-01-17 DIAGNOSIS — R2681 Unsteadiness on feet: Secondary | ICD-10-CM | POA: Diagnosis present

## 2021-01-17 NOTE — Therapy (Signed)
[facility] [place], [place], [address] Phone: [phone]   Fax:  [phone]  Physical Therapy Treatment  Patient Details  Name: Sandra Gray MRN: [id number] Date of Birth: [birth year] Referring Provider (PT): Earnie Larsson, MD   Encounter Date: 01/17/2021   PT End of Session - 01/17/21 0909     Visit Number 9    Number of Visits 12    Date for PT Re-Evaluation 01/25/21    Authorization Type [place] Medicaid UHC    PT Start Time 0905    PT Stop Time 0945    PT Time Calculation (min) 40 min    Activity Tolerance Patient tolerated treatment well    Behavior During Therapy [facility] for tasks assessed/performed   tearful throughout            Past Medical History:  Diagnosis Date   Anemia    Anxiety    Asthma 02/12/2006   Bronchitis    Chronic kidney disease    "declining kidney, but it's stable right now"   Depression    Diabetes mellitus without complication ([place])    Fibroids    Hypertension 02/13/2011   Started on lisinopril   Pneumonia    Pt reports she had pneumonia at 50 years of age    Past Surgical History:  Procedure Laterality Date   DILATION AND CURETTAGE OF UTERUS  08/2011   HYSTEROSCOPY  02/15/2012   Fibroid Removal   HYSTEROSCOPY  may 2014   ROBOTIC ASSISTED SALPINGO OOPHERECTOMY Bilateral 07/28/2015   Procedure: XI ROBOTIC ASSISTED BILATERAL SALPINGO OOPHORECTOMY AND LYSIS OF ADHESIONS;  Surgeon: Everitt Amber, MD;  [facility];  Service: Gynecology;  Laterality: Bilateral;   SUPRACERVICAL ABDOMINAL HYSTERECTOMY N/A 08/31/2014   Procedure: HYSTERECTOMY SUPRACERVICAL ABDOMINAL;  Surgeon: [name], MD;  Location: [facility];  Service: Gynecology;  Laterality: N/A;  Dr. Elonda Husky will assist   THORACIC DISCECTOMY Left 09/13/2020   Procedure: Left Thoracic Eight-Nine, Thoracic Ten-Eleven Microdiscectomy;  Surgeon: Earnie Larsson, MD;  Location: [place] [facility];  Service: Neurosurgery;  Laterality: Left;    There were  no vitals filed for this visit.   Subjective Assessment - 01/17/21 0907     Subjective Pt notes some worse pain in her LLE with onset of rainy weather. Denies parasthesias or numbess in LLE    Limitations Standing;Walking;Sitting;Lifting;House hold activities    How long can you sit comfortably? "not very long"    How long can you stand comfortably? 3-5 min    How long can you walk comfortably? 70-140 ft    Patient Stated Goals "Be normal, walk without AD."    Pain Onset More than a month ago                               [facility] Adult PT Treatment/Exercise - 01/17/21 0001       Lumbar Exercises: Standing   Other Standing Lumbar Exercises paloff press 1x15 reps green band      Lumbar Exercises: Seated   Other Seated Lumbar Exercises seated rows, shoulder extension sitting swiss ball 2x15                       PT Short Term Goals - 12/14/20 7673       PT SHORT TERM GOAL #1   Title Patient will be independent with HEP in order to improve functional outcomes  Baseline independent with seated and standing OKC PRE    Time 3    Period Weeks    Status Achieved    Target Date 01/04/21      PT SHORT TERM GOAL #2   Title Patient will report at least 25% improvement in symptoms for improved quality of life.    Baseline 25% improvement    Time 3    Period Weeks    Status Achieved    Target Date 11/25/20      PT SHORT TERM GOAL #3   Title Demonstrate trunk flexion WFL to improve mobility    Baseline able to perform some spine flexion and reach to 18" from ground    Time 3    Period Weeks    Status On-going    Target Date 11/25/20               PT Long Term Goals - 12/14/20 0829       PT LONG TERM GOAL #1   Title Patient will be able to complete 5x STS in under 11.4 seconds in order to reduce the risk of falls.    Baseline 39 sec from 18" seat height    Time 6    Period Weeks    Status On-going      PT LONG TERM GOAL #2   Title  Patient will be able to ambulate at least 200 feet in 2MWT in order to demonstrate improved gait speed for community ambulation.    Baseline 210 ft with rollator    Time 6    Period Weeks    Status Achieved                   Plan - 01/17/21 0941     Clinical Impression Statement Progressing with strengthening regimen and reports no increase in symptoms and feels more capable overall for mobility.  Pt notes some continued discomfort in periscapular area when reaching left arm behind back.  No ROM limitation and chief complaint is muscular pain, no spasm or hypertrophy noted.  Continue for next session to address HEP and perform D/C assessment    Personal Factors and Comorbidities Behavior Pattern;Time since onset of injury/illness/exacerbation;Past/Current Experience    Examination-Activity Limitations Bed Mobility;Bend;Carry;Lift;Stand;Stairs;Squat;Reach Overhead;Locomotion Level;Transfers    Examination-Participation Restrictions Church;Cleaning;Community Activity;Laundry;Yard Work;Shop;Occupation;Meal Prep    Stability/Clinical Decision Making Stable/Uncomplicated    Rehab Potential Fair    PT Frequency 1x / week    PT Duration 6 weeks    PT Treatment/Interventions ADLs/Self Care Home Management;Electrical Stimulation;DME Instruction;Ultrasound;Traction;Gait training;Stair training;Functional mobility training;Therapeutic activities;Therapeutic exercise;Balance training;Patient/family education;Neuromuscular re-education;Manual techniques;Dry needling;Spinal Manipulations;Joint Manipulations    PT Next Visit Plan D/C to HEP    PT Home Exercise Plan standing hip abduction, extension with countertop support; 9/26 seated heel/toe raise, STS, march, LAQ, standing heel/toe raise, march; 10/13 seated hip abd/add, YTB given    Consulted and Agree with Plan of Care Patient             Patient will benefit from skilled therapeutic intervention in order to improve the following deficits  and impairments:  Decreased activity tolerance, Decreased balance, Decreased mobility, Decreased range of motion, Decreased strength, Difficulty walking, Impaired perceived functional ability, Postural dysfunction, Improper body mechanics, Pain  Visit Diagnosis: Acute low back pain, unspecified back pain laterality, unspecified whether sciatica present  Muscle weakness (generalized)  Difficulty in walking, not elsewhere classified  Unsteadiness on feet     Problem List Patient Active Problem List  Diagnosis Date Noted   Herniation of intervertebral disc of thoracic spine with myelopathy 09/13/2020   Mild intermittent asthma without complication 22/57/5051   Right thigh pain 03/31/2018   Leukocytosis 02/07/2018   Chronic pain of both knees 01/31/2018   Pain in both lower extremities 01/13/2018   Tachycardia 01/13/2018   Chronic right shoulder pain 10/18/2017   Right hand pain 10/18/2017   Hyperlipidemia 10/11/2017   Type 2 diabetes mellitus with complication, without long-term current use of insulin ([place]) 10/11/2017   Rash 10/25/2016   Hormone replacement therapy (HRT) 09/24/2016   Screening for breast cancer 04/25/2016   Chronic low back pain 04/25/2016   Pain of right thumb 12/16/2015   Cough 12/16/2015   Endometriosis of rectovaginal septum 06/14/2015   Endometriosis 02/16/2015   Endometriosis of pelvis 10/12/2014   Uterine fibroid 12/28/2013   Obesity (BMI 30-39.9) 11/30/2013   Hypertension 03/26/2012   Anxiety and depression 03/26/2012   Routine general medical examination at a health care facility 03/26/2012    Toniann Fail, PT 01/17/2021, 9:43 AM  [facility] [address] [place], [place], [address] Phone: [phone]   Fax:  [phone]  Name: Sandra Gray MRN: [id number] Date of Birth: 21-Sep-1970

## 2021-01-24 ENCOUNTER — Other Ambulatory Visit: Payer: Self-pay

## 2021-01-24 ENCOUNTER — Ambulatory Visit (HOSPITAL_COMMUNITY): Payer: Medicaid Other

## 2021-01-24 DIAGNOSIS — R262 Difficulty in walking, not elsewhere classified: Secondary | ICD-10-CM

## 2021-01-24 DIAGNOSIS — M545 Low back pain, unspecified: Secondary | ICD-10-CM

## 2021-01-24 DIAGNOSIS — R2681 Unsteadiness on feet: Secondary | ICD-10-CM

## 2021-01-24 DIAGNOSIS — M6281 Muscle weakness (generalized): Secondary | ICD-10-CM

## 2021-01-24 NOTE — Therapy (Signed)
[facility] [address] [place], [place], [address] Phone: [phone]   Fax:  [phone]  Physical Therapy Treatment and D/C Summary  Patient Details  Name: Sandra Gray MRN: [id number] Date of Birth: [birth year] Referring Provider (PT): Earnie Larsson, MD  PHYSICAL THERAPY DISCHARGE SUMMARY  Visits from Start of Care: 10  Current functional level related to goals / functional outcomes: See below   Remaining deficits: LE/core generalized weakness, reduced gait speed, dependence on AD for gait   Education / Equipment: HEP   Patient agrees to discharge. Patient goals were partially met. Patient is being discharged due to maximized rehab potential.   Encounter Date: 01/24/2021   PT End of Session - 01/24/21 0900     Visit Number 10    Number of Visits 12    Date for PT Re-Evaluation 01/25/21    Authorization Type [place] Medicaid UHC    PT Start Time 0900    PT Stop Time 0945    PT Time Calculation (min) 45 min    Activity Tolerance Patient tolerated treatment well    Behavior During Therapy [facility] for tasks assessed/performed   tearful throughout            Past Medical History:  Diagnosis Date   Anemia    Anxiety    Asthma 02/12/2006   Bronchitis    Chronic kidney disease    "declining kidney, but it's stable right now"   Depression    Diabetes mellitus without complication ([place])    Fibroids    Hypertension 02/13/2011   Started on lisinopril   Pneumonia    Pt reports she had pneumonia at 50 years of age    Past Surgical History:  Procedure Laterality Date   DILATION AND CURETTAGE OF UTERUS  08/2011   HYSTEROSCOPY  02/15/2012   Fibroid Removal   HYSTEROSCOPY  may 2014   ROBOTIC ASSISTED SALPINGO OOPHERECTOMY Bilateral 07/28/2015   Procedure: XI ROBOTIC ASSISTED BILATERAL SALPINGO OOPHORECTOMY AND LYSIS OF ADHESIONS;  Surgeon: Everitt Amber, MD;  Location: [facility];  Service: Gynecology;  Laterality: Bilateral;    SUPRACERVICAL ABDOMINAL HYSTERECTOMY N/A 08/31/2014   Procedure: HYSTERECTOMY SUPRACERVICAL ABDOMINAL;  Surgeon: [name], MD;  Location: [facility];  Service: Gynecology;  Laterality: N/A;  Dr. Elonda Husky will assist   THORACIC DISCECTOMY Left 09/13/2020   Procedure: Left Thoracic Eight-Nine, Thoracic Ten-Eleven Microdiscectomy;  Surgeon: Earnie Larsson, MD;  Location: [place];  Service: Neurosurgery;  Laterality: Left;    There were no vitals filed for this visit.   Subjective Assessment - 01/24/21 0901     Subjective Pt reports continued LBP and intermittent LLE pain. Primarily using rollator and long-handled implements for picking up items from ground    Limitations Standing;Walking;Sitting;Lifting;House hold activities    How long can you sit comfortably? "not very long"    How long can you stand comfortably? 3-5 min    How long can you walk comfortably? 70-140 ft    Patient Stated Goals "Be normal, walk without AD."    Currently in Pain? Yes    Pain Score 5     Pain Location Back    Pain Type Chronic pain    Pain Onset More than a month ago                [facility] PT Assessment - 01/24/21 0001       Assessment   Medical Diagnosis herniated nucleus pulposus w/myelopathy, thoracic    Referring Provider (  PT) Earnie Larsson, MD    Onset Date/Surgical Date 09/13/20                           [facility] Adult PT Treatment/Exercise - 01/24/21 0001       Ambulation/Gait   Ambulation/Gait Yes    Ambulation/Gait Assistance 6: Modified independent (Device/Increase time)    Ambulation Distance (Feet) 200 Feet    Assistive device 4-wheeled walker    Gait Pattern Step-through pattern    Ambulation Surface Level;Indoor    Gait velocity increased    Stairs Yes    Stairs Assistance 6: Modified independent (Device/Increase time)    Stair Management Technique Two rails;Step to pattern    Number of Stairs 8    Curb 6: Modified independent (Device/increase time)      Lumbar  Exercises: Standing   Other Standing Lumbar Exercises paloff press 1x15 reps green band      Lumbar Exercises: Seated   Hip Flexion on Ball Strengthening;Both;20 reps    Hip Flexion on Ball Limitations on swiss ball    Other Seated Lumbar Exercises seated rows, shoulder extension sitting swiss ball 2x15      Lumbar Exercises: Supine   Ab Set 15 reps;2 seconds    Bridge 15 reps    Isometric Hip Flexion 10 reps;2 seconds                       PT Short Term Goals - 01/24/21 0919       PT SHORT TERM GOAL #1   Title Patient will be independent with HEP in order to improve functional outcomes    Baseline independent with seated, standing, supine PRE    Time 3    Period Weeks    Status Achieved    Target Date 01/04/21      PT SHORT TERM GOAL #2   Title Patient will report at least 25% improvement in symptoms for improved quality of life.    Baseline 25% improvement    Time 3    Period Weeks    Status Achieved    Target Date 11/25/20      PT SHORT TERM GOAL #3   Title Demonstrate trunk flexion WFL to improve mobility    Baseline able to perform some spine flexion and reach to 12" from ground    Time 3    Period Weeks    Status Achieved    Target Date 11/25/20               PT Long Term Goals - 01/24/21 0937       PT LONG TERM GOAL #1   Title Patient will be able to complete 5x STS in under 11.4 seconds in order to reduce the risk of falls.    Baseline 31 sec from 18" seat height    Time 6    Period Weeks    Status Not Met      PT LONG TERM GOAL #2   Title Patient will be able to ambulate at least 200 feet in 2MWT in order to demonstrate improved gait speed for community ambulation.    Baseline 210 ft with rollator    Time 6    Period Weeks    Status Achieved                   Plan - 01/24/21 0930     Clinical Impression Statement Pt demonstrates  mastery of HEP for core and LE strengthening. Continues to ambulate with rollator and  decreased velocity but overall good stability and pt reports no LOB/falls since time of surgery.  Overall, demonstrates functional improvements with ability to perform functional mobility for longer periods of time since [facility] and reports ability to perform some household tasks such as sweeping and other light upkeep that she was previously unable to do. Continues to ambulate with slow but steady pattern using rollator with some stiffness to her gait and some knee buckling LLE with loading response which she attributes to joint stiffness and cold weather.  Pt has progressed well with HEP demonstrating independence with home-based strengthening/ROM activities for BLE and core.  Pt will D/C to HEP for continued performance    Personal Factors and Comorbidities Behavior Pattern;Time since onset of injury/illness/exacerbation;Past/Current Experience    Examination-Activity Limitations Bed Mobility;Bend;Carry;Lift;Stand;Stairs;Squat;Reach Overhead;Locomotion Level;Transfers    Examination-Participation Restrictions Church;Cleaning;Community Activity;Laundry;Yard Work;Shop;Occupation;Meal Prep    Stability/Clinical Decision Making Stable/Uncomplicated    Rehab Potential Fair    PT Frequency 1x / week    PT Duration 6 weeks    PT Treatment/Interventions ADLs/Self Care Home Management;Electrical Stimulation;DME Instruction;Ultrasound;Traction;Gait training;Stair training;Functional mobility training;Therapeutic activities;Therapeutic exercise;Balance training;Patient/family education;Neuromuscular re-education;Manual techniques;Dry needling;Spinal Manipulations;Joint Manipulations    PT Next Visit Plan D/C to HEP    PT Home Exercise Plan standing hip abduction, extension with countertop support; 9/26 seated heel/toe raise, STS, march, LAQ, standing heel/toe raise, march; 10/13 seated hip abd/add, YTB given    Consulted and Agree with Plan of Care Patient             Patient will benefit from skilled  therapeutic intervention in order to improve the following deficits and impairments:  Decreased activity tolerance, Decreased balance, Decreased mobility, Decreased range of motion, Decreased strength, Difficulty walking, Impaired perceived functional ability, Postural dysfunction, Improper body mechanics, Pain  Visit Diagnosis: Acute low back pain, unspecified back pain laterality, unspecified whether sciatica present  Muscle weakness (generalized)  Difficulty in walking, not elsewhere classified  Unsteadiness on feet     Problem List Patient Active Problem List   Diagnosis Date Noted   Herniation of intervertebral disc of thoracic spine with myelopathy 09/13/2020   Mild intermittent asthma without complication 07/62/2633   Right thigh pain 03/31/2018   Leukocytosis 02/07/2018   Chronic pain of both knees 01/31/2018   Pain in both lower extremities 01/13/2018   Tachycardia 01/13/2018   Chronic right shoulder pain 10/18/2017   Right hand pain 10/18/2017   Hyperlipidemia 10/11/2017   Type 2 diabetes mellitus with complication, without long-term current use of insulin ([place]) 10/11/2017   Rash 10/25/2016   Hormone replacement therapy (HRT) 09/24/2016   Screening for breast cancer 04/25/2016   Chronic low back pain 04/25/2016   Pain of right thumb 12/16/2015   Cough 12/16/2015   Endometriosis of rectovaginal septum 06/14/2015   Endometriosis 02/16/2015   Endometriosis of pelvis 10/12/2014   Uterine fibroid 12/28/2013   Obesity (BMI 30-39.9) 11/30/2013   Hypertension 03/26/2012   Anxiety and depression 03/26/2012   Routine general medical examination at a health care facility 03/26/2012    Toniann Fail, PT 01/24/2021, 9:57 AM  [facility] [address] [place], [place], [address] Phone: [phone]   Fax:  [phone]  Name: Sandra Gray MRN: [id number] Date of Birth: Jun 27, 1970

## 2021-03-01 DIAGNOSIS — M5104 Intervertebral disc disorders with myelopathy, thoracic region: Secondary | ICD-10-CM | POA: Diagnosis not present

## 2021-03-14 ENCOUNTER — Ambulatory Visit (HOSPITAL_COMMUNITY): Payer: Medicaid Other | Attending: Neurosurgery | Admitting: Physical Therapy

## 2021-03-14 ENCOUNTER — Other Ambulatory Visit: Payer: Self-pay

## 2021-03-14 DIAGNOSIS — E119 Type 2 diabetes mellitus without complications: Secondary | ICD-10-CM | POA: Diagnosis not present

## 2021-03-14 DIAGNOSIS — R29898 Other symptoms and signs involving the musculoskeletal system: Secondary | ICD-10-CM | POA: Diagnosis present

## 2021-03-14 DIAGNOSIS — M5104 Intervertebral disc disorders with myelopathy, thoracic region: Secondary | ICD-10-CM | POA: Diagnosis present

## 2021-03-14 DIAGNOSIS — M546 Pain in thoracic spine: Secondary | ICD-10-CM | POA: Insufficient documentation

## 2021-03-14 DIAGNOSIS — I10 Essential (primary) hypertension: Secondary | ICD-10-CM | POA: Diagnosis not present

## 2021-03-14 DIAGNOSIS — R262 Difficulty in walking, not elsewhere classified: Secondary | ICD-10-CM | POA: Insufficient documentation

## 2021-03-14 DIAGNOSIS — E785 Hyperlipidemia, unspecified: Secondary | ICD-10-CM | POA: Diagnosis not present
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
## 2021-03-14 NOTE — Patient Instructions (Signed)
Date: 03/14/2021 Prepared by: Adalberto Cole  Exercises Hooklying Single Knee to Chest - 2-3 x daily - 7 x weekly - 5 reps - 10s hold Supine Lower Trunk Rotation - 2-3 x daily - 7 x weekly - 14 reps - 2-3s hold Cat Cow - 2-3 x daily - 7 x weekly - 2 sets - 10 reps

## 2021-03-21 DIAGNOSIS — G4700 Insomnia, unspecified: Secondary | ICD-10-CM | POA: Diagnosis not present

## 2021-03-21 DIAGNOSIS — F322 Major depressive disorder, single episode, severe without psychotic features: Secondary | ICD-10-CM | POA: Diagnosis not present

## 2021-03-21 DIAGNOSIS — Z6841 Body Mass Index (BMI) 40.0 and over, adult: Secondary | ICD-10-CM | POA: Diagnosis not present

## 2021-03-21 DIAGNOSIS — Z91199 Patient's noncompliance with other medical treatment and regimen due to unspecified reason: Secondary | ICD-10-CM | POA: Diagnosis not present

## 2021-03-21 DIAGNOSIS — E119 Type 2 diabetes mellitus without complications: Secondary | ICD-10-CM | POA: Diagnosis not present

## 2021-03-21 DIAGNOSIS — E785 Hyperlipidemia, unspecified: Secondary | ICD-10-CM | POA: Diagnosis not present

## 2021-03-21 DIAGNOSIS — Z7984 Long term (current) use of oral hypoglycemic drugs: Secondary | ICD-10-CM | POA: Diagnosis not present

## 2021-03-21 DIAGNOSIS — I10 Essential (primary) hypertension: Secondary | ICD-10-CM | POA: Diagnosis not present

## 2021-03-21 DIAGNOSIS — G8929 Other chronic pain: Secondary | ICD-10-CM | POA: Diagnosis not present

## 2021-03-21 DIAGNOSIS — Z79899 Other long term (current) drug therapy: Secondary | ICD-10-CM | POA: Diagnosis not present

## 2021-03-22 ENCOUNTER — Encounter (HOSPITAL_COMMUNITY): Payer: Self-pay

## 2021-03-22 ENCOUNTER — Other Ambulatory Visit: Payer: Self-pay

## 2021-03-22 ENCOUNTER — Ambulatory Visit (HOSPITAL_COMMUNITY): Payer: Medicaid Other | Attending: Neurosurgery

## 2021-03-22 DIAGNOSIS — R29898 Other symptoms and signs involving the musculoskeletal system: Secondary | ICD-10-CM | POA: Diagnosis present

## 2021-03-22 DIAGNOSIS — M546 Pain in thoracic spine: Secondary | ICD-10-CM | POA: Diagnosis present

## 2021-03-22 DIAGNOSIS — R262 Difficulty in walking, not elsewhere classified: Secondary | ICD-10-CM | POA: Diagnosis present

## 2021-03-22 DIAGNOSIS — M5104 Intervertebral disc disorders with myelopathy, thoracic region: Secondary | ICD-10-CM | POA: Insufficient documentation

## 2021-03-22 NOTE — Therapy (Signed)
[employer] [place], [place], [address] Phone: [phone]   Fax:  [phone]  Physical Therapy Treatment  Patient Details  Name: [place] MRN: [id number] Date of Birth: 08-05-1970 Referring Provider (PT): Earnie Larsson, MD   Encounter Date: 03/22/2021   PT End of Session - 03/22/21 0848     Visit Number 2    Number of Visits 8    Date for PT Re-Evaluation 05/09/21    Authorization Type [place] Medicaid UHC    Authorization Time Period No Auth Req(27 visits between PT/OT/ST)    Authorization - Visit Number 2    Authorization - Number of Visits 27    Progress Note Due on Visit 10    PT Start Time 0829    PT Stop Time 0913    PT Time Calculation (min) 44 min    Activity Tolerance Patient limited by pain    Behavior During Therapy Restless;Agitated             Past Medical History:  Diagnosis Date   Anemia    Anxiety    Asthma 02/12/2006   Bronchitis    Chronic kidney disease    "declining kidney, but it's stable right now"   Depression    Diabetes mellitus without complication ([place])    Fibroids    Hypertension 02/13/2011   Started on lisinopril   Pneumonia    Pt reports she had pneumonia at 51 years of age    Past Surgical History:  Procedure Laterality Date   DILATION AND CURETTAGE OF UTERUS  08/2011   HYSTEROSCOPY  02/15/2012   Fibroid Removal   HYSTEROSCOPY  may 2014   ROBOTIC ASSISTED SALPINGO OOPHERECTOMY Bilateral 07/28/2015   Procedure: XI ROBOTIC ASSISTED BILATERAL SALPINGO OOPHORECTOMY AND LYSIS OF ADHESIONS;  Surgeon: Everitt Amber, MD;  Location: [facility];  Service: Gynecology;  Laterality: Bilateral;   SUPRACERVICAL ABDOMINAL HYSTERECTOMY N/A 08/31/2014   Procedure: HYSTERECTOMY SUPRACERVICAL ABDOMINAL;  Surgeon: [name], MD;  Location: [facility];  Service: Gynecology;  Laterality: N/A;  Dr. Elonda Husky will assist   THORACIC DISCECTOMY Left 09/13/2020   Procedure: Left Thoracic Eight-Nine, Thoracic  Ten-Eleven Microdiscectomy;  Surgeon: Earnie Larsson, MD;  Location: [facility];  Service: Neurosurgery;  Laterality: Left;    There were no vitals filed for this visit.   Subjective Assessment - 03/22/21 0837     Subjective Pt reports she has began the HEP without questions.  Pt c/o LBP and Lt LE to knee achey pain, pain scale 7/10.    Pertinent History Left Microdiscectomy - left - T8-T9 - T10-T11 (performed 09/13/2020)    Diagnostic tests MRI performed on 09/19/2019 indicated, "Thoracic spondylosis as outlined and most notably as follows.     At T8-T9, there is a sizable left center/foraminal disc extrusion.  Resultant severe spinal canal stenosis with rightward displacement  of the spinal cord and significant spinal cord impingement. T2  hyperintense signal abnormality within the spinal cord at this level  which may reflect focal edema or myelomalacia. The disc herniation  also contributes to moderate left neural foraminal narrowing.     Smaller disc herniations are present at the T5-T6, T6-T7, T9-T10 and  T10-T11 levels. Some of these herniations contact the ventral spinal  cord, but there is no more than mild relative central canal stenosis  at these levels.     Also of note, there is moderate disc degeneration at the T6-T7  through T11-T12 levels.  Incompletely imaged dorsal subcutaneous edema overlying the lower  thoracic and visualized upper lumbar spine."    Patient Stated Goals "Be normal, walk without AD."    Currently in Pain? Yes    Pain Score 7     Pain Location Back    Pain Orientation Lower;Left    Pain Descriptors / Indicators Aching    Pain Type Chronic pain    Pain Onset More than a month ago    Pain Frequency Constant    Aggravating Factors  Prolonged positioning    Pain Relieving Factors movement    Effect of Pain on Daily Activities limits all activity/ participation                               [facility] Adult PT Treatment/Exercise - 03/22/21 0001        Lumbar Exercises: Stretches   Single Knee to Chest Stretch 3 reps;30 seconds    Single Knee to Chest Stretch Limitations towel assistance    Lower Trunk Rotation 5 reps;10 seconds    Standing Extension 2 reps;5 seconds      Lumbar Exercises: Standing   Other Standing Lumbar Exercises 3D hip excursion 10x (STS instead of squat) pain free range      Lumbar Exercises: Supine   Bridge 5 seconds;5 reps    Bridge Limitations 3 sets      Lumbar Exercises: Sidelying   Clam 10 reps;5 seconds                     PT Education - 03/22/21 0846     Education Details Reviewed goals, educated importance of HEP complaince for maximal benefits, pt able to recall and demonstrate appropriate exercises, encouraged to hold stretches for increased time for musculature lengthening.    Person(s) Educated Patient    Methods Explanation;Demonstration    Comprehension Verbalized understanding;Returned demonstration              PT Short Term Goals - 03/14/21 1547       PT SHORT TERM GOAL #1   Title Patient will be able to complete 5xSTS in <22s    Time 4    Period Weeks    Status New    Target Date 04/11/21      PT SHORT TERM GOAL #2   Title Patient will achieve a bilateral hip flexion MMT of at least 4/5 and without onset of spine pain greater than a 3/10.    Time 4    Period Weeks    Status New    Target Date 04/11/21      PT SHORT TERM GOAL #3   Title Patient will be able to ambulate at least 23ft with an AD before an onset of spine pain.    Baseline 153ft    Time 4    Period Weeks    Status New    Target Date 04/11/21               PT Long Term Goals - 03/14/21 1550       PT LONG TERM GOAL #1   Title Patient will be able to complete 5xSTS in <13s    Time 8    Period Weeks    Status New    Target Date 05/09/21      PT LONG TERM GOAL #2   Title Patient will achieve a bilateral hip flexion MMT of at least 4+/5  and without onset of spine pain greater than a  1/10.    Time 8    Period Weeks    Status New    Target Date 05/09/21      PT LONG TERM GOAL #3   Title Patient will be able to ambulate at least 525ft without the use of an AD or onset of spine pain greater 1/10.    Time 8    Period Weeks    Status New    Target Date 05/09/21                   Plan - 03/22/21 0901     Clinical Impression Statement Reviewed goals, educated importance of HEP compliance for maximal benefits, pt able to recall and perform appropraite mechanics with current exercise program.  Session focus with spinal mobility and proximal strengthening.  Added bridge and clam to HEP for gluteal strengthening.  Pt limited by pain and guarded through session, monitored tolerance through session.    Personal Factors and Comorbidities Behavior Pattern;Time since onset of injury/illness/exacerbation;Past/Current Experience    Examination-Activity Limitations Bed Mobility;Bend;Carry;Lift;Stand;Stairs;Squat;Reach Overhead;Locomotion Level;Transfers    Examination-Participation Restrictions Church;Cleaning;Community Activity;Laundry;Yard Work;Shop;Occupation;Meal Prep    Stability/Clinical Decision Making Evolving/Moderate complexity    Clinical Decision Making Moderate    Rehab Potential Fair    PT Frequency 1x / week    PT Duration 8 weeks    PT Treatment/Interventions ADLs/Self Care Home Management;Electrical Stimulation;DME Instruction;Ultrasound;Traction;Gait training;Stair training;Functional mobility training;Therapeutic activities;Therapeutic exercise;Balance training;Patient/family education;Neuromuscular re-education;Manual techniques;Dry needling;Spinal Manipulations;Joint Manipulations    PT Next Visit Plan Continue with very light spinal mobility program and add exercises as tolerated.    PT Home Exercise Plan Eval: SKTC, LTR, cat/cow; 2/8: bridge, clam    Consulted and Agree with Plan of Care Patient             Patient will benefit from skilled  therapeutic intervention in order to improve the following deficits and impairments:  Decreased activity tolerance, Decreased balance, Decreased mobility, Decreased range of motion, Decreased strength, Difficulty walking, Impaired perceived functional ability, Postural dysfunction, Improper body mechanics, Pain, Abnormal gait  Visit Diagnosis: Herniated nucleus pulposus with myelopathy, thoracic  Pain in thoracic spine  Decreased muscle function  Difficulty in walking, not elsewhere classified     Problem List Patient Active Problem List   Diagnosis Date Noted   Herniation of intervertebral disc of thoracic spine with myelopathy 09/13/2020   Mild intermittent asthma without complication 00/93/8182   Right thigh pain 03/31/2018   Leukocytosis 02/07/2018   Chronic pain of both knees 01/31/2018   Pain in both lower extremities 01/13/2018   Tachycardia 01/13/2018   Chronic right shoulder pain 10/18/2017   Right hand pain 10/18/2017   Hyperlipidemia 10/11/2017   Type 2 diabetes mellitus with complication, without long-term current use of insulin ([place]) 10/11/2017   Rash 10/25/2016   Hormone replacement therapy (HRT) 09/24/2016   Screening for breast cancer 04/25/2016   Chronic low back pain 04/25/2016   Pain of right thumb 12/16/2015   Cough 12/16/2015   Endometriosis of rectovaginal septum 06/14/2015   Endometriosis 02/16/2015   Endometriosis of pelvis 10/12/2014   Uterine fibroid 12/28/2013   Obesity (BMI 30-39.9) 11/30/2013   Hypertension 03/26/2012   Anxiety and depression 03/26/2012   Routine general medical examination at a health care facility 03/26/2012   Ihor Austin, LPTA/CLT; CBIS [phone]  Aldona Lento, PTA 03/22/2021, 11:03 AM  [facility] [address]  [place], [place], [address] Phone: [phone]   Fax:  [phone]  Name: Meiya Wisler MRN: [id number] Date of Birth: 1970-05-14

## 2021-03-29 ENCOUNTER — Ambulatory Visit (HOSPITAL_COMMUNITY): Payer: Medicaid Other | Admitting: Physical Therapy

## 2021-04-05 ENCOUNTER — Encounter (HOSPITAL_COMMUNITY): Payer: Self-pay

## 2021-04-05 ENCOUNTER — Other Ambulatory Visit: Payer: Self-pay

## 2021-04-05 ENCOUNTER — Ambulatory Visit (HOSPITAL_COMMUNITY): Payer: Medicaid Other

## 2021-04-05 DIAGNOSIS — M546 Pain in thoracic spine: Secondary | ICD-10-CM

## 2021-04-05 DIAGNOSIS — M5104 Intervertebral disc disorders with myelopathy, thoracic region: Secondary | ICD-10-CM

## 2021-04-05 DIAGNOSIS — R29898 Other symptoms and signs involving the musculoskeletal system: Secondary | ICD-10-CM

## 2021-04-05 DIAGNOSIS — R262 Difficulty in walking, not elsewhere classified: Secondary | ICD-10-CM

## 2021-04-05 NOTE — Therapy (Signed)
[facility] [place], [place], [address] Phone: [phone]   Fax:  [phone]  Physical Therapy Treatment  Patient Details  Name: Sandra [place] MRN: [id number] Date of Birth: March 14, 1970 Referring Provider (PT): Earnie Larsson, MD   Encounter Date: 04/05/2021   PT End of Session - 04/05/21 1807     Visit Number 3    Number of Visits 8    Date for PT Re-Evaluation 05/09/21    Authorization Type [place] Medicaid UHC    Authorization Time Period No Auth Req(27 visits between PT/OT/ST)    Authorization - Visit Number 3    Authorization - Number of Visits 27    Progress Note Due on Visit 10    PT Start Time 1704    PT Stop Time 7619    PT Time Calculation (min) 41 min    Activity Tolerance Patient limited by pain;Patient tolerated treatment well    Behavior During Therapy [facility] for tasks assessed/performed;Restless             Past Medical History:  Diagnosis Date   Anemia    Anxiety    Asthma 02/12/2006   Bronchitis    Chronic kidney disease    "declining kidney, but it's stable right now"   Depression    Diabetes mellitus without complication ([place])    Fibroids    Hypertension 02/13/2011   Started on lisinopril   Pneumonia    Pt reports she had pneumonia at 51 years of age    Past Surgical History:  Procedure Laterality Date   DILATION AND CURETTAGE OF UTERUS  08/2011   HYSTEROSCOPY  02/15/2012   Fibroid Removal   HYSTEROSCOPY  may 2014   ROBOTIC ASSISTED SALPINGO OOPHERECTOMY Bilateral 07/28/2015   Procedure: XI ROBOTIC ASSISTED BILATERAL SALPINGO OOPHORECTOMY AND LYSIS OF ADHESIONS;  Surgeon: Everitt Amber, MD;  Location: [facility];  Service: Gynecology;  Laterality: Bilateral;   SUPRACERVICAL ABDOMINAL HYSTERECTOMY N/A 08/31/2014   Procedure: HYSTERECTOMY SUPRACERVICAL ABDOMINAL;  Surgeon: [name], MD;  Location: [facility];  Service: Gynecology;  Laterality: N/A;  Dr. Elonda Husky will assist   THORACIC DISCECTOMY Left  09/13/2020   Procedure: Left Thoracic Eight-Nine, Thoracic Ten-Eleven Microdiscectomy;  Surgeon: Earnie Larsson, MD;  Location: [facility];  Service: Neurosurgery;  Laterality: Left;    There were no vitals filed for this visit.   Subjective Assessment - 04/05/21 1713     Subjective Reports pain comes and goes, stated pain is controlled today with pain meds and patches    Pertinent History Left Microdiscectomy - left - T8-T9 - T10-T11 (performed 09/13/2020)    Diagnostic tests MRI performed on 09/19/2019 indicated, "Thoracic spondylosis as outlined and most notably as follows.     At T8-T9, there is a sizable left center/foraminal disc extrusion.  Resultant severe spinal canal stenosis with rightward displacement  of the spinal cord and significant spinal cord impingement. T2  hyperintense signal abnormality within the spinal cord at this level  which may reflect focal edema or myelomalacia. The disc herniation  also contributes to moderate left neural foraminal narrowing.     Smaller disc herniations are present at the T5-T6, T6-T7, T9-T10 and  T10-T11 levels. Some of these herniations contact the ventral spinal  cord, but there is no more than mild relative central canal stenosis  at these levels.     Also of note, there is moderate disc degeneration at the T6-T7  through T11-T12 levels.     Incompletely  imaged dorsal subcutaneous edema overlying the lower  thoracic and visualized upper lumbar spine."    Patient Stated Goals "Be normal, walk without AD."    Currently in Pain? Yes    Pain Score 5     Pain Location Back    Pain Orientation Mid;Left    Pain Descriptors / Indicators Aching    Pain Type Chronic pain    Pain Onset More than a month ago    Pain Frequency Constant    Aggravating Factors  prolonged positioning    Pain Relieving Factors movement    Effect of Pain on Daily Activities limits all activity/participation                               [facility] Adult PT  Treatment/Exercise - 04/05/21 0001       Exercises   Exercises Lumbar      Lumbar Exercises: Stretches   Prone on Elbows Stretch Limitations 2 min      Lumbar Exercises: Standing   Functional Squats 10 reps    Functional Squats Limitations 3D hip excursion, front of chair for squat mechanics      Lumbar Exercises: Seated   Other Seated Lumbar Exercises 3D thoracic excursion 5x each    Other Seated Lumbar Exercises scapular retraction 10x 5"      Lumbar Exercises: Sidelying   Hip Abduction 10 reps      Lumbar Exercises: Prone   Straight Leg Raise 10 reps                       PT Short Term Goals - 03/14/21 1547       PT SHORT TERM GOAL #1   Title Patient will be able to complete 5xSTS in <22s    Time 4    Period Weeks    Status New    Target Date 04/11/21      PT SHORT TERM GOAL #2   Title Patient will achieve a bilateral hip flexion MMT of at least 4/5 and without onset of spine pain greater than a 3/10.    Time 4    Period Weeks    Status New    Target Date 04/11/21      PT SHORT TERM GOAL #3   Title Patient will be able to ambulate at least 289ft with an AD before an onset of spine pain.    Baseline 157ft    Time 4    Period Weeks    Status New    Target Date 04/11/21               PT Long Term Goals - 03/14/21 1550       PT LONG TERM GOAL #1   Title Patient will be able to complete 5xSTS in <13s    Time 8    Period Weeks    Status New    Target Date 05/09/21      PT LONG TERM GOAL #2   Title Patient will achieve a bilateral hip flexion MMT of at least 4+/5 and without onset of spine pain greater than a 1/10.    Time 8    Period Weeks    Status New    Target Date 05/09/21      PT LONG TERM GOAL #3   Title Patient will be able to ambulate at least 543ft without the use of an AD or onset  of spine pain greater 1/10.    Time 8    Period Weeks    Status New    Target Date 05/09/21                   Plan - 04/05/21  1801     Clinical Impression Statement Session focus with spinal mobility and gluteal strengthening.  Added thoracic mobility and postural strengthening as well progressed gluteal strengthening wiht good response.  Added 3D hip excursion, prone and sidelying leg raise to HEP.  Pt limited by pain and guarded thorugh session though improved guarded compared to last session.    Personal Factors and Comorbidities Behavior Pattern;Time since onset of injury/illness/exacerbation;Past/Current Experience    Examination-Activity Limitations Bed Mobility;Bend;Carry;Lift;Stand;Stairs;Squat;Reach Overhead;Locomotion Level;Transfers    Examination-Participation Restrictions Church;Cleaning;Community Activity;Laundry;Yard Work;Shop;Occupation;Meal Prep    Stability/Clinical Decision Making Evolving/Moderate complexity    Clinical Decision Making Moderate    Rehab Potential Fair    PT Frequency 1x / week    PT Duration 8 weeks    PT Treatment/Interventions ADLs/Self Care Home Management;Electrical Stimulation;DME Instruction;Ultrasound;Traction;Gait training;Stair training;Functional mobility training;Therapeutic activities;Therapeutic exercise;Balance training;Patient/family education;Neuromuscular re-education;Manual techniques;Dry needling;Spinal Manipulations;Joint Manipulations    PT Next Visit Plan Continue with very light spinal mobility program and add exercises as tolerated.    PT Home Exercise Plan Eval: SKTC, LTR, cat/cow; 2/8: bridge, clam; 2/22: 3D hip excursin, prone SLR and sidelying abd    Consulted and Agree with Plan of Care Patient             Patient will benefit from skilled therapeutic intervention in order to improve the following deficits and impairments:  Decreased activity tolerance, Decreased balance, Decreased mobility, Decreased range of motion, Decreased strength, Difficulty walking, Impaired perceived functional ability, Postural dysfunction, Improper body mechanics, Pain,  Abnormal gait  Visit Diagnosis: Herniated nucleus pulposus with myelopathy, thoracic  Pain in thoracic spine  Decreased muscle function  Difficulty in walking, not elsewhere classified     Problem List Patient Active Problem List   Diagnosis Date Noted   Herniation of intervertebral disc of thoracic spine with myelopathy 09/13/2020   Mild intermittent asthma without complication 50/53/9767   Right thigh pain 03/31/2018   Leukocytosis 02/07/2018   Chronic pain of both knees 01/31/2018   Pain in both lower extremities 01/13/2018   Tachycardia 01/13/2018   Chronic right shoulder pain 10/18/2017   Right hand pain 10/18/2017   Hyperlipidemia 10/11/2017   Type 2 diabetes mellitus with complication, without long-term current use of insulin ([place]) 10/11/2017   Rash 10/25/2016   Hormone replacement therapy (HRT) 09/24/2016   Screening for breast cancer 04/25/2016   Chronic low back pain 04/25/2016   Pain of right thumb 12/16/2015   Cough 12/16/2015   Endometriosis of rectovaginal septum 06/14/2015   Endometriosis 02/16/2015   Endometriosis of pelvis 10/12/2014   Uterine fibroid 12/28/2013   Obesity (BMI 30-39.9) 11/30/2013   Hypertension 03/26/2012   Anxiety and depression 03/26/2012   Routine general medical examination at a health care facility 03/26/2012   Ihor Austin, LPTA/CLT; CBIS [phone]  Aldona Lento, PTA 04/05/2021, 6:47 PM  [place], [place], [address] Phone: [phone]   Fax:  [phone]  Name: [place] MRN: [id number] Date of Birth: 1970/03/03

## 2021-04-12 ENCOUNTER — Encounter (HOSPITAL_COMMUNITY): Payer: Self-pay

## 2021-04-12 ENCOUNTER — Ambulatory Visit (HOSPITAL_COMMUNITY): Payer: Medicaid Other | Attending: Neurosurgery

## 2021-04-12 ENCOUNTER — Other Ambulatory Visit: Payer: Self-pay

## 2021-04-12 DIAGNOSIS — M546 Pain in thoracic spine: Secondary | ICD-10-CM | POA: Diagnosis present

## 2021-04-12 DIAGNOSIS — R262 Difficulty in walking, not elsewhere classified: Secondary | ICD-10-CM | POA: Diagnosis present

## 2021-04-12 DIAGNOSIS — M6281 Muscle weakness (generalized): Secondary | ICD-10-CM | POA: Insufficient documentation

## 2021-04-12 DIAGNOSIS — M545 Low back pain, unspecified: Secondary | ICD-10-CM | POA: Insufficient documentation

## 2021-04-12 DIAGNOSIS — R2681 Unsteadiness on feet: Secondary | ICD-10-CM | POA: Insufficient documentation

## 2021-04-12 DIAGNOSIS — R29898 Other symptoms and signs involving the musculoskeletal system: Secondary | ICD-10-CM | POA: Diagnosis present

## 2021-04-12 DIAGNOSIS — M5104 Intervertebral disc disorders with myelopathy, thoracic region: Secondary | ICD-10-CM | POA: Insufficient documentation

## 2021-04-12 NOTE — Therapy (Signed)
[place], [place], [address] Phone: [phone]   Fax:  [phone]  Physical Therapy Treatment  Patient Details  Name: Sandra Gray MRN: [id number] Date of Birth: October 17, 1970 Referring Provider (PT): Earnie Larsson, MD   Encounter Date: 04/12/2021   PT End of Session - 04/12/21 1628     Visit Number 4    Number of Visits 8    Date for PT Re-Evaluation 05/09/21    Authorization Type [place] Medicaid UHC    Authorization Time Period No Auth Req(27 visits between PT/OT/ST)    Authorization - Visit Number 4    Authorization - Number of Visits 27    Progress Note Due on Visit 10    PT Start Time 1620   late sign in   PT Stop Time 1700    PT Time Calculation (min) 40 min    Activity Tolerance Patient limited by pain;Patient tolerated treatment well;No increased pain    Behavior During Therapy [facility] for tasks assessed/performed             Past Medical History:  Diagnosis Date   Anemia    Anxiety    Asthma 02/12/2006   Bronchitis    Chronic kidney disease    "declining kidney, but it's stable right now"   Depression    Diabetes mellitus without complication ([place])    Fibroids    Hypertension 02/13/2011   Started on lisinopril   Pneumonia    Pt reports she had pneumonia at 51 years of age    Past Surgical History:  Procedure Laterality Date   DILATION AND CURETTAGE OF UTERUS  08/2011   HYSTEROSCOPY  02/15/2012   Fibroid Removal   HYSTEROSCOPY  may 2014   ROBOTIC ASSISTED SALPINGO OOPHERECTOMY Bilateral 07/28/2015   Procedure: XI ROBOTIC ASSISTED BILATERAL SALPINGO OOPHORECTOMY AND LYSIS OF ADHESIONS;  Surgeon: Everitt Amber, MD;  Location: [facility];  Service: Gynecology;  Laterality: Bilateral;   SUPRACERVICAL ABDOMINAL HYSTERECTOMY N/A 08/31/2014   Procedure: HYSTERECTOMY SUPRACERVICAL ABDOMINAL;  Surgeon: [name], MD;  Location: [facility];  Service: Gynecology;  Laterality: N/A;  Dr. Elonda Husky will assist    THORACIC DISCECTOMY Left 09/13/2020   Procedure: Left Thoracic Eight-Nine, Thoracic Ten-Eleven Microdiscectomy;  Surgeon: Earnie Larsson, MD;  Location: [facility];  Service: Neurosurgery;  Laterality: Left;    There were no vitals filed for this visit.   Subjective Assessment - 04/12/21 1625     Subjective Reports pain comes and goes depending on positions.  Reports dull achey pain on Lt side of lower back.    Pertinent History Left Microdiscectomy - left - T8-T9 - T10-T11 (performed 09/13/2020)    Patient Stated Goals "Be normal, walk without AD."    Currently in Pain? Yes    Pain Score 5     Pain Location Back    Pain Orientation Lower;Left    Pain Descriptors / Indicators Aching;Dull    Pain Type Chronic pain    Pain Onset More than a month ago    Pain Frequency Constant    Aggravating Factors  prolonged positoining    Pain Relieving Factors movement    Effect of Pain on Daily Activities limits all activity/ participation                               [facility] Adult PT Treatment/Exercise - 04/12/21 0001       Ambulation/Gait  Ambulation/Gait Assistance 6: Modified independent (Device/Increase time)    Ambulation Distance (Feet) 56 Feet    Assistive device Straight cane    Gait Pattern Step-through pattern    Ambulation Surface Level;Indoor    Gait velocity very slow cadence    Gait Comments 2MWT; took 5 min for 215ft      Lumbar Exercises: Standing   Functional Squats 10 reps    Functional Squats Limitations squat then extend back 3" holds front of chair for mechanics    Forward Lunge 10 reps    Forward Lunge Limitations 8in step with HHA    Row 10 reps;Theraband    Theraband Level (Row) Level 2 (Red)    Row Limitations with ab set    Shoulder Extension 10 reps;Theraband    Theraband Level (Shoulder Extension) Level 2 (Red)    Shoulder Extension Limitations ab set    Other Standing Lumbar Exercises 3D hip excursion with thoracic arm movements 5x    Other  Standing Lumbar Exercises tandem stance 2x 30" 1 set on foam; sidestep 2RT inside // bars                       PT Short Term Goals - 03/14/21 1547       PT SHORT TERM GOAL #1   Title Patient will be able to complete 5xSTS in <22s    Time 4    Period Weeks    Status New    Target Date 04/11/21      PT SHORT TERM GOAL #2   Title Patient will achieve a bilateral hip flexion MMT of at least 4/5 and without onset of spine pain greater than a 3/10.    Time 4    Period Weeks    Status New    Target Date 04/11/21      PT SHORT TERM GOAL #3   Title Patient will be able to ambulate at least 270ft with an AD before an onset of spine pain.    Baseline 130ft    Time 4    Period Weeks    Status New    Target Date 04/11/21               PT Long Term Goals - 03/14/21 1550       PT LONG TERM GOAL #1   Title Patient will be able to complete 5xSTS in <13s    Time 8    Period Weeks    Status New    Target Date 05/09/21      PT LONG TERM GOAL #2   Title Patient will achieve a bilateral hip flexion MMT of at least 4+/5 and without onset of spine pain greater than a 1/10.    Time 8    Period Weeks    Status New    Target Date 05/09/21      PT LONG TERM GOAL #3   Title Patient will be able to ambulate at least 549ft without the use of an AD or onset of spine pain greater 1/10.    Time 8    Period Weeks    Status New    Target Date 05/09/21                   Plan - 04/12/21 1704     Clinical Impression Statement Progressed to standing exercises with focus on spinal mobility and proximal strengthening.  Reviewed pt.'s goals wiht therapy to ambulate wiht LRAD,  gait training with SPC.  Presents with good sequence and no LOB though very slow cadence that does increase risk of fall.  Added balance activities with good static balance, required increased HHA wiht dynamic surface and light touch during sidestep.  Added balance activities to HEP, encouraged to  complete new counter or sink.  Also added postural strneghtneing this session with additional theraband.    Personal Factors and Comorbidities Behavior Pattern;Time since onset of injury/illness/exacerbation;Past/Current Experience    Examination-Activity Limitations Bed Mobility;Bend;Carry;Lift;Stand;Stairs;Squat;Reach Overhead;Locomotion Level;Transfers    Examination-Participation Restrictions Church;Cleaning;Community Activity;Laundry;Yard Work;Shop;Occupation;Meal Prep    Stability/Clinical Decision Making Evolving/Moderate complexity    Clinical Decision Making Moderate    Rehab Potential Fair    PT Frequency 1x / week    PT Duration 8 weeks    PT Treatment/Interventions ADLs/Self Care Home Management;Electrical Stimulation;DME Instruction;Ultrasound;Traction;Gait training;Stair training;Functional mobility training;Therapeutic activities;Therapeutic exercise;Balance training;Patient/family education;Neuromuscular re-education;Manual techniques;Dry needling;Spinal Manipulations;Joint Manipulations    PT Next Visit Plan Begin SLS, vector stance.  Continue with very light spinal mobility program and add exercises as tolerated.    PT Home Exercise Plan Eval: SKTC, LTR, cat/cow; 2/8: bridge, clam; 2/22: 3D hip excursin, prone SLR and sidelying abd; 3/1: tandem stance and side step    Consulted and Agree with Plan of Care Patient             Patient will benefit from skilled therapeutic intervention in order to improve the following deficits and impairments:  Decreased activity tolerance, Decreased balance, Decreased mobility, Decreased range of motion, Decreased strength, Difficulty walking, Impaired perceived functional ability, Postural dysfunction, Improper body mechanics, Pain, Abnormal gait  Visit Diagnosis: Herniated nucleus pulposus with myelopathy, thoracic  Pain in thoracic spine  Decreased muscle function  Difficulty in walking, not elsewhere classified     Problem  List Patient Active Problem List   Diagnosis Date Noted   Herniation of intervertebral disc of thoracic spine with myelopathy 09/13/2020   Mild intermittent asthma without complication 97/58/8325   Right thigh pain 03/31/2018   Leukocytosis 02/07/2018   Chronic pain of both knees 01/31/2018   Pain in both lower extremities 01/13/2018   Tachycardia 01/13/2018   Chronic right shoulder pain 10/18/2017   Right hand pain 10/18/2017   Hyperlipidemia 10/11/2017   Type 2 diabetes mellitus with complication, without long-term current use of insulin ([place]) 10/11/2017   Rash 10/25/2016   Hormone replacement therapy (HRT) 09/24/2016   Screening for breast cancer 04/25/2016   Chronic low back pain 04/25/2016   Pain of right thumb 12/16/2015   Cough 12/16/2015   Endometriosis of rectovaginal septum 06/14/2015   Endometriosis 02/16/2015   Endometriosis of pelvis 10/12/2014   Uterine fibroid 12/28/2013   Obesity (BMI 30-39.9) 11/30/2013   Hypertension 03/26/2012   Anxiety and depression 03/26/2012   Routine general medical examination at a health care facility 03/26/2012   Ihor Austin, LPTA/CLT; CBIS [id number]  Aldona Lento, PTA 04/12/2021, 5:46 PM  [facility] [address] [place], [place], [address] Phone: [phone]   Fax:  [phone]  Name: Sandra Gray MRN: [id number] Date of Birth: 1970-07-18

## 2021-04-12 NOTE — Patient Instructions (Signed)
Tandem Stance ? ? ? ?Standing beside sink or counter for safety.   ?Right foot in front of left, heel touching toe both feet "straight ahead". Stand on Foot Triangle of Support with both feet.  ?Balance in this position 30 seconds. ?Do with left foot in front of right. ? ?Copyright ? VHI. All rights reserved.  ? ?AMBULATION: Side Step ? ? ? ?Step sideways. Repeat in opposite direction. ?4 days per week  ?Use assistive device. Walk to tempo of metronome or music. ? ?Copyright ? VHI. All rights reserved.  ? ? ?

## 2021-04-19 ENCOUNTER — Encounter (HOSPITAL_COMMUNITY): Payer: Self-pay | Admitting: Physical Therapy

## 2021-04-19 ENCOUNTER — Ambulatory Visit (HOSPITAL_COMMUNITY): Payer: Medicaid Other | Admitting: Physical Therapy

## 2021-04-19 ENCOUNTER — Other Ambulatory Visit: Payer: Self-pay

## 2021-04-19 DIAGNOSIS — M546 Pain in thoracic spine: Secondary | ICD-10-CM

## 2021-04-19 DIAGNOSIS — M6281 Muscle weakness (generalized): Secondary | ICD-10-CM

## 2021-04-19 DIAGNOSIS — R262 Difficulty in walking, not elsewhere classified: Secondary | ICD-10-CM

## 2021-04-19 DIAGNOSIS — M545 Low back pain, unspecified: Secondary | ICD-10-CM

## 2021-04-19 DIAGNOSIS — R2681 Unsteadiness on feet: Secondary | ICD-10-CM

## 2021-04-19 DIAGNOSIS — M5104 Intervertebral disc disorders with myelopathy, thoracic region: Secondary | ICD-10-CM | POA: Diagnosis not present

## 2021-04-19 DIAGNOSIS — R29898 Other symptoms and signs involving the musculoskeletal system: Secondary | ICD-10-CM

## 2021-04-19 NOTE — Patient Instructions (Signed)
Mid-Thoracic Spine Mobilization with Taped Tennis Balls Shoulder Flexion at Wall - 3-4 x daily - 7 x weekly - 76mn hold ?

## 2021-04-19 NOTE — Therapy (Signed)
[place], [place], [address] Phone: [phone]   Fax:  [phone]  Physical Therapy Treatment  Patient Details  Name: Sandra Gray MRN: [id number] Date of Birth: [birth year] Referring Provider (PT): Earnie Larsson, MD   Encounter Date: 04/19/2021   PT End of Session - 04/19/21 0751     Visit Number 5    Number of Visits 8    Date for PT Re-Evaluation 05/09/21    Authorization Type [place] Medicaid UHC    Authorization Time Period No Auth Req(27 visits between PT/OT/ST)    Authorization - Visit Number 5    Authorization - Number of Visits 27    Progress Note Due on Visit 10    PT Start Time 0738    PT Stop Time 0812    PT Time Calculation (min) 34 min    Activity Tolerance Patient limited by pain;Patient tolerated treatment well;No increased pain    Behavior During Therapy [facility] for tasks assessed/performed             Past Medical History:  Diagnosis Date   Anemia    Anxiety    Asthma 02/12/2006   Bronchitis    Chronic kidney disease    "declining kidney, but it's stable right now"   Depression    Diabetes mellitus without complication ([place])    Fibroids    Hypertension 02/13/2011   Started on lisinopril   Pneumonia    Pt reports she had pneumonia at 51 years of age    Past Surgical History:  Procedure Laterality Date   DILATION AND CURETTAGE OF UTERUS  08/2011   HYSTEROSCOPY  02/15/2012   Fibroid Removal   HYSTEROSCOPY  may 2014   ROBOTIC ASSISTED SALPINGO OOPHERECTOMY Bilateral 07/28/2015   Procedure: XI ROBOTIC ASSISTED BILATERAL SALPINGO OOPHORECTOMY AND LYSIS OF ADHESIONS;  Surgeon: Everitt Amber, MD;  Location: [facility];  Service: Gynecology;  Laterality: Bilateral;   SUPRACERVICAL ABDOMINAL HYSTERECTOMY N/A 08/31/2014   Procedure: HYSTERECTOMY SUPRACERVICAL ABDOMINAL;  Surgeon: [name], MD;  Location: [facility];  Service: Gynecology;  Laterality: N/A;  Dr. Elonda Husky will assist   THORACIC  DISCECTOMY Left 09/13/2020   Procedure: Left Thoracic Eight-Nine, Thoracic Ten-Eleven Microdiscectomy;  Surgeon: Earnie Larsson, MD;  Location: [facility];  Service: Neurosurgery;  Laterality: Left;    There were no vitals filed for this visit.   Subjective Assessment - 04/19/21 0757     Subjective Patient reports that the exercises she completed the last session were effective in reducing her thoracic pain.    Pertinent History Left Microdiscectomy - left - T8-T9 - T10-T11 (performed 09/13/2020)    Patient Stated Goals "Be normal, walk without AD."    Currently in Pain? Yes    Pain Score 4     Pain Location Back    Pain Orientation Left;Mid    Pain Onset More than a month ago                               [facility] Adult PT Treatment/Exercise - 04/19/21 0001       Exercises   Exercises Shoulder      Lumbar Exercises: Standing   Row Other (comment)   Red 2x10   Shoulder Extension Other (comment)   Red 2x10     Lumbar Exercises: Seated   Other Seated Lumbar Exercises Tennis/pillowcase self massage x44mn      Shoulder Exercises: Seated  Other Seated Exercises Armbike(lvl2) x31mn each      Shoulder Exercises: Standing   Other Standing Exercises Banded(red) shoulder adduction 2x10 each                       PT Short Term Goals - 03/14/21 1547       PT SHORT TERM GOAL #1   Title Patient will be able to complete 5xSTS in <22s    Time 4    Period Weeks    Status New    Target Date 04/11/21      PT SHORT TERM GOAL #2   Title Patient will achieve a bilateral hip flexion MMT of at least 4/5 and without onset of spine pain greater than a 3/10.    Time 4    Period Weeks    Status New    Target Date 04/11/21      PT SHORT TERM GOAL #3   Title Patient will be able to ambulate at least 2271fwith an AD before an onset of spine pain.    Baseline 16082f  Time 4    Period Weeks    Status New    Target Date 04/11/21               PT Long Term  Goals - 03/14/21 1550       PT LONG TERM GOAL #1   Title Patient will be able to complete 5xSTS in <13s    Time 8    Period Weeks    Status New    Target Date 05/09/21      PT LONG TERM GOAL #2   Title Patient will achieve a bilateral hip flexion MMT of at least 4+/5 and without onset of spine pain greater than a 1/10.    Time 8    Period Weeks    Status New    Target Date 05/09/21      PT LONG TERM GOAL #3   Title Patient will be able to ambulate at least 500f55fthout the use of an AD or onset of spine pain greater 1/10.    Time 8    Period Weeks    Status New    Target Date 05/09/21                   Plan - 04/19/21 0814     Clinical Impression Statement Patient tolerated treatment very well during today's session and responded favorably toward the self massage with tennis ball. She did have a preference for lateral motions with the tennis ball and tended to avoid vertical motions. Exercise form during both the row and shoulder extension exercises was well maintained and only one verbal cue was needed for correcting the end position of the row. No adjustments to resistance were made, but overall rep count was increased.    Personal Factors and Comorbidities Behavior Pattern;Time since onset of injury/illness/exacerbation;Past/Current Experience    Examination-Activity Limitations Bed Mobility;Bend;Carry;Lift;Stand;Stairs;Squat;Reach Overhead;Locomotion Level;Transfers    Examination-Participation Restrictions Church;Cleaning;Community Activity;Laundry;Yard Work;Shop;Occupation;Meal Prep    Stability/Clinical Decision Making Evolving/Moderate complexity    Rehab Potential Fair    PT Frequency 1x / week    PT Duration 8 weeks    PT Treatment/Interventions ADLs/Self Care Home Management;Electrical Stimulation;DME Instruction;Ultrasound;Traction;Gait training;Stair training;Functional mobility training;Therapeutic activities;Therapeutic exercise;Balance  training;Patient/family education;Neuromuscular re-education;Manual techniques;Dry needling;Spinal Manipulations;Joint Manipulations    PT Next Visit Plan Begin SLS, vector stance.  Continue with very light spinal mobility program and add  exercises as tolerated.    PT Home Exercise Plan Eval: SKTC, LTR, cat/cow; 2/8: bridge, clam; 2/22: 3D hip excursin, prone SLR and sidelying abd; 3/1: tandem stance and side step    Consulted and Agree with Plan of Care Patient             Patient will benefit from skilled therapeutic intervention in order to improve the following deficits and impairments:  Decreased activity tolerance, Decreased balance, Decreased mobility, Decreased range of motion, Decreased strength, Difficulty walking, Impaired perceived functional ability, Postural dysfunction, Improper body mechanics, Pain, Abnormal gait  Visit Diagnosis: Herniated nucleus pulposus with myelopathy, thoracic  Pain in thoracic spine  Decreased muscle function  Difficulty in walking, not elsewhere classified  Acute low back pain, unspecified back pain laterality, unspecified whether sciatica present  Muscle weakness (generalized)  Unsteadiness on feet     Problem List Patient Active Problem List   Diagnosis Date Noted   Herniation of intervertebral disc of thoracic spine with myelopathy 09/13/2020   Mild intermittent asthma without complication 23/76/2831   Right thigh pain 03/31/2018   Leukocytosis 02/07/2018   Chronic pain of both knees 01/31/2018   Pain in both lower extremities 01/13/2018   Tachycardia 01/13/2018   Chronic right shoulder pain 10/18/2017   Right hand pain 10/18/2017   Hyperlipidemia 10/11/2017   Type 2 diabetes mellitus with complication, without long-term current use of insulin ([place]) 10/11/2017   Rash 10/25/2016   Hormone replacement therapy (HRT) 09/24/2016   Screening for breast cancer 04/25/2016   Chronic low back pain 04/25/2016   Pain of right thumb  12/16/2015   Cough 12/16/2015   Endometriosis of rectovaginal septum 06/14/2015   Endometriosis 02/16/2015   Endometriosis of pelvis 10/12/2014   Uterine fibroid 12/28/2013   Obesity (BMI 30-39.9) 11/30/2013   Hypertension 03/26/2012   Anxiety and depression 03/26/2012   Routine general medical examination at a health care facility 03/26/2012    Adalberto Cole, PT 04/19/2021, 8:17 AM  [facility] [place], [place], [address] Phone: [phone]   Fax:  [phone]  Name: Sandra Gray MRN: [id number] Date of Birth: 1970/11/30

## 2021-04-26 ENCOUNTER — Ambulatory Visit (HOSPITAL_COMMUNITY): Payer: Medicaid Other

## 2021-04-26 ENCOUNTER — Other Ambulatory Visit: Payer: Self-pay

## 2021-04-26 DIAGNOSIS — M545 Low back pain, unspecified: Secondary | ICD-10-CM

## 2021-04-26 DIAGNOSIS — M6281 Muscle weakness (generalized): Secondary | ICD-10-CM

## 2021-04-26 DIAGNOSIS — R262 Difficulty in walking, not elsewhere classified: Secondary | ICD-10-CM

## 2021-04-26 DIAGNOSIS — R29898 Other symptoms and signs involving the musculoskeletal system: Secondary | ICD-10-CM

## 2021-04-26 DIAGNOSIS — M546 Pain in thoracic spine: Secondary | ICD-10-CM

## 2021-04-26 DIAGNOSIS — R2681 Unsteadiness on feet: Secondary | ICD-10-CM

## 2021-04-26 DIAGNOSIS — M5104 Intervertebral disc disorders with myelopathy, thoracic region: Secondary | ICD-10-CM | POA: Diagnosis not present
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
## 2021-05-03 ENCOUNTER — Encounter (HOSPITAL_COMMUNITY): Payer: Self-pay | Admitting: Physical Therapy

## 2021-05-03 ENCOUNTER — Ambulatory Visit (HOSPITAL_COMMUNITY): Payer: Medicaid Other | Admitting: Physical Therapy

## 2021-05-03 ENCOUNTER — Other Ambulatory Visit: Payer: Self-pay

## 2021-05-03 DIAGNOSIS — R262 Difficulty in walking, not elsewhere classified: Secondary | ICD-10-CM

## 2021-05-03 DIAGNOSIS — M545 Low back pain, unspecified: Secondary | ICD-10-CM

## 2021-05-03 DIAGNOSIS — M6281 Muscle weakness (generalized): Secondary | ICD-10-CM

## 2021-05-03 DIAGNOSIS — M5104 Intervertebral disc disorders with myelopathy, thoracic region: Secondary | ICD-10-CM

## 2021-05-03 DIAGNOSIS — R2681 Unsteadiness on feet: Secondary | ICD-10-CM

## 2021-05-03 DIAGNOSIS — M546 Pain in thoracic spine: Secondary | ICD-10-CM

## 2021-05-03 DIAGNOSIS — R29898 Other symptoms and signs involving the musculoskeletal system: Secondary | ICD-10-CM
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
## 2021-05-10 ENCOUNTER — Encounter (HOSPITAL_COMMUNITY): Payer: Medicaid Other | Admitting: Physical Therapy

## 2021-06-07 DIAGNOSIS — I10 Essential (primary) hypertension: Secondary | ICD-10-CM | POA: Diagnosis not present

## 2021-06-07 DIAGNOSIS — E119 Type 2 diabetes mellitus without complications: Secondary | ICD-10-CM | POA: Diagnosis not present

## 2021-06-07 DIAGNOSIS — E785 Hyperlipidemia, unspecified: Secondary | ICD-10-CM | POA: Diagnosis not present

## 2021-06-13 DIAGNOSIS — E785 Hyperlipidemia, unspecified: Secondary | ICD-10-CM | POA: Diagnosis not present

## 2021-06-13 DIAGNOSIS — I10 Essential (primary) hypertension: Secondary | ICD-10-CM | POA: Diagnosis not present

## 2021-06-13 DIAGNOSIS — E119 Type 2 diabetes mellitus without complications: Secondary | ICD-10-CM | POA: Diagnosis not present

## 2021-06-13 DIAGNOSIS — F322 Major depressive disorder, single episode, severe without psychotic features: Secondary | ICD-10-CM | POA: Diagnosis not present

## 2021-06-13 DIAGNOSIS — Z79899 Other long term (current) drug therapy: Secondary | ICD-10-CM | POA: Diagnosis not present

## 2021-06-13 DIAGNOSIS — Z6841 Body Mass Index (BMI) 40.0 and over, adult: Secondary | ICD-10-CM | POA: Diagnosis not present

## 2021-06-13 DIAGNOSIS — M545 Low back pain, unspecified: Secondary | ICD-10-CM | POA: Diagnosis not present

## 2021-06-13 DIAGNOSIS — G4700 Insomnia, unspecified: Secondary | ICD-10-CM | POA: Diagnosis not present

## 2021-06-13 DIAGNOSIS — M6281 Muscle weakness (generalized): Secondary | ICD-10-CM | POA: Diagnosis not present

## 2021-06-14 ENCOUNTER — Ambulatory Visit (HOSPITAL_COMMUNITY): Payer: Medicaid Other | Attending: Neurosurgery

## 2021-06-14 DIAGNOSIS — M546 Pain in thoracic spine: Secondary | ICD-10-CM

## 2021-06-14 DIAGNOSIS — R29898 Other symptoms and signs involving the musculoskeletal system: Secondary | ICD-10-CM | POA: Diagnosis present

## 2021-06-14 DIAGNOSIS — M6281 Muscle weakness (generalized): Secondary | ICD-10-CM

## 2021-06-14 DIAGNOSIS — M545 Low back pain, unspecified: Secondary | ICD-10-CM

## 2021-06-14 DIAGNOSIS — R2681 Unsteadiness on feet: Secondary | ICD-10-CM

## 2021-06-14 DIAGNOSIS — R262 Difficulty in walking, not elsewhere classified: Secondary | ICD-10-CM | POA: Diagnosis present

## 2021-06-14 DIAGNOSIS — M5104 Intervertebral disc disorders with myelopathy, thoracic region: Secondary | ICD-10-CM
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
## 2021-07-11 ENCOUNTER — Encounter (HOSPITAL_COMMUNITY): Payer: Medicaid Other

## 2021-07-18 ENCOUNTER — Telehealth (HOSPITAL_COMMUNITY): Payer: Self-pay

## 2021-07-18 ENCOUNTER — Encounter (HOSPITAL_COMMUNITY): Payer: Self-pay

## 2021-07-18 ENCOUNTER — Ambulatory Visit (HOSPITAL_COMMUNITY): Payer: Medicaid Other | Attending: Neurosurgery

## 2021-07-18 DIAGNOSIS — M6281 Muscle weakness (generalized): Secondary | ICD-10-CM | POA: Diagnosis present

## 2021-07-18 DIAGNOSIS — M546 Pain in thoracic spine: Secondary | ICD-10-CM

## 2021-07-18 DIAGNOSIS — R2681 Unsteadiness on feet: Secondary | ICD-10-CM | POA: Diagnosis present

## 2021-07-18 DIAGNOSIS — M5104 Intervertebral disc disorders with myelopathy, thoracic region: Secondary | ICD-10-CM

## 2021-07-18 DIAGNOSIS — R262 Difficulty in walking, not elsewhere classified: Secondary | ICD-10-CM | POA: Diagnosis present

## 2021-07-18 DIAGNOSIS — R29898 Other symptoms and signs involving the musculoskeletal system: Secondary | ICD-10-CM

## 2021-07-18 DIAGNOSIS — M545 Low back pain, unspecified: Secondary | ICD-10-CM

## 2021-07-18 NOTE — Telephone Encounter (Signed)
Left message on voice mail concerning attendance and if pt plans to attend as is currently out of cert.  Has attended 1 time since March.  Included number to return call.  Ihor Austin, LPTA/CLT; Delana Meyer [phone]
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
## 2021-07-20 ENCOUNTER — Ambulatory Visit (HOSPITAL_COMMUNITY): Payer: Medicaid Other | Attending: Neurosurgery

## 2021-07-20 ENCOUNTER — Encounter (HOSPITAL_COMMUNITY): Payer: Self-pay

## 2021-07-20 DIAGNOSIS — M5104 Intervertebral disc disorders with myelopathy, thoracic region: Secondary | ICD-10-CM | POA: Diagnosis present

## 2021-07-20 DIAGNOSIS — R2681 Unsteadiness on feet: Secondary | ICD-10-CM

## 2021-07-20 DIAGNOSIS — R262 Difficulty in walking, not elsewhere classified: Secondary | ICD-10-CM | POA: Insufficient documentation

## 2021-07-20 DIAGNOSIS — M6281 Muscle weakness (generalized): Secondary | ICD-10-CM | POA: Diagnosis not present

## 2021-07-20 DIAGNOSIS — M546 Pain in thoracic spine: Secondary | ICD-10-CM

## 2021-07-20 DIAGNOSIS — M545 Low back pain, unspecified: Secondary | ICD-10-CM

## 2021-07-20 DIAGNOSIS — R29898 Other symptoms and signs involving the musculoskeletal system: Secondary | ICD-10-CM
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
## 2021-07-25 ENCOUNTER — Encounter (HOSPITAL_COMMUNITY): Payer: Self-pay

## 2021-07-25 ENCOUNTER — Ambulatory Visit (HOSPITAL_COMMUNITY): Payer: Medicaid Other | Attending: Family Medicine

## 2021-07-25 DIAGNOSIS — M5104 Intervertebral disc disorders with myelopathy, thoracic region: Secondary | ICD-10-CM | POA: Insufficient documentation

## 2021-07-25 DIAGNOSIS — M546 Pain in thoracic spine: Secondary | ICD-10-CM

## 2021-07-25 DIAGNOSIS — M6281 Muscle weakness (generalized): Secondary | ICD-10-CM | POA: Insufficient documentation

## 2021-07-25 DIAGNOSIS — R262 Difficulty in walking, not elsewhere classified: Secondary | ICD-10-CM | POA: Diagnosis not present

## 2021-07-25 DIAGNOSIS — M545 Low back pain, unspecified: Secondary | ICD-10-CM | POA: Insufficient documentation

## 2021-07-25 DIAGNOSIS — R2681 Unsteadiness on feet: Secondary | ICD-10-CM
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
## 2021-08-01 ENCOUNTER — Ambulatory Visit (HOSPITAL_COMMUNITY): Payer: Medicaid Other | Attending: Family Medicine

## 2021-08-01 DIAGNOSIS — M6281 Muscle weakness (generalized): Secondary | ICD-10-CM | POA: Diagnosis not present

## 2021-08-01 DIAGNOSIS — M5104 Intervertebral disc disorders with myelopathy, thoracic region: Secondary | ICD-10-CM | POA: Insufficient documentation

## 2021-08-01 DIAGNOSIS — M546 Pain in thoracic spine: Secondary | ICD-10-CM

## 2021-08-01 DIAGNOSIS — R262 Difficulty in walking, not elsewhere classified: Secondary | ICD-10-CM | POA: Insufficient documentation
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
## 2021-08-04 ENCOUNTER — Other Ambulatory Visit: Payer: Self-pay | Admitting: Neurosurgery

## 2021-08-04 ENCOUNTER — Other Ambulatory Visit (HOSPITAL_COMMUNITY): Payer: Self-pay | Admitting: Neurosurgery

## 2021-08-04 DIAGNOSIS — M546 Pain in thoracic spine: Secondary | ICD-10-CM

## 2021-08-08 ENCOUNTER — Encounter (HOSPITAL_COMMUNITY): Payer: Medicaid Other

## 2021-08-10 ENCOUNTER — Ambulatory Visit (HOSPITAL_BASED_OUTPATIENT_CLINIC_OR_DEPARTMENT_OTHER)
Admission: RE | Admit: 2021-08-10 | Discharge: 2021-08-10 | Disposition: A | Discharge status: Home / Self Care | Payer: Medicaid Other | Source: Ambulatory Visit | Attending: Neurosurgery | Admitting: Neurosurgery

## 2021-08-10 ENCOUNTER — Encounter (HOSPITAL_COMMUNITY): Payer: Medicaid Other

## 2021-08-10 DIAGNOSIS — M546 Pain in thoracic spine: Secondary | ICD-10-CM | POA: Diagnosis present

## 2021-08-11 ENCOUNTER — Other Ambulatory Visit (HOSPITAL_COMMUNITY): Payer: Medicaid Other

## 2021-09-14 ENCOUNTER — Other Ambulatory Visit (HOSPITAL_COMMUNITY): Payer: Self-pay | Admitting: Neurosurgery

## 2021-09-14 ENCOUNTER — Other Ambulatory Visit: Payer: Self-pay | Admitting: Neurosurgery

## 2021-09-14 DIAGNOSIS — M5137 Other intervertebral disc degeneration, lumbosacral region: Secondary | ICD-10-CM

## 2021-09-15 ENCOUNTER — Other Ambulatory Visit (HOSPITAL_COMMUNITY): Payer: Self-pay | Admitting: Neurosurgery

## 2021-09-15 ENCOUNTER — Ambulatory Visit (HOSPITAL_COMMUNITY)
Admission: RE | Admit: 2021-09-15 | Discharge: 2021-09-15 | Disposition: A | Discharge status: Home / Self Care | Payer: Medicare Other | Source: Ambulatory Visit | Attending: Family Medicine | Admitting: Family Medicine

## 2021-09-15 ENCOUNTER — Other Ambulatory Visit (HOSPITAL_COMMUNITY): Payer: Self-pay | Admitting: Family Medicine

## 2021-09-15 DIAGNOSIS — I10 Essential (primary) hypertension: Secondary | ICD-10-CM

## 2021-09-15 DIAGNOSIS — M5137 Other intervertebral disc degeneration, lumbosacral region: Secondary | ICD-10-CM

## 2021-09-15 DIAGNOSIS — Z136 Encounter for screening for cardiovascular disorders: Secondary | ICD-10-CM | POA: Diagnosis not present

## 2021-09-20 ENCOUNTER — Other Ambulatory Visit (HOSPITAL_COMMUNITY): Payer: Self-pay | Admitting: Family Medicine

## 2021-09-20 DIAGNOSIS — N6320 Unspecified lump in the left breast, unspecified quadrant: Secondary | ICD-10-CM

## 2021-09-30 ENCOUNTER — Ambulatory Visit (HOSPITAL_COMMUNITY)
Admission: RE | Admit: 2021-09-30 | Discharge: 2021-09-30 | Disposition: A | Discharge status: Home / Self Care | Payer: Medicare Other | Source: Ambulatory Visit | Attending: Neurosurgery | Admitting: Neurosurgery

## 2021-09-30 DIAGNOSIS — M5137 Other intervertebral disc degeneration, lumbosacral region: Secondary | ICD-10-CM | POA: Diagnosis present

## 2021-10-31 ENCOUNTER — Other Ambulatory Visit (HOSPITAL_COMMUNITY): Payer: Medicare Other

## 2021-10-31 ENCOUNTER — Encounter (HOSPITAL_COMMUNITY): Payer: Self-pay

## 2021-10-31 ENCOUNTER — Encounter (HOSPITAL_COMMUNITY): Payer: Medicare Other

## 2022-03-21 ENCOUNTER — Other Ambulatory Visit (HOSPITAL_COMMUNITY): Payer: Self-pay | Admitting: Family Medicine

## 2022-03-21 DIAGNOSIS — Z1231 Encounter for screening mammogram for malignant neoplasm of breast: Secondary | ICD-10-CM

## 2022-03-22 ENCOUNTER — Ambulatory Visit (HOSPITAL_COMMUNITY)
Admission: RE | Admit: 2022-03-22 | Discharge: 2022-03-22 | Disposition: A | Discharge status: Home / Self Care | Payer: Medicare (Managed Care) | Source: Ambulatory Visit | Attending: Family Medicine | Admitting: Family Medicine

## 2022-03-22 DIAGNOSIS — Z1231 Encounter for screening mammogram for malignant neoplasm of breast: Secondary | ICD-10-CM | POA: Diagnosis not present

## 2022-04-12 ENCOUNTER — Encounter: Payer: Self-pay | Admitting: Radiology

## 2022-10-07 ENCOUNTER — Ambulatory Visit: Payer: Medicare (Managed Care)

## 2022-10-07 ENCOUNTER — Other Ambulatory Visit: Payer: Self-pay

## 2022-10-07 ENCOUNTER — Encounter: Payer: Self-pay | Admitting: Emergency Medicine

## 2022-10-07 ENCOUNTER — Ambulatory Visit
Admission: EM | Admit: 2022-10-07 | Discharge: 2022-10-07 | Disposition: A | Discharge status: Home / Self Care | Payer: Medicaid Other

## 2022-10-07 DIAGNOSIS — M25571 Pain in right ankle and joints of right foot: Secondary | ICD-10-CM | POA: Diagnosis not present

## 2022-10-07 MED ORDER — NAPROXEN 500 MG PO TABS: 500.0000 mg | ORAL_TABLET | Freq: Two times a day (BID) | ORAL | 0 refills | Status: AC | PRN

## 2022-10-07 NOTE — Discharge Instructions (Signed)
Your x-ray today did not show any fractures.  Use compression, ice, elevation, anti-inflammatory pain medication such as the one that I have sent over to the pharmacy, Tylenol.  Follow-up with your orthopedist for a recheck if it is still bothering you.

## 2022-10-07 NOTE — ED Triage Notes (Addendum)
Right ankle pain for "awhile" and reports intermittent swelling and "shooting pain". Denies any known injury.

## 2022-10-10 NOTE — ED Provider Notes (Signed)
[facility]    CSN: [id number] Arrival date & time: 10/07/22  7564      History   Chief Complaint Chief Complaint  Patient presents with   Ankle Pain    HPI Sandra Gray is a 52 y.o. female.   Patient presenting today with diffuse right ankle pain, intermittent swelling and shooting pains up the anterior leg off and on for the past few months.  Denies any known injury prior to onset, numbness, tingling, loss of range of motion.  So far tried over-the-counter pain relievers, braces with no relief.    Past Medical History:  Diagnosis Date   Anemia    Anxiety    Asthma 02/12/2006   Bronchitis    Chronic kidney disease    "declining kidney, but it's stable right now"   Depression    Diabetes mellitus without complication (HCC)    Fibroids    Hypertension 02/13/2011   Started on lisinopril   Pneumonia    Pt reports she had pneumonia at 52 years of age    Patient Active Problem List   Diagnosis Date Noted   Herniation of intervertebral disc of thoracic spine with myelopathy 09/13/2020   Mild intermittent asthma without complication 06/07/2020   Right thigh pain 03/31/2018   Leukocytosis 02/07/2018   Chronic pain of both knees 01/31/2018   Pain in both lower extremities 01/13/2018   Tachycardia 01/13/2018   Chronic right shoulder pain 10/18/2017   Right hand pain 10/18/2017   Hyperlipidemia 10/11/2017   Type 2 diabetes mellitus with complication, without long-term current use of insulin (HCC) 10/11/2017   Rash 10/25/2016   Hormone replacement therapy (HRT) 09/24/2016   Screening for breast cancer 04/25/2016   Chronic low back pain 04/25/2016   Pain of right thumb 12/16/2015   Cough 12/16/2015   Endometriosis of rectovaginal septum 06/14/2015   Endometriosis 02/16/2015   Endometriosis of pelvis 10/12/2014   Uterine fibroid 12/28/2013   Obesity (BMI 30-39.9) 11/30/2013   Hypertension 03/26/2012   Anxiety and depression 03/26/2012   Routine  general medical examination at a health care facility 03/26/2012    Past Surgical History:  Procedure Laterality Date   DILATION AND CURETTAGE OF UTERUS  08/2011   HYSTEROSCOPY  02/15/2012   Fibroid Removal   HYSTEROSCOPY  may 2014   ROBOTIC ASSISTED SALPINGO OOPHERECTOMY Bilateral 07/28/2015   Procedure: XI ROBOTIC ASSISTED BILATERAL SALPINGO OOPHORECTOMY AND LYSIS OF ADHESIONS;  Surgeon: Adolphus Birchwood, MD;  Location: [facility];  Service: Gynecology;  Laterality: Bilateral;   SUPRACERVICAL ABDOMINAL HYSTERECTOMY N/A 08/31/2014   Procedure: HYSTERECTOMY SUPRACERVICAL ABDOMINAL;  Surgeon: Tilda Burrow, MD;  Location: [facility];  Service: Gynecology;  Laterality: N/A;  Dr. Despina Hidden will assist   THORACIC DISCECTOMY Left 09/13/2020   Procedure: Left Thoracic Eight-Nine, Thoracic Ten-Eleven Microdiscectomy;  Surgeon: Julio Sicks, MD;  Location: [facility];  Service: Neurosurgery;  Laterality: Left;    OB History     Gravida  2   Para  1   Term  1   Preterm      AB  1   Living  1      SAB  1   IAB      Ectopic      Multiple      Live Births  1            Home Medications    Prior to Admission medications   Medication Sig Start Date End Date Taking? Authorizing Provider  metoprolol tartrate (  LOPRESSOR) 25 MG tablet Take 12.5 mg by mouth 2 (two) times daily.   Yes [provider]  naproxen (NAPROSYN) 500 MG tablet Take 1 tablet (500 mg total) by mouth 2 (two) times daily as needed. 10/07/22  Yes Particia Nearing, PA-C  acetaminophen (TYLENOL 8 HOUR) 650 MG CR tablet Take 1 tablet (650 mg total) by mouth every 8 (eight) hours as needed for pain or fever. Patient not taking: Reported on 09/06/2020 05/29/20   Derwood Kaplan, MD  acetaminophen (TYLENOL) 500 MG tablet Take 1,000 mg by mouth every 6 (six) hours as needed for moderate pain or mild pain.    [provider]  albuterol (PROVENTIL) (2.5 MG/3ML) 0.083% nebulizer solution Take 3 mLs (2.5 mg total) by  nebulization every 6 (six) hours as needed for wheezing or shortness of breath. 12/16/15   Glori Luis, MD  albuterol (VENTOLIN HFA) 108 (90 Base) MCG/ACT inhaler Inhale 2 puffs into the lungs every 6 (six) hours as needed for wheezing. 06/07/20   Flinchum, Eula Fried, FNP  amLODipine (NORVASC) 5 MG tablet Take 1 tablet by mouth once daily Patient taking differently: Take 5 mg by mouth at bedtime. 08/22/20   Allegra Grana, FNP  chlorhexidine (PERIDEX) 0.12 % solution 15 mLs by Mouth Rinse route 2 (two) times daily. 08/23/20   [provider]  Cholecalciferol 10 MCG (400 UNIT) CAPS Take 2 capsules (800 Units total) by mouth daily. Patient taking differently: Take 400 Units by mouth daily. 02/04/20   Allegra Grana, FNP  cyclobenzaprine (FLEXERIL) 10 MG tablet Take 1 tablet (10 mg total) by mouth 3 (three) times daily as needed for muscle spasms. 09/14/20   Julio Sicks, MD  cyclobenzaprine (FLEXERIL) 5 MG tablet Take 1 tablet (5 mg total) by mouth at bedtime as needed for muscle spasms. Patient taking differently: Take 5 mg by mouth daily as needed for muscle spasms. 06/07/20   Flinchum, Eula Fried, FNP  fluticasone (FLOVENT HFA) 110 MCG/ACT inhaler Inhale 1 puff into the lungs 2 (two) times daily. Patient taking differently: Inhale 1 puff into the lungs daily as needed (Asthma). 12/22/15   Glori Luis, MD  gabapentin (NEURONTIN) 100 MG capsule TAKE 3 CAPSULES BY MOUTH AT BEDTIME Patient taking differently: Take 200 mg by mouth at bedtime. 05/01/18   Allegra Grana, FNP  HYDROcodone-acetaminophen (NORCO/VICODIN) 5-325 MG tablet Take 1-2 tablets by mouth every 4 (four) hours as needed for moderate pain ((score 4 to 6)). 09/14/20   Julio Sicks, MD  lisinopril (ZESTRIL) 5 MG tablet Take 1 tablet by mouth once daily Patient taking differently: Take 5 mg by mouth at bedtime. 06/20/20   Allegra Grana, FNP  metFORMIN (GLUCOPHAGE XR) 500 MG 24 hr tablet Take 4 tablets (2,000 mg  total) by mouth every evening. Patient taking differently: Take 500 mg by mouth 2 (two) times daily. 04/12/20   Allegra Grana, FNP  naproxen (NAPROSYN) 375 MG tablet Take 1 tablet (375 mg total) by mouth 2 (two) times daily. Patient not taking: Reported on 09/06/2020 05/29/20   Derwood Kaplan, MD  rosuvastatin (CRESTOR) 20 MG tablet Take 1 tablet (20 mg total) by mouth daily. Patient taking differently: Take 20 mg by mouth at bedtime. 02/04/20   Allegra Grana, FNP  traZODone (DESYREL) 50 MG tablet Take 50 mg by mouth at bedtime. 07/26/20   [provider]    Family History Family History  Problem Relation Age of Onset   Hypertension Mother  Hypertension Father    Diabetes Father    Kidney disease Maternal Grandmother    Cancer Maternal Grandfather        Lung - Asbestos exposure   Diabetes Paternal Grandmother    Heart disease Paternal Grandfather    Breast cancer Neg Hx     Social History Social History   Tobacco Use   Smoking status: Never   Smokeless tobacco: Never  Vaping Use   Vaping status: Never Used  Substance Use Topics   Alcohol use: Not Currently    Alcohol/week: 1.0 standard drink of alcohol    Types: 1 Standard drinks or equivalent per week   Drug use: No     Allergies   Cymbalta [duloxetine hcl] and Gadolinium derivatives   Review of Systems Review of Systems PER HPI  Physical Exam Triage Vital Signs ED Triage Vitals  Encounter Vitals Group     BP 10/07/22 0829 130/88     Systolic BP Percentile --      Diastolic BP Percentile --      Pulse Rate 10/07/22 0829 95     Resp 10/07/22 0829 20     Temp 10/07/22 0829 98.7 F (37.1 C)     Temp Source 10/07/22 0829 Oral     SpO2 10/07/22 0829 96 %     Weight --      Height --      Head Circumference --      Peak Flow --      Pain Score 10/07/22 0826 10     Pain Loc --      Pain Education --      Exclude from Growth Chart --    No data found.  Updated Vital Signs BP 130/88  (BP Location: Right Arm)   Pulse 95   Temp 98.7 F (37.1 C) (Oral)   Resp 20   LMP 08/02/2014   SpO2 96%   Visual Acuity Right Eye Distance:   Left Eye Distance:   Bilateral Distance:    Right Eye Near:   Left Eye Near:    Bilateral Near:     Physical Exam Vitals and nursing note reviewed.  Constitutional:      Appearance: Normal appearance. She is not ill-appearing.  HENT:     Head: Atraumatic.  Eyes:     Extraocular Movements: Extraocular movements intact.     Conjunctiva/sclera: Conjunctivae normal.  Cardiovascular:     Rate and Rhythm: Normal rate and regular rhythm.     Heart sounds: Normal heart sounds.  Pulmonary:     Effort: Pulmonary effort is normal.     Breath sounds: Normal breath sounds.  Musculoskeletal:        General: Swelling and tenderness present. No deformity or signs of injury. Normal range of motion.     Cervical back: Normal range of motion and neck supple.     Comments: Anterior right ankle tender to palpation, trace edema.  Range of motion intact but painful.  No bony deformities palpable  Skin:    General: Skin is warm and dry.     Findings: No bruising or erythema.  Neurological:     Mental Status: She is alert and oriented to person, place, and time.     Motor: No weakness.     Gait: Gait normal.     Comments: Right lower extremity neurovascular intact  Psychiatric:        Mood and Affect: Mood normal.  Thought Content: Thought content normal.        Judgment: Judgment normal.      UC Treatments / Results  Labs (all labs ordered are listed, but only abnormal results are displayed) Labs Reviewed - No data to display  EKG   Radiology No results found.  Procedures Procedures (including critical care time)  Medications Ordered in UC Medications - No data to display  Initial Impression / Assessment and Plan / UC Course  I have reviewed the triage vital signs and the nursing notes.  Pertinent labs & imaging results  that were available during my care of the patient were reviewed by me and considered in my medical decision making (see chart for details).     X-ray negative for any acute bony abnormalities today, suspect inflammatory arthritic type flare.  Treat with naproxen, Ace wrap, RICE protocol.  Return for worsening symptoms.  Final Clinical Impressions(s) / UC Diagnoses   Final diagnoses:  Acute right ankle pain     Discharge Instructions      Your x-ray today did not show any fractures.  Use compression, ice, elevation, anti-inflammatory pain medication such as the one that I have sent over to the pharmacy, Tylenol.  Follow-up with your orthopedist for a recheck if it is still bothering you.    ED Prescriptions     Medication Sig Dispense Auth. Provider   naproxen (NAPROSYN) 500 MG tablet Take 1 tablet (500 mg total) by mouth 2 (two) times daily as needed. 30 tablet Particia Nearing, [place]      PDMP not reviewed this encounter.   Roosvelt Maser [place], [place] 10/10/22 [phone]

## 2022-12-31 ENCOUNTER — Other Ambulatory Visit (HOSPITAL_COMMUNITY): Payer: Self-pay | Admitting: Neurosurgery

## 2022-12-31 DIAGNOSIS — M5104 Intervertebral disc disorders with myelopathy, thoracic region: Secondary | ICD-10-CM

## 2023-01-03 ENCOUNTER — Other Ambulatory Visit (HOSPITAL_COMMUNITY): Payer: Self-pay | Admitting: Neurosurgery

## 2023-01-03 DIAGNOSIS — M5416 Radiculopathy, lumbar region: Secondary | ICD-10-CM

## 2023-01-03 DIAGNOSIS — M5104 Intervertebral disc disorders with myelopathy, thoracic region: Secondary | ICD-10-CM

## 2023-01-11 ENCOUNTER — Ambulatory Visit (HOSPITAL_BASED_OUTPATIENT_CLINIC_OR_DEPARTMENT_OTHER): Admission: RE | Admit: 2023-01-11 | Payer: Medicare (Managed Care) | Source: Ambulatory Visit

## 2023-01-11 ENCOUNTER — Ambulatory Visit (HOSPITAL_BASED_OUTPATIENT_CLINIC_OR_DEPARTMENT_OTHER): Payer: Medicare (Managed Care)

## 2023-01-11 ENCOUNTER — Encounter (HOSPITAL_BASED_OUTPATIENT_CLINIC_OR_DEPARTMENT_OTHER): Payer: Self-pay

## 2023-02-27 ENCOUNTER — Other Ambulatory Visit (HOSPITAL_COMMUNITY): Payer: Self-pay | Admitting: Family Medicine

## 2023-02-27 DIAGNOSIS — Z1231 Encounter for screening mammogram for malignant neoplasm of breast: Secondary | ICD-10-CM

## 2023-03-25 ENCOUNTER — Encounter (HOSPITAL_COMMUNITY): Payer: Self-pay

## 2023-03-25 ENCOUNTER — Ambulatory Visit (HOSPITAL_COMMUNITY)
Admission: RE | Admit: 2023-03-25 | Discharge: 2023-03-25 | Disposition: A | Discharge status: Home / Self Care | Payer: Medicare (Managed Care) | Source: Ambulatory Visit | Attending: Family Medicine | Admitting: Family Medicine

## 2023-03-25 DIAGNOSIS — Z1231 Encounter for screening mammogram for malignant neoplasm of breast: Secondary | ICD-10-CM | POA: Diagnosis present
